# Patient Record
Sex: Female | Born: 1940 | Race: Black or African American | Hispanic: No | Smoking: Former smoker
Health system: Southern US, Community
[De-identification: ages and names within clinical notes are randomized; demographics above are authoritative.]

## PROBLEM LIST (undated history)

## (undated) DIAGNOSIS — M1990 Unspecified osteoarthritis, unspecified site: Secondary | ICD-10-CM

## (undated) DIAGNOSIS — E119 Type 2 diabetes mellitus without complications: Secondary | ICD-10-CM

## (undated) DIAGNOSIS — M545 Low back pain, unspecified: Secondary | ICD-10-CM

## (undated) DIAGNOSIS — H8110 Benign paroxysmal vertigo, unspecified ear: Secondary | ICD-10-CM

## (undated) DIAGNOSIS — Z9289 Personal history of other medical treatment: Secondary | ICD-10-CM

## (undated) DIAGNOSIS — C50919 Malignant neoplasm of unspecified site of unspecified female breast: Secondary | ICD-10-CM

## (undated) DIAGNOSIS — J42 Unspecified chronic bronchitis: Secondary | ICD-10-CM

## (undated) DIAGNOSIS — M8580 Other specified disorders of bone density and structure, unspecified site: Secondary | ICD-10-CM

## (undated) DIAGNOSIS — E785 Hyperlipidemia, unspecified: Secondary | ICD-10-CM

## (undated) DIAGNOSIS — J189 Pneumonia, unspecified organism: Secondary | ICD-10-CM

## (undated) DIAGNOSIS — D649 Anemia, unspecified: Secondary | ICD-10-CM

## (undated) DIAGNOSIS — R0602 Shortness of breath: Secondary | ICD-10-CM

## (undated) DIAGNOSIS — I10 Essential (primary) hypertension: Secondary | ICD-10-CM

## (undated) DIAGNOSIS — Z8719 Personal history of other diseases of the digestive system: Secondary | ICD-10-CM

## (undated) DIAGNOSIS — G8929 Other chronic pain: Secondary | ICD-10-CM

## (undated) DIAGNOSIS — I509 Heart failure, unspecified: Secondary | ICD-10-CM

## (undated) DIAGNOSIS — K219 Gastro-esophageal reflux disease without esophagitis: Secondary | ICD-10-CM

## (undated) DIAGNOSIS — Z9981 Dependence on supplemental oxygen: Secondary | ICD-10-CM

## (undated) DIAGNOSIS — G4730 Sleep apnea, unspecified: Secondary | ICD-10-CM

## (undated) DIAGNOSIS — N3941 Urge incontinence: Secondary | ICD-10-CM

## (undated) DIAGNOSIS — I499 Cardiac arrhythmia, unspecified: Secondary | ICD-10-CM

## (undated) DIAGNOSIS — S52509A Unspecified fracture of the lower end of unspecified radius, initial encounter for closed fracture: Secondary | ICD-10-CM

## (undated) DIAGNOSIS — E559 Vitamin D deficiency, unspecified: Secondary | ICD-10-CM

## (undated) DIAGNOSIS — G43909 Migraine, unspecified, not intractable, without status migrainosus: Secondary | ICD-10-CM

## (undated) HISTORY — DX: Urge incontinence: N39.41

## (undated) HISTORY — PX: TOTAL KNEE ARTHROPLASTY: SHX125

## (undated) HISTORY — PX: JOINT REPLACEMENT: SHX530

## (undated) HISTORY — DX: Gastro-esophageal reflux disease without esophagitis: K21.9

## (undated) HISTORY — PX: MEDIAL PARTIAL KNEE REPLACEMENT: SHX5965

## (undated) HISTORY — PX: TUBAL LIGATION: SHX77

## (undated) HISTORY — DX: Essential (primary) hypertension: I10

## (undated) HISTORY — PX: KNEE ARTHROSCOPY: SUR90

## (undated) HISTORY — DX: Hyperlipidemia, unspecified: E78.5

## (undated) HISTORY — DX: Sleep apnea, unspecified: G47.30

## (undated) HISTORY — DX: Vitamin D deficiency, unspecified: E55.9

## (undated) HISTORY — DX: Cardiac arrhythmia, unspecified: I49.9

## (undated) HISTORY — DX: Malignant neoplasm of unspecified site of unspecified female breast: C50.919

## (undated) HISTORY — DX: Benign paroxysmal vertigo, unspecified ear: H81.10

## (undated) HISTORY — PX: CATARACT EXTRACTION W/ INTRAOCULAR LENS  IMPLANT, BILATERAL: SHX1307

## (undated) HISTORY — DX: Other specified disorders of bone density and structure, unspecified site: M85.80

## (undated) HISTORY — DX: Anemia, unspecified: D64.9

---

## 1998-04-12 ENCOUNTER — Other Ambulatory Visit: Admission: RE | Admit: 1998-04-12 | Discharge: 1998-04-12 | Payer: Self-pay | Admitting: Family Medicine

## 1998-06-25 ENCOUNTER — Other Ambulatory Visit: Admission: RE | Admit: 1998-06-25 | Discharge: 1998-06-25 | Payer: Self-pay | Admitting: Family Medicine

## 1998-07-08 ENCOUNTER — Other Ambulatory Visit: Admission: RE | Admit: 1998-07-08 | Discharge: 1998-07-08 | Payer: Self-pay | Admitting: Family Medicine

## 1998-08-27 ENCOUNTER — Ambulatory Visit (HOSPITAL_COMMUNITY): Admission: RE | Admit: 1998-08-27 | Discharge: 1998-08-27 | Payer: Self-pay | Admitting: Family Medicine

## 1998-08-27 ENCOUNTER — Encounter: Payer: Self-pay | Admitting: Family Medicine

## 1999-01-07 ENCOUNTER — Ambulatory Visit (HOSPITAL_COMMUNITY): Admission: RE | Admit: 1999-01-07 | Discharge: 1999-01-07 | Payer: Self-pay | Admitting: *Deleted

## 1999-04-21 ENCOUNTER — Other Ambulatory Visit: Admission: RE | Admit: 1999-04-21 | Discharge: 1999-04-21 | Payer: Self-pay | Admitting: Family Medicine

## 1999-06-06 ENCOUNTER — Encounter: Payer: Self-pay | Admitting: Orthopaedic Surgery

## 1999-06-09 ENCOUNTER — Inpatient Hospital Stay (HOSPITAL_COMMUNITY): Admission: RE | Admit: 1999-06-09 | Discharge: 1999-06-14 | Payer: Self-pay | Admitting: Orthopaedic Surgery

## 1999-08-13 ENCOUNTER — Encounter: Payer: Self-pay | Admitting: Internal Medicine

## 1999-08-13 ENCOUNTER — Ambulatory Visit (HOSPITAL_COMMUNITY): Admission: RE | Admit: 1999-08-13 | Discharge: 1999-08-13 | Payer: Self-pay | Admitting: Internal Medicine

## 2000-02-28 ENCOUNTER — Ambulatory Visit (HOSPITAL_COMMUNITY): Admission: RE | Admit: 2000-02-28 | Discharge: 2000-02-28 | Payer: Self-pay | Admitting: Family Medicine

## 2000-02-28 ENCOUNTER — Encounter: Payer: Self-pay | Admitting: Family Medicine

## 2000-04-20 ENCOUNTER — Other Ambulatory Visit: Admission: RE | Admit: 2000-04-20 | Discharge: 2000-04-20 | Payer: Self-pay | Admitting: Obstetrics

## 2000-05-15 ENCOUNTER — Encounter: Admission: RE | Admit: 2000-05-15 | Discharge: 2000-05-29 | Payer: Self-pay | Admitting: Orthopaedic Surgery

## 2000-07-22 ENCOUNTER — Emergency Department (HOSPITAL_COMMUNITY): Admission: EM | Admit: 2000-07-22 | Discharge: 2000-07-22 | Payer: Self-pay | Admitting: Emergency Medicine

## 2000-07-22 ENCOUNTER — Encounter: Payer: Self-pay | Admitting: Emergency Medicine

## 2000-07-23 ENCOUNTER — Encounter: Payer: Self-pay | Admitting: Emergency Medicine

## 2000-07-28 ENCOUNTER — Emergency Department (HOSPITAL_COMMUNITY): Admission: EM | Admit: 2000-07-28 | Discharge: 2000-07-28 | Payer: Self-pay | Admitting: Emergency Medicine

## 2000-08-01 ENCOUNTER — Encounter: Admission: RE | Admit: 2000-08-01 | Discharge: 2000-09-18 | Payer: Self-pay | Admitting: Family Medicine

## 2000-09-05 ENCOUNTER — Encounter: Payer: Self-pay | Admitting: Family Medicine

## 2000-09-05 ENCOUNTER — Encounter: Admission: RE | Admit: 2000-09-05 | Discharge: 2000-09-05 | Payer: Self-pay | Admitting: Family Medicine

## 2001-04-26 ENCOUNTER — Encounter: Admission: RE | Admit: 2001-04-26 | Discharge: 2001-04-26 | Payer: Self-pay | Admitting: Orthopaedic Surgery

## 2001-04-26 ENCOUNTER — Encounter: Payer: Self-pay | Admitting: Orthopaedic Surgery

## 2001-05-10 ENCOUNTER — Encounter: Payer: Self-pay | Admitting: Orthopaedic Surgery

## 2001-05-10 ENCOUNTER — Encounter: Admission: RE | Admit: 2001-05-10 | Discharge: 2001-05-10 | Payer: Self-pay | Admitting: Orthopaedic Surgery

## 2001-05-16 ENCOUNTER — Encounter: Admission: RE | Admit: 2001-05-16 | Discharge: 2001-05-16 | Payer: Self-pay | Admitting: Cardiology

## 2001-05-16 ENCOUNTER — Encounter: Payer: Self-pay | Admitting: Cardiology

## 2001-05-24 ENCOUNTER — Encounter: Admission: RE | Admit: 2001-05-24 | Discharge: 2001-05-24 | Payer: Self-pay | Admitting: Orthopaedic Surgery

## 2001-05-24 ENCOUNTER — Encounter: Payer: Self-pay | Admitting: Orthopaedic Surgery

## 2001-06-10 ENCOUNTER — Emergency Department (HOSPITAL_COMMUNITY): Admission: EM | Admit: 2001-06-10 | Discharge: 2001-06-10 | Payer: Self-pay | Admitting: Emergency Medicine

## 2001-07-12 ENCOUNTER — Ambulatory Visit (HOSPITAL_COMMUNITY): Admission: RE | Admit: 2001-07-12 | Discharge: 2001-07-12 | Payer: Self-pay | Admitting: Cardiology

## 2001-07-12 ENCOUNTER — Encounter: Payer: Self-pay | Admitting: Cardiology

## 2002-01-14 ENCOUNTER — Encounter: Payer: Self-pay | Admitting: Neurology

## 2002-01-14 ENCOUNTER — Ambulatory Visit (HOSPITAL_COMMUNITY): Admission: RE | Admit: 2002-01-14 | Discharge: 2002-01-14 | Payer: Self-pay | Admitting: Neurology

## 2002-01-20 ENCOUNTER — Emergency Department (HOSPITAL_COMMUNITY): Admission: EM | Admit: 2002-01-20 | Discharge: 2002-01-20 | Payer: Self-pay | Admitting: Emergency Medicine

## 2002-01-20 ENCOUNTER — Encounter: Payer: Self-pay | Admitting: Emergency Medicine

## 2002-02-05 ENCOUNTER — Encounter: Admission: RE | Admit: 2002-02-05 | Discharge: 2002-02-05 | Payer: Self-pay | Admitting: Cardiology

## 2002-02-05 ENCOUNTER — Encounter: Payer: Self-pay | Admitting: Cardiology

## 2002-04-15 ENCOUNTER — Ambulatory Visit (HOSPITAL_COMMUNITY): Admission: RE | Admit: 2002-04-15 | Discharge: 2002-04-15 | Payer: Self-pay | Admitting: Obstetrics

## 2002-04-15 ENCOUNTER — Encounter: Payer: Self-pay | Admitting: Obstetrics

## 2002-05-30 ENCOUNTER — Encounter: Payer: Self-pay | Admitting: Cardiology

## 2002-05-30 ENCOUNTER — Ambulatory Visit (HOSPITAL_COMMUNITY): Admission: RE | Admit: 2002-05-30 | Discharge: 2002-05-30 | Payer: Self-pay | Admitting: Cardiology

## 2003-01-23 ENCOUNTER — Encounter: Payer: Self-pay | Admitting: Pulmonary Disease

## 2003-01-23 ENCOUNTER — Ambulatory Visit (HOSPITAL_COMMUNITY): Admission: RE | Admit: 2003-01-23 | Discharge: 2003-01-23 | Payer: Self-pay | Admitting: Pulmonary Disease

## 2003-08-07 ENCOUNTER — Emergency Department (HOSPITAL_COMMUNITY): Admission: EM | Admit: 2003-08-07 | Discharge: 2003-08-07 | Payer: Self-pay | Admitting: Emergency Medicine

## 2003-10-08 ENCOUNTER — Ambulatory Visit (HOSPITAL_COMMUNITY): Admission: RE | Admit: 2003-10-08 | Discharge: 2003-10-08 | Payer: Self-pay | Admitting: *Deleted

## 2003-10-20 ENCOUNTER — Encounter: Admission: RE | Admit: 2003-10-20 | Discharge: 2003-10-20 | Payer: Self-pay | Admitting: Cardiology

## 2004-03-02 ENCOUNTER — Ambulatory Visit (HOSPITAL_COMMUNITY): Admission: RE | Admit: 2004-03-02 | Discharge: 2004-03-02 | Payer: Self-pay | Admitting: Cardiology

## 2004-04-25 ENCOUNTER — Encounter: Admission: RE | Admit: 2004-04-25 | Discharge: 2004-05-26 | Payer: Self-pay | Admitting: Orthopaedic Surgery

## 2004-05-30 ENCOUNTER — Encounter: Admission: RE | Admit: 2004-05-30 | Discharge: 2004-05-30 | Payer: Self-pay | Admitting: Cardiology

## 2004-09-29 ENCOUNTER — Inpatient Hospital Stay (HOSPITAL_COMMUNITY): Admission: RE | Admit: 2004-09-29 | Discharge: 2004-10-05 | Payer: Self-pay | Admitting: Orthopaedic Surgery

## 2004-09-29 ENCOUNTER — Ambulatory Visit: Payer: Self-pay | Admitting: Physical Medicine & Rehabilitation

## 2004-11-04 ENCOUNTER — Encounter: Admission: RE | Admit: 2004-11-04 | Discharge: 2005-01-12 | Payer: Self-pay | Admitting: Orthopaedic Surgery

## 2005-03-03 ENCOUNTER — Ambulatory Visit (HOSPITAL_COMMUNITY): Admission: RE | Admit: 2005-03-03 | Discharge: 2005-03-03 | Payer: Self-pay | Admitting: Cardiology

## 2005-04-25 ENCOUNTER — Encounter: Admission: RE | Admit: 2005-04-25 | Discharge: 2005-04-25 | Payer: Self-pay | Admitting: Cardiology

## 2005-05-04 ENCOUNTER — Other Ambulatory Visit: Admission: RE | Admit: 2005-05-04 | Discharge: 2005-05-04 | Payer: Self-pay | Admitting: Obstetrics and Gynecology

## 2005-06-13 ENCOUNTER — Ambulatory Visit: Payer: Self-pay | Admitting: Physical Medicine & Rehabilitation

## 2005-06-13 ENCOUNTER — Inpatient Hospital Stay (HOSPITAL_COMMUNITY): Admission: RE | Admit: 2005-06-13 | Discharge: 2005-06-16 | Payer: Self-pay | Admitting: Orthopaedic Surgery

## 2005-06-16 ENCOUNTER — Inpatient Hospital Stay (HOSPITAL_COMMUNITY)
Admission: RE | Admit: 2005-06-16 | Discharge: 2005-06-22 | Payer: Self-pay | Admitting: Physical Medicine & Rehabilitation

## 2005-07-04 ENCOUNTER — Encounter: Admission: RE | Admit: 2005-07-04 | Discharge: 2005-08-02 | Payer: Self-pay | Admitting: Orthopedic Surgery

## 2005-12-25 DIAGNOSIS — Z9289 Personal history of other medical treatment: Secondary | ICD-10-CM

## 2005-12-25 DIAGNOSIS — C50919 Malignant neoplasm of unspecified site of unspecified female breast: Secondary | ICD-10-CM

## 2005-12-25 HISTORY — DX: Malignant neoplasm of unspecified site of unspecified female breast: C50.919

## 2005-12-25 HISTORY — PX: BREAST BIOPSY: SHX20

## 2005-12-25 HISTORY — DX: Personal history of other medical treatment: Z92.89

## 2005-12-25 HISTORY — PX: BREAST LUMPECTOMY: SHX2

## 2006-04-11 ENCOUNTER — Ambulatory Visit (HOSPITAL_COMMUNITY): Admission: RE | Admit: 2006-04-11 | Discharge: 2006-04-11 | Payer: Self-pay | Admitting: Cardiology

## 2006-04-19 ENCOUNTER — Emergency Department (HOSPITAL_COMMUNITY): Admission: EM | Admit: 2006-04-19 | Discharge: 2006-04-19 | Payer: Self-pay | Admitting: Emergency Medicine

## 2006-04-23 ENCOUNTER — Encounter (INDEPENDENT_AMBULATORY_CARE_PROVIDER_SITE_OTHER): Payer: Self-pay | Admitting: Specialist

## 2006-04-23 ENCOUNTER — Encounter: Admission: RE | Admit: 2006-04-23 | Discharge: 2006-04-23 | Payer: Self-pay | Admitting: Cardiology

## 2006-04-23 ENCOUNTER — Encounter (INDEPENDENT_AMBULATORY_CARE_PROVIDER_SITE_OTHER): Payer: Self-pay | Admitting: Diagnostic Radiology

## 2006-05-16 ENCOUNTER — Encounter: Admission: RE | Admit: 2006-05-16 | Discharge: 2006-05-16 | Payer: Self-pay | Admitting: General Surgery

## 2006-05-25 ENCOUNTER — Other Ambulatory Visit: Admission: RE | Admit: 2006-05-25 | Discharge: 2006-05-25 | Payer: Self-pay | Admitting: Obstetrics and Gynecology

## 2006-05-30 ENCOUNTER — Encounter (INDEPENDENT_AMBULATORY_CARE_PROVIDER_SITE_OTHER): Payer: Self-pay | Admitting: *Deleted

## 2006-05-30 ENCOUNTER — Ambulatory Visit (HOSPITAL_COMMUNITY): Admission: RE | Admit: 2006-05-30 | Discharge: 2006-05-31 | Payer: Self-pay | Admitting: General Surgery

## 2006-05-30 ENCOUNTER — Encounter: Admission: RE | Admit: 2006-05-30 | Discharge: 2006-05-30 | Payer: Self-pay | Admitting: General Surgery

## 2006-06-07 ENCOUNTER — Ambulatory Visit: Payer: Self-pay | Admitting: Oncology

## 2006-06-13 LAB — COMPREHENSIVE METABOLIC PANEL
AST: 12 U/L (ref 0–37)
Albumin: 3.3 g/dL — ABNORMAL LOW (ref 3.5–5.2)
Alkaline Phosphatase: 98 U/L (ref 39–117)
BUN: 19 mg/dL (ref 6–23)
Calcium: 8.6 mg/dL (ref 8.4–10.5)
Chloride: 105 mEq/L (ref 96–112)
Creatinine, Ser: 1.16 mg/dL (ref 0.40–1.20)
Glucose, Bld: 133 mg/dL — ABNORMAL HIGH (ref 70–99)

## 2006-06-13 LAB — CBC WITH DIFFERENTIAL/PLATELET
Basophils Absolute: 0 10*3/uL (ref 0.0–0.1)
Eosinophils Absolute: 0.3 10*3/uL (ref 0.0–0.5)
HCT: 31.4 % — ABNORMAL LOW (ref 34.8–46.6)
HGB: 10.1 g/dL — ABNORMAL LOW (ref 11.6–15.9)
MONO#: 0.3 10*3/uL (ref 0.1–0.9)
NEUT#: 3.8 10*3/uL (ref 1.5–6.5)
NEUT%: 70.1 % (ref 39.6–76.8)
RDW: 15.3 % — ABNORMAL HIGH (ref 11.3–14.5)
lymph#: 1.1 10*3/uL (ref 0.9–3.3)

## 2006-06-13 LAB — CANCER ANTIGEN 27.29: CA 27.29: 42 U/mL — ABNORMAL HIGH (ref 0–39)

## 2006-06-18 ENCOUNTER — Encounter: Admission: RE | Admit: 2006-06-18 | Discharge: 2006-06-18 | Payer: Self-pay | Admitting: Cardiology

## 2006-06-20 ENCOUNTER — Ambulatory Visit (HOSPITAL_COMMUNITY): Admission: RE | Admit: 2006-06-20 | Discharge: 2006-06-20 | Payer: Self-pay | Admitting: Cardiology

## 2006-06-21 ENCOUNTER — Ambulatory Visit (HOSPITAL_COMMUNITY): Admission: RE | Admit: 2006-06-21 | Discharge: 2006-06-21 | Payer: Self-pay | Admitting: Oncology

## 2006-06-22 ENCOUNTER — Encounter (HOSPITAL_COMMUNITY): Admission: RE | Admit: 2006-06-22 | Discharge: 2006-09-05 | Payer: Self-pay | Admitting: Cardiology

## 2006-06-26 ENCOUNTER — Ambulatory Visit: Admission: RE | Admit: 2006-06-26 | Discharge: 2006-06-26 | Payer: Self-pay | Admitting: Oncology

## 2006-06-26 ENCOUNTER — Encounter (INDEPENDENT_AMBULATORY_CARE_PROVIDER_SITE_OTHER): Payer: Self-pay | Admitting: Cardiology

## 2006-06-28 LAB — CBC WITH DIFFERENTIAL/PLATELET
BASO%: 0.5 % (ref 0.0–2.0)
EOS%: 5.8 % (ref 0.0–7.0)
HCT: 34.2 % — ABNORMAL LOW (ref 34.8–46.6)
MCH: 26.6 pg (ref 26.0–34.0)
MCHC: 32.1 g/dL (ref 32.0–36.0)
NEUT%: 57.3 % (ref 39.6–76.8)
RDW: 16.1 % — ABNORMAL HIGH (ref 11.3–14.5)
lymph#: 1.3 10*3/uL (ref 0.9–3.3)

## 2006-06-29 ENCOUNTER — Ambulatory Visit (HOSPITAL_COMMUNITY): Admission: RE | Admit: 2006-06-29 | Discharge: 2006-06-29 | Payer: Self-pay | Admitting: General Surgery

## 2006-06-29 LAB — IRON AND TIBC: UIBC: 287 ug/dL

## 2006-06-29 LAB — ERYTHROPOIETIN: Erythropoietin: 24.3 m[IU]/mL (ref 2.6–34.0)

## 2006-06-29 LAB — FOLATE RBC: RBC Folate: 418 ng/mL (ref 180–600)

## 2006-07-02 LAB — CBC WITH DIFFERENTIAL/PLATELET
BASO%: 0.6 % (ref 0.0–2.0)
HCT: 34.1 % — ABNORMAL LOW (ref 34.8–46.6)
LYMPH%: 28.8 % (ref 14.0–48.0)
MCH: 26 pg (ref 26.0–34.0)
MCHC: 30.7 g/dL — ABNORMAL LOW (ref 32.0–36.0)
MCV: 84.9 fL (ref 81.0–101.0)
MONO%: 9.4 % (ref 0.0–13.0)
NEUT%: 54.5 % (ref 39.6–76.8)
Platelets: 200 10*3/uL (ref 145–400)
RBC: 4.02 10*6/uL (ref 3.70–5.32)
WBC: 5.8 10*3/uL (ref 3.9–10.0)

## 2006-07-12 LAB — CBC WITH DIFFERENTIAL/PLATELET
BASO%: 1.6 % (ref 0.0–2.0)
EOS%: 6.5 % (ref 0.0–7.0)
HCT: 31.3 % — ABNORMAL LOW (ref 34.8–46.6)
LYMPH%: 55 % — ABNORMAL HIGH (ref 14.0–48.0)
MCH: 25.7 pg — ABNORMAL LOW (ref 26.0–34.0)
MCHC: 30.6 g/dL — ABNORMAL LOW (ref 32.0–36.0)
MONO#: 0.1 10*3/uL (ref 0.1–0.9)
NEUT%: 32.9 % — ABNORMAL LOW (ref 39.6–76.8)
RBC: 3.74 10*6/uL (ref 3.70–5.32)
WBC: 1.3 10*3/uL — ABNORMAL LOW (ref 3.9–10.0)
lymph#: 0.7 10*3/uL — ABNORMAL LOW (ref 0.9–3.3)

## 2006-07-23 ENCOUNTER — Ambulatory Visit: Payer: Self-pay | Admitting: Oncology

## 2006-07-23 LAB — CBC WITH DIFFERENTIAL/PLATELET
BASO%: 1.4 % (ref 0.0–2.0)
EOS%: 0.9 % (ref 0.0–7.0)
HCT: 34.1 % — ABNORMAL LOW (ref 34.8–46.6)
HGB: 10.4 g/dL — ABNORMAL LOW (ref 11.6–15.9)
MCH: 26.4 pg (ref 26.0–34.0)
MCHC: 30.6 g/dL — ABNORMAL LOW (ref 32.0–36.0)
MONO#: 0.7 10*3/uL (ref 0.1–0.9)
RDW: 16.1 % — ABNORMAL HIGH (ref 11.3–14.5)
WBC: 4.6 10*3/uL (ref 3.9–10.0)
lymph#: 1.5 10*3/uL (ref 0.9–3.3)

## 2006-08-01 ENCOUNTER — Ambulatory Visit: Payer: Self-pay | Admitting: Oncology

## 2006-08-01 LAB — CBC WITH DIFFERENTIAL/PLATELET
Basophils Absolute: 0 10*3/uL (ref 0.0–0.1)
EOS%: 2.7 % (ref 0.0–7.0)
Eosinophils Absolute: 0 10*3/uL (ref 0.0–0.5)
HCT: 27.1 % — ABNORMAL LOW (ref 34.8–46.6)
HGB: 8.8 g/dL — ABNORMAL LOW (ref 11.6–15.9)
MCH: 26.2 pg (ref 26.0–34.0)
MCV: 80.8 fL — ABNORMAL LOW (ref 81.0–101.0)
NEUT%: 54.8 % (ref 39.6–76.8)
lymph#: 0.6 10*3/uL — ABNORMAL LOW (ref 0.9–3.3)

## 2006-08-13 LAB — CBC WITH DIFFERENTIAL/PLATELET
Basophils Absolute: 0 10*3/uL (ref 0.0–0.1)
EOS%: 1.7 % (ref 0.0–7.0)
HGB: 10.6 g/dL — ABNORMAL LOW (ref 11.6–15.9)
LYMPH%: 32.6 % (ref 14.0–48.0)
MCH: 26 pg (ref 26.0–34.0)
MCV: 85.6 fL (ref 81.0–101.0)
MONO%: 21.3 % — ABNORMAL HIGH (ref 0.0–13.0)
NEUT%: 43.7 % (ref 39.6–76.8)
Platelets: 348 10*3/uL (ref 145–400)
RDW: 18 % — ABNORMAL HIGH (ref 11.3–14.5)

## 2006-08-20 LAB — COMPREHENSIVE METABOLIC PANEL
AST: 11 U/L (ref 0–37)
Albumin: 3.6 g/dL (ref 3.5–5.2)
Alkaline Phosphatase: 82 U/L (ref 39–117)
Potassium: 4.5 mEq/L (ref 3.5–5.3)
Sodium: 142 mEq/L (ref 135–145)
Total Bilirubin: 0.4 mg/dL (ref 0.3–1.2)
Total Protein: 7 g/dL (ref 6.0–8.3)

## 2006-08-20 LAB — CBC WITH DIFFERENTIAL/PLATELET
EOS%: 1.1 % (ref 0.0–7.0)
LYMPH%: 25.8 % (ref 14.0–48.0)
MCH: 26 pg (ref 26.0–34.0)
MCHC: 32.1 g/dL (ref 32.0–36.0)
MCV: 80.8 fL — ABNORMAL LOW (ref 81.0–101.0)
MONO%: 1.3 % (ref 0.0–13.0)
Platelets: 284 10*3/uL (ref 145–400)
RBC: 3.72 10*6/uL (ref 3.70–5.32)
RDW: 17.2 % — ABNORMAL HIGH (ref 11.3–14.5)

## 2006-09-03 LAB — CBC WITH DIFFERENTIAL/PLATELET
Basophils Absolute: 0 10*3/uL (ref 0.0–0.1)
EOS%: 1.1 % (ref 0.0–7.0)
Eosinophils Absolute: 0 10*3/uL (ref 0.0–0.5)
HGB: 10.1 g/dL — ABNORMAL LOW (ref 11.6–15.9)
LYMPH%: 29.1 % (ref 14.0–48.0)
MCH: 25.7 pg — ABNORMAL LOW (ref 26.0–34.0)
MCV: 83.3 fL (ref 81.0–101.0)
MONO%: 20 % — ABNORMAL HIGH (ref 0.0–13.0)
NEUT#: 1.4 10*3/uL — ABNORMAL LOW (ref 1.5–6.5)
Platelets: 280 10*3/uL (ref 145–400)
RBC: 3.94 10*6/uL (ref 3.70–5.32)
RDW: 18.5 % — ABNORMAL HIGH (ref 11.3–14.5)

## 2006-09-10 LAB — CBC WITH DIFFERENTIAL/PLATELET
BASO%: 1.9 % (ref 0.0–2.0)
EOS%: 4.6 % (ref 0.0–7.0)
LYMPH%: 60.2 % — ABNORMAL HIGH (ref 14.0–48.0)
MCHC: 32.6 g/dL (ref 32.0–36.0)
MCV: 80.9 fL — ABNORMAL LOW (ref 81.0–101.0)
MONO%: 16.6 % — ABNORMAL HIGH (ref 0.0–13.0)
NEUT#: 0.1 10*3/uL — CL (ref 1.5–6.5)
Platelets: 100 10*3/uL — ABNORMAL LOW (ref 145–400)
RBC: 3.71 10*6/uL (ref 3.70–5.32)
RDW: 19.3 % — ABNORMAL HIGH (ref 11.3–14.5)

## 2006-09-13 ENCOUNTER — Ambulatory Visit: Payer: Self-pay | Admitting: Oncology

## 2006-09-17 LAB — CBC WITH DIFFERENTIAL/PLATELET
BASO%: 0.9 % (ref 0.0–2.0)
EOS%: 1 % (ref 0.0–7.0)
HCT: 29.9 % — ABNORMAL LOW (ref 34.8–46.6)
LYMPH%: 14.6 % (ref 14.0–48.0)
MCH: 25.6 pg — ABNORMAL LOW (ref 26.0–34.0)
MCHC: 32 g/dL (ref 32.0–36.0)
NEUT%: 72.8 % (ref 39.6–76.8)
Platelets: 150 10*3/uL (ref 145–400)

## 2006-09-25 ENCOUNTER — Encounter (HOSPITAL_COMMUNITY): Admission: RE | Admit: 2006-09-25 | Discharge: 2006-12-24 | Payer: Self-pay | Admitting: Cardiology

## 2006-10-25 ENCOUNTER — Ambulatory Visit: Payer: Self-pay | Admitting: Oncology

## 2006-10-29 LAB — CBC WITH DIFFERENTIAL/PLATELET
BASO%: 1 % (ref 0.0–2.0)
Eosinophils Absolute: 0.1 10*3/uL (ref 0.0–0.5)
LYMPH%: 17 % (ref 14.0–48.0)
MCHC: 30.7 g/dL — ABNORMAL LOW (ref 32.0–36.0)
MONO#: 0.3 10*3/uL (ref 0.1–0.9)
NEUT#: 4.5 10*3/uL (ref 1.5–6.5)
RBC: 3.99 10*6/uL (ref 3.70–5.32)
RDW: 22.2 % — ABNORMAL HIGH (ref 11.3–14.5)
WBC: 6 10*3/uL (ref 3.9–10.0)
lymph#: 1 10*3/uL (ref 0.9–3.3)

## 2006-11-09 LAB — CBC WITH DIFFERENTIAL/PLATELET
BASO%: 0.7 % (ref 0.0–2.0)
Eosinophils Absolute: 0.1 10*3/uL (ref 0.0–0.5)
HCT: 40.8 % (ref 34.8–46.6)
MCHC: 29.7 g/dL — ABNORMAL LOW (ref 32.0–36.0)
MONO#: 0.5 10*3/uL (ref 0.1–0.9)
NEUT#: 2.6 10*3/uL (ref 1.5–6.5)
NEUT%: 63.3 % (ref 39.6–76.8)
Platelets: 205 10*3/uL (ref 145–400)
WBC: 4.1 10*3/uL (ref 3.9–10.0)
lymph#: 0.9 10*3/uL (ref 0.9–3.3)

## 2006-11-16 LAB — CBC WITH DIFFERENTIAL/PLATELET
Basophils Absolute: 0 10*3/uL (ref 0.0–0.1)
HCT: 36.1 % (ref 34.8–46.6)
HGB: 10.9 g/dL — ABNORMAL LOW (ref 11.6–15.9)
LYMPH%: 27.6 % (ref 14.0–48.0)
MCH: 27.2 pg (ref 26.0–34.0)
MONO#: 0.2 10*3/uL (ref 0.1–0.9)
NEUT%: 64.1 % (ref 39.6–76.8)
Platelets: 212 10*3/uL (ref 145–400)
WBC: 3.7 10*3/uL — ABNORMAL LOW (ref 3.9–10.0)
lymph#: 1 10*3/uL (ref 0.9–3.3)

## 2006-11-23 LAB — CBC WITH DIFFERENTIAL/PLATELET
Basophils Absolute: 0 10*3/uL (ref 0.0–0.1)
EOS%: 1.4 % (ref 0.0–7.0)
HCT: 31.7 % — ABNORMAL LOW (ref 34.8–46.6)
HGB: 10.1 g/dL — ABNORMAL LOW (ref 11.6–15.9)
LYMPH%: 28.9 % (ref 14.0–48.0)
MCH: 28 pg (ref 26.0–34.0)
MCV: 87.4 fL (ref 81.0–101.0)
MONO%: 5.8 % (ref 0.0–13.0)
NEUT%: 63.4 % (ref 39.6–76.8)

## 2006-11-23 LAB — COMPREHENSIVE METABOLIC PANEL
AST: 17 U/L (ref 0–37)
Alkaline Phosphatase: 72 U/L (ref 39–117)
BUN: 13 mg/dL (ref 6–23)
Calcium: 8.9 mg/dL (ref 8.4–10.5)
Creatinine, Ser: 0.76 mg/dL (ref 0.40–1.20)
Total Bilirubin: 0.6 mg/dL (ref 0.3–1.2)

## 2006-11-30 LAB — CBC WITH DIFFERENTIAL/PLATELET
BASO%: 0.7 % (ref 0.0–2.0)
EOS%: 1.6 % (ref 0.0–7.0)
MCH: 27 pg (ref 26.0–34.0)
MCHC: 30.3 g/dL — ABNORMAL LOW (ref 32.0–36.0)
MONO#: 0.4 10*3/uL (ref 0.1–0.9)
NEUT%: 58.1 % (ref 39.6–76.8)
RBC: 4.15 10*6/uL (ref 3.70–5.32)
RDW: 19.4 % — ABNORMAL HIGH (ref 11.3–14.5)
WBC: 4.3 10*3/uL (ref 3.9–10.0)
lymph#: 1.3 10*3/uL (ref 0.9–3.3)

## 2006-12-06 ENCOUNTER — Ambulatory Visit: Payer: Self-pay | Admitting: Oncology

## 2006-12-10 LAB — CBC WITH DIFFERENTIAL/PLATELET
BASO%: 0.4 % (ref 0.0–2.0)
Basophils Absolute: 0 10*3/uL (ref 0.0–0.1)
EOS%: 0.9 % (ref 0.0–7.0)
HCT: 34.4 % — ABNORMAL LOW (ref 34.8–46.6)
HGB: 10.8 g/dL — ABNORMAL LOW (ref 11.6–15.9)
MCH: 27.8 pg (ref 26.0–34.0)
MONO#: 0.3 10*3/uL (ref 0.1–0.9)
NEUT%: 68.4 % (ref 39.6–76.8)
RDW: 18.7 % — ABNORMAL HIGH (ref 11.3–14.5)
WBC: 5 10*3/uL (ref 3.9–10.0)
lymph#: 1.2 10*3/uL (ref 0.9–3.3)

## 2006-12-21 LAB — CBC WITH DIFFERENTIAL/PLATELET
Basophils Absolute: 0 10*3/uL (ref 0.0–0.1)
Eosinophils Absolute: 0 10*3/uL (ref 0.0–0.5)
HCT: 34.6 % — ABNORMAL LOW (ref 34.8–46.6)
HGB: 11 g/dL — ABNORMAL LOW (ref 11.6–15.9)
NEUT#: 2.9 10*3/uL (ref 1.5–6.5)
NEUT%: 65 % (ref 39.6–76.8)
RDW: 17.9 % — ABNORMAL HIGH (ref 11.3–14.5)
lymph#: 1.1 10*3/uL (ref 0.9–3.3)

## 2006-12-28 LAB — CBC WITH DIFFERENTIAL/PLATELET
BASO%: 0.9 % (ref 0.0–2.0)
Eosinophils Absolute: 0.1 10*3/uL (ref 0.0–0.5)
LYMPH%: 39.9 % (ref 14.0–48.0)
MCHC: 31.8 g/dL — ABNORMAL LOW (ref 32.0–36.0)
MONO#: 0.3 10*3/uL (ref 0.1–0.9)
NEUT#: 1.8 10*3/uL (ref 1.5–6.5)
Platelets: 237 10*3/uL (ref 145–400)
RBC: 3.89 10*6/uL (ref 3.70–5.32)
WBC: 3.6 10*3/uL — ABNORMAL LOW (ref 3.9–10.0)
lymph#: 1.4 10*3/uL (ref 0.9–3.3)

## 2007-01-04 ENCOUNTER — Ambulatory Visit: Admission: RE | Admit: 2007-01-04 | Discharge: 2007-03-20 | Payer: Self-pay | Admitting: *Deleted

## 2007-01-04 LAB — CBC WITH DIFFERENTIAL/PLATELET
BASO%: 1 % (ref 0.0–2.0)
Basophils Absolute: 0 10*3/uL (ref 0.0–0.1)
Eosinophils Absolute: 0.1 10*3/uL (ref 0.0–0.5)
HCT: 35.2 % (ref 34.8–46.6)
HGB: 11 g/dL — ABNORMAL LOW (ref 11.6–15.9)
LYMPH%: 43.8 % (ref 14.0–48.0)
MONO#: 0.1 10*3/uL (ref 0.1–0.9)
NEUT#: 1.3 10*3/uL — ABNORMAL LOW (ref 1.5–6.5)
NEUT%: 48 % (ref 39.6–76.8)
Platelets: 165 10*3/uL (ref 145–400)
WBC: 2.6 10*3/uL — ABNORMAL LOW (ref 3.9–10.0)
lymph#: 1.2 10*3/uL (ref 0.9–3.3)

## 2007-01-04 LAB — COMPREHENSIVE METABOLIC PANEL
ALT: 11 U/L (ref 0–35)
AST: 12 U/L (ref 0–37)
CO2: 24 mEq/L (ref 19–32)
Calcium: 8.9 mg/dL (ref 8.4–10.5)
Chloride: 108 mEq/L (ref 96–112)
Creatinine, Ser: 0.78 mg/dL (ref 0.40–1.20)
Sodium: 139 mEq/L (ref 135–145)
Total Protein: 6.8 g/dL (ref 6.0–8.3)

## 2007-01-21 ENCOUNTER — Ambulatory Visit: Payer: Self-pay | Admitting: Oncology

## 2007-01-23 LAB — CBC WITH DIFFERENTIAL/PLATELET
Basophils Absolute: 0 10*3/uL (ref 0.0–0.1)
Eosinophils Absolute: 0 10*3/uL (ref 0.0–0.5)
HCT: 34.2 % — ABNORMAL LOW (ref 34.8–46.6)
MCH: 28.5 pg (ref 26.0–34.0)
MONO#: 0.4 10*3/uL (ref 0.1–0.9)
MONO%: 9.5 % (ref 0.0–13.0)
Platelets: 225 10*3/uL (ref 145–400)
RDW: 19.6 % — ABNORMAL HIGH (ref 11.3–14.5)
WBC: 4.3 10*3/uL (ref 3.9–10.0)

## 2007-01-23 LAB — COMPREHENSIVE METABOLIC PANEL
Albumin: 3.9 g/dL (ref 3.5–5.2)
Alkaline Phosphatase: 73 U/L (ref 39–117)
BUN: 12 mg/dL (ref 6–23)
CO2: 26 mEq/L (ref 19–32)
Calcium: 9 mg/dL (ref 8.4–10.5)
Chloride: 105 mEq/L (ref 96–112)
Glucose, Bld: 108 mg/dL — ABNORMAL HIGH (ref 70–99)
Potassium: 3.6 mEq/L (ref 3.5–5.3)

## 2007-01-23 LAB — CANCER ANTIGEN 27.29: CA 27.29: 78 U/mL — ABNORMAL HIGH (ref 0–39)

## 2007-01-23 LAB — LACTATE DEHYDROGENASE: LDH: 205 U/L (ref 94–250)

## 2007-03-04 ENCOUNTER — Ambulatory Visit: Payer: Self-pay | Admitting: Oncology

## 2007-03-06 LAB — COMPREHENSIVE METABOLIC PANEL
Albumin: 3.8 g/dL (ref 3.5–5.2)
Alkaline Phosphatase: 80 U/L (ref 39–117)
CO2: 28 mEq/L (ref 19–32)
Calcium: 9.1 mg/dL (ref 8.4–10.5)
Chloride: 104 mEq/L (ref 96–112)
Glucose, Bld: 117 mg/dL — ABNORMAL HIGH (ref 70–99)
Potassium: 4.5 mEq/L (ref 3.5–5.3)
Sodium: 141 mEq/L (ref 135–145)
Total Protein: 7.4 g/dL (ref 6.0–8.3)

## 2007-03-06 LAB — CBC WITH DIFFERENTIAL/PLATELET
BASO%: 0.3 % (ref 0.0–2.0)
EOS%: 1.2 % (ref 0.0–7.0)
MCH: 28.5 pg (ref 26.0–34.0)
MCHC: 32.9 g/dL (ref 32.0–36.0)
MONO#: 0.4 10*3/uL (ref 0.1–0.9)
RBC: 4.02 10*6/uL (ref 3.70–5.32)
WBC: 4.2 10*3/uL (ref 3.9–10.0)
lymph#: 0.7 10*3/uL — ABNORMAL LOW (ref 0.9–3.3)

## 2007-03-06 LAB — LACTATE DEHYDROGENASE: LDH: 178 U/L (ref 94–250)

## 2007-04-03 ENCOUNTER — Ambulatory Visit (HOSPITAL_BASED_OUTPATIENT_CLINIC_OR_DEPARTMENT_OTHER): Admission: RE | Admit: 2007-04-03 | Discharge: 2007-04-03 | Payer: Self-pay | Admitting: General Surgery

## 2007-04-18 ENCOUNTER — Encounter: Admission: RE | Admit: 2007-04-18 | Discharge: 2007-04-18 | Payer: Self-pay | Admitting: Cardiology

## 2007-06-19 ENCOUNTER — Encounter: Admission: RE | Admit: 2007-06-19 | Discharge: 2007-06-19 | Payer: Self-pay | Admitting: Obstetrics and Gynecology

## 2007-06-20 ENCOUNTER — Ambulatory Visit: Payer: Self-pay | Admitting: Oncology

## 2007-06-24 ENCOUNTER — Encounter: Admission: RE | Admit: 2007-06-24 | Discharge: 2007-06-24 | Payer: Self-pay | Admitting: Obstetrics and Gynecology

## 2007-07-08 ENCOUNTER — Ambulatory Visit (HOSPITAL_COMMUNITY): Admission: RE | Admit: 2007-07-08 | Discharge: 2007-07-08 | Payer: Self-pay | Admitting: Gastroenterology

## 2007-07-10 LAB — COMPREHENSIVE METABOLIC PANEL
ALT: 8 U/L (ref 0–35)
Alkaline Phosphatase: 79 U/L (ref 39–117)
CO2: 25 mEq/L (ref 19–32)
Sodium: 141 mEq/L (ref 135–145)
Total Bilirubin: 0.5 mg/dL (ref 0.3–1.2)
Total Protein: 7.2 g/dL (ref 6.0–8.3)

## 2007-07-10 LAB — CBC WITH DIFFERENTIAL/PLATELET
BASO%: 0.5 % (ref 0.0–2.0)
LYMPH%: 29.7 % (ref 14.0–48.0)
MCHC: 32.7 g/dL (ref 32.0–36.0)
MONO#: 0.3 10*3/uL (ref 0.1–0.9)
MONO%: 10.2 % (ref 0.0–13.0)
Platelets: 196 10*3/uL (ref 145–400)
RBC: 4.05 10*6/uL (ref 3.70–5.32)
RDW: 16 % — ABNORMAL HIGH (ref 11.3–14.5)
WBC: 3.3 10*3/uL — ABNORMAL LOW (ref 3.9–10.0)

## 2007-07-10 LAB — CANCER ANTIGEN 27.29: CA 27.29: 54 U/mL — ABNORMAL HIGH (ref 0–39)

## 2007-08-18 ENCOUNTER — Ambulatory Visit: Payer: Self-pay | Admitting: Oncology

## 2007-08-20 LAB — CBC WITH DIFFERENTIAL/PLATELET
BASO%: 0.3 % (ref 0.0–2.0)
EOS%: 2.2 % (ref 0.0–7.0)
LYMPH%: 20.8 % (ref 14.0–48.0)
MCH: 28.9 pg (ref 26.0–34.0)
MCHC: 33.2 g/dL (ref 32.0–36.0)
MONO#: 0.3 10*3/uL (ref 0.1–0.9)
Platelets: 215 10*3/uL (ref 145–400)
RBC: 3.97 10*6/uL (ref 3.70–5.32)
WBC: 4.7 10*3/uL (ref 3.9–10.0)

## 2007-08-20 LAB — COMPREHENSIVE METABOLIC PANEL
ALT: 8 U/L (ref 0–35)
AST: 13 U/L (ref 0–37)
Alkaline Phosphatase: 72 U/L (ref 39–117)
CO2: 26 mEq/L (ref 19–32)
Creatinine, Ser: 0.91 mg/dL (ref 0.40–1.20)
Sodium: 139 mEq/L (ref 135–145)
Total Bilirubin: 0.4 mg/dL (ref 0.3–1.2)
Total Protein: 7.1 g/dL (ref 6.0–8.3)

## 2007-08-20 LAB — LACTATE DEHYDROGENASE: LDH: 163 U/L (ref 94–250)

## 2007-10-04 ENCOUNTER — Ambulatory Visit: Payer: Self-pay | Admitting: Oncology

## 2007-10-08 LAB — COMPREHENSIVE METABOLIC PANEL
ALT: 8 U/L (ref 0–35)
AST: 13 U/L (ref 0–37)
Albumin: 3.9 g/dL (ref 3.5–5.2)
BUN: 20 mg/dL (ref 6–23)
Calcium: 9.3 mg/dL (ref 8.4–10.5)
Chloride: 104 mEq/L (ref 96–112)
Potassium: 4.6 mEq/L (ref 3.5–5.3)
Sodium: 141 mEq/L (ref 135–145)
Total Protein: 7.2 g/dL (ref 6.0–8.3)

## 2007-10-08 LAB — CBC WITH DIFFERENTIAL/PLATELET
Basophils Absolute: 0 10*3/uL (ref 0.0–0.1)
EOS%: 2.1 % (ref 0.0–7.0)
HGB: 11.4 g/dL — ABNORMAL LOW (ref 11.6–15.9)
MCH: 28.7 pg (ref 26.0–34.0)
NEUT#: 2.4 10*3/uL (ref 1.5–6.5)
RDW: 16.5 % — ABNORMAL HIGH (ref 11.3–14.5)
lymph#: 1 10*3/uL (ref 0.9–3.3)

## 2007-12-27 ENCOUNTER — Emergency Department (HOSPITAL_COMMUNITY): Admission: EM | Admit: 2007-12-27 | Discharge: 2007-12-27 | Payer: Self-pay | Admitting: Emergency Medicine

## 2008-02-06 ENCOUNTER — Encounter: Admission: RE | Admit: 2008-02-06 | Discharge: 2008-02-06 | Payer: Self-pay | Admitting: Cardiology

## 2008-02-06 ENCOUNTER — Ambulatory Visit: Payer: Self-pay | Admitting: Oncology

## 2008-02-10 LAB — CBC WITH DIFFERENTIAL/PLATELET
Basophils Absolute: 0 10*3/uL (ref 0.0–0.1)
Eosinophils Absolute: 0.1 10*3/uL (ref 0.0–0.5)
HGB: 11.9 g/dL (ref 11.6–15.9)
MCV: 85.9 fL (ref 81.0–101.0)
MONO#: 0.3 10*3/uL (ref 0.1–0.9)
MONO%: 10 % (ref 0.0–13.0)
NEUT#: 1.9 10*3/uL (ref 1.5–6.5)
RBC: 4.09 10*6/uL (ref 3.70–5.32)
RDW: 16.6 % — ABNORMAL HIGH (ref 11.3–14.5)
WBC: 3.5 10*3/uL — ABNORMAL LOW (ref 3.9–10.0)

## 2008-02-10 LAB — COMPREHENSIVE METABOLIC PANEL
Albumin: 3.9 g/dL (ref 3.5–5.2)
Alkaline Phosphatase: 80 U/L (ref 39–117)
BUN: 19 mg/dL (ref 6–23)
CO2: 26 mEq/L (ref 19–32)
Calcium: 9.4 mg/dL (ref 8.4–10.5)
Chloride: 105 mEq/L (ref 96–112)
Glucose, Bld: 120 mg/dL — ABNORMAL HIGH (ref 70–99)
Potassium: 4.2 mEq/L (ref 3.5–5.3)
Sodium: 141 mEq/L (ref 135–145)
Total Protein: 7.5 g/dL (ref 6.0–8.3)

## 2008-02-10 LAB — CANCER ANTIGEN 27.29: CA 27.29: 50 U/mL — ABNORMAL HIGH (ref 0–39)

## 2008-02-10 LAB — LACTATE DEHYDROGENASE: LDH: 167 U/L (ref 94–250)

## 2008-06-12 ENCOUNTER — Ambulatory Visit: Payer: Self-pay | Admitting: Oncology

## 2008-06-19 ENCOUNTER — Encounter: Admission: RE | Admit: 2008-06-19 | Discharge: 2008-06-19 | Payer: Self-pay | Admitting: Oncology

## 2008-06-22 LAB — COMPREHENSIVE METABOLIC PANEL
AST: 14 U/L (ref 0–37)
Albumin: 3.9 g/dL (ref 3.5–5.2)
Alkaline Phosphatase: 83 U/L (ref 39–117)
BUN: 20 mg/dL (ref 6–23)
Potassium: 3.7 mEq/L (ref 3.5–5.3)
Sodium: 142 mEq/L (ref 135–145)
Total Bilirubin: 0.4 mg/dL (ref 0.3–1.2)
Total Protein: 7.3 g/dL (ref 6.0–8.3)

## 2008-06-22 LAB — CBC WITH DIFFERENTIAL/PLATELET
EOS%: 3.4 % (ref 0.0–7.0)
LYMPH%: 22.9 % (ref 14.0–48.0)
MCH: 28.4 pg (ref 26.0–34.0)
MCHC: 33.3 g/dL (ref 32.0–36.0)
MCV: 85.4 fL (ref 81.0–101.0)
MONO%: 6 % (ref 0.0–13.0)
RBC: 4.18 10*6/uL (ref 3.70–5.32)
RDW: 15.7 % — ABNORMAL HIGH (ref 11.3–14.5)

## 2008-06-25 LAB — CELL SEARCH FOR BREAST CANCER

## 2008-06-30 ENCOUNTER — Ambulatory Visit (HOSPITAL_COMMUNITY): Admission: RE | Admit: 2008-06-30 | Discharge: 2008-06-30 | Payer: Self-pay | Admitting: Oncology

## 2008-07-15 ENCOUNTER — Emergency Department (HOSPITAL_COMMUNITY): Admission: EM | Admit: 2008-07-15 | Discharge: 2008-07-15 | Payer: Self-pay | Admitting: Emergency Medicine

## 2008-07-16 ENCOUNTER — Encounter: Admission: RE | Admit: 2008-07-16 | Discharge: 2008-07-16 | Payer: Self-pay | Admitting: Family Medicine

## 2008-07-22 ENCOUNTER — Encounter: Admission: RE | Admit: 2008-07-22 | Discharge: 2008-08-20 | Payer: Self-pay | Admitting: Family Medicine

## 2008-10-15 ENCOUNTER — Encounter: Admission: RE | Admit: 2008-10-15 | Discharge: 2008-10-15 | Payer: Self-pay | Admitting: Family Medicine

## 2008-11-18 ENCOUNTER — Ambulatory Visit: Payer: Self-pay | Admitting: Oncology

## 2008-11-23 LAB — CBC WITH DIFFERENTIAL/PLATELET
Basophils Absolute: 0 10*3/uL (ref 0.0–0.1)
EOS%: 2.5 % (ref 0.0–7.0)
HCT: 33.3 % — ABNORMAL LOW (ref 34.8–46.6)
HGB: 10.8 g/dL — ABNORMAL LOW (ref 11.6–15.9)
MCH: 28.3 pg (ref 26.0–34.0)
NEUT%: 60 % (ref 39.6–76.8)
lymph#: 1.1 10*3/uL (ref 0.9–3.3)

## 2008-11-24 LAB — COMPREHENSIVE METABOLIC PANEL
AST: 14 U/L (ref 0–37)
BUN: 24 mg/dL — ABNORMAL HIGH (ref 6–23)
CO2: 27 mEq/L (ref 19–32)
Calcium: 9.2 mg/dL (ref 8.4–10.5)
Chloride: 103 mEq/L (ref 96–112)
Creatinine, Ser: 0.91 mg/dL (ref 0.40–1.20)
Glucose, Bld: 90 mg/dL (ref 70–99)

## 2008-11-24 LAB — CANCER ANTIGEN 27.29: CA 27.29: 65 U/mL — ABNORMAL HIGH (ref 0–39)

## 2008-11-24 LAB — VITAMIN D 25 HYDROXY (VIT D DEFICIENCY, FRACTURES): Vit D, 25-Hydroxy: 20 ng/mL — ABNORMAL LOW (ref 30–89)

## 2008-11-24 LAB — LACTATE DEHYDROGENASE: LDH: 155 U/L (ref 94–250)

## 2008-11-30 ENCOUNTER — Encounter: Admission: RE | Admit: 2008-11-30 | Discharge: 2008-11-30 | Payer: Self-pay | Admitting: Oncology

## 2008-12-04 ENCOUNTER — Ambulatory Visit (HOSPITAL_BASED_OUTPATIENT_CLINIC_OR_DEPARTMENT_OTHER): Admission: RE | Admit: 2008-12-04 | Discharge: 2008-12-04 | Payer: Self-pay | Admitting: Cardiology

## 2008-12-04 LAB — CELL SEARCH FOR BREAST CANCER

## 2008-12-07 ENCOUNTER — Ambulatory Visit (HOSPITAL_COMMUNITY): Admission: RE | Admit: 2008-12-07 | Discharge: 2008-12-07 | Payer: Self-pay | Admitting: Oncology

## 2008-12-19 ENCOUNTER — Ambulatory Visit: Payer: Self-pay | Admitting: Internal Medicine

## 2009-03-29 ENCOUNTER — Ambulatory Visit: Payer: Self-pay | Admitting: Oncology

## 2009-05-19 ENCOUNTER — Ambulatory Visit: Payer: Self-pay | Admitting: Oncology

## 2009-05-21 LAB — CBC WITH DIFFERENTIAL/PLATELET
BASO%: 0.2 % (ref 0.0–2.0)
EOS%: 5.8 % (ref 0.0–7.0)
MCH: 27.2 pg (ref 25.1–34.0)
MCHC: 31.2 g/dL — ABNORMAL LOW (ref 31.5–36.0)
RBC: 3.97 10*6/uL (ref 3.70–5.45)
RDW: 15.4 % — ABNORMAL HIGH (ref 11.2–14.5)
lymph#: 1.7 10*3/uL (ref 0.9–3.3)

## 2009-05-25 LAB — CANCER ANTIGEN 27.29: CA 27.29: 52 U/mL — ABNORMAL HIGH (ref 0–39)

## 2009-05-25 LAB — COMPREHENSIVE METABOLIC PANEL
AST: 15 U/L (ref 0–37)
Albumin: 3.8 g/dL (ref 3.5–5.2)
Alkaline Phosphatase: 77 U/L (ref 39–117)
BUN: 34 mg/dL — ABNORMAL HIGH (ref 6–23)
Potassium: 4.2 mEq/L (ref 3.5–5.3)
Sodium: 137 mEq/L (ref 135–145)
Total Protein: 7.4 g/dL (ref 6.0–8.3)

## 2009-06-03 LAB — CELL SEARCH FOR BREAST CANCER

## 2009-06-22 ENCOUNTER — Ambulatory Visit: Payer: Self-pay | Admitting: Oncology

## 2009-07-09 ENCOUNTER — Encounter: Admission: RE | Admit: 2009-07-09 | Discharge: 2009-07-09 | Payer: Self-pay | Admitting: Cardiology

## 2009-10-07 ENCOUNTER — Encounter: Admission: RE | Admit: 2009-10-07 | Discharge: 2009-10-07 | Payer: Self-pay | Admitting: Cardiology

## 2009-12-20 ENCOUNTER — Ambulatory Visit: Payer: Self-pay | Admitting: Oncology

## 2009-12-30 LAB — COMPREHENSIVE METABOLIC PANEL
Alkaline Phosphatase: 84 U/L (ref 39–117)
CO2: 28 mEq/L (ref 19–32)
Creatinine, Ser: 0.91 mg/dL (ref 0.40–1.20)
Glucose, Bld: 117 mg/dL — ABNORMAL HIGH (ref 70–99)
Sodium: 138 mEq/L (ref 135–145)
Total Bilirubin: 0.7 mg/dL (ref 0.3–1.2)
Total Protein: 7.9 g/dL (ref 6.0–8.3)

## 2009-12-30 LAB — CBC WITH DIFFERENTIAL/PLATELET
Basophils Absolute: 0 10*3/uL (ref 0.0–0.1)
EOS%: 3.9 % (ref 0.0–7.0)
LYMPH%: 34.3 % (ref 14.0–49.7)
MCH: 27.3 pg (ref 25.1–34.0)
MCV: 88.7 fL (ref 79.5–101.0)
MONO%: 6.4 % (ref 0.0–14.0)
Platelets: 216 10*3/uL (ref 145–400)
RBC: 4.07 10*6/uL (ref 3.70–5.45)
RDW: 15.9 % — ABNORMAL HIGH (ref 11.2–14.5)
nRBC: 0 % (ref 0–0)

## 2009-12-30 LAB — CANCER ANTIGEN 27.29: CA 27.29: 51 U/mL — ABNORMAL HIGH (ref 0–39)

## 2009-12-30 LAB — VITAMIN D 25 HYDROXY (VIT D DEFICIENCY, FRACTURES): Vit D, 25-Hydroxy: 21 ng/mL — ABNORMAL LOW (ref 30–89)

## 2010-02-02 ENCOUNTER — Encounter: Admission: RE | Admit: 2010-02-02 | Discharge: 2010-02-02 | Payer: Self-pay | Admitting: Gastroenterology

## 2010-02-09 ENCOUNTER — Ambulatory Visit (HOSPITAL_COMMUNITY): Admission: RE | Admit: 2010-02-09 | Discharge: 2010-02-09 | Payer: Self-pay | Admitting: Cardiology

## 2010-03-28 ENCOUNTER — Inpatient Hospital Stay (HOSPITAL_COMMUNITY): Admission: EM | Admit: 2010-03-28 | Discharge: 2010-04-04 | Payer: Self-pay | Admitting: Emergency Medicine

## 2010-05-24 ENCOUNTER — Inpatient Hospital Stay (HOSPITAL_COMMUNITY): Admission: EM | Admit: 2010-05-24 | Discharge: 2010-05-30 | Payer: Self-pay | Admitting: Emergency Medicine

## 2010-06-13 IMAGING — RF DG ESOPHAGUS
10 of 11 series · 19 of 24 positions shown · non-contrast
Comparison: CT chest 06/30/2008

CLINICAL DATA: Dysphagia

ESOPHOGRAM/BARIUM SWALLOW
TECHNIQUE: Single contrast examination was performed using thin
and thick liquid barium.
Fluoroscopy time:  2.6 minutes.

[Series 1: run · 2 of 3 slices shown (1 of 10)]
[im 1/3]
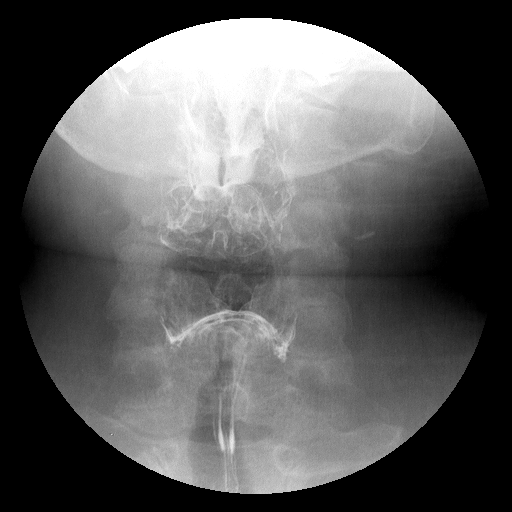
[im 2/3]
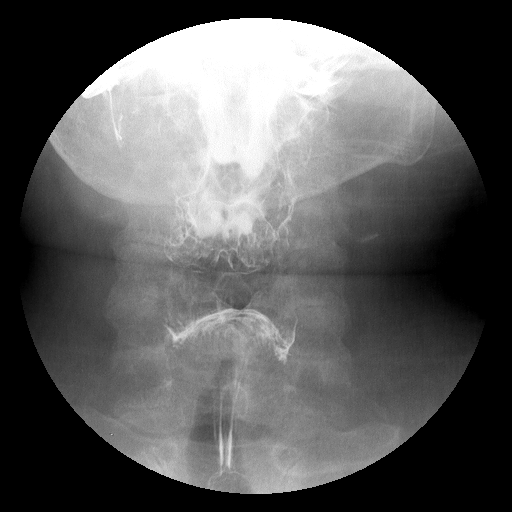

[Series 2: run · 2 of 2 slices shown (2 of 10)]
[im 1/2]
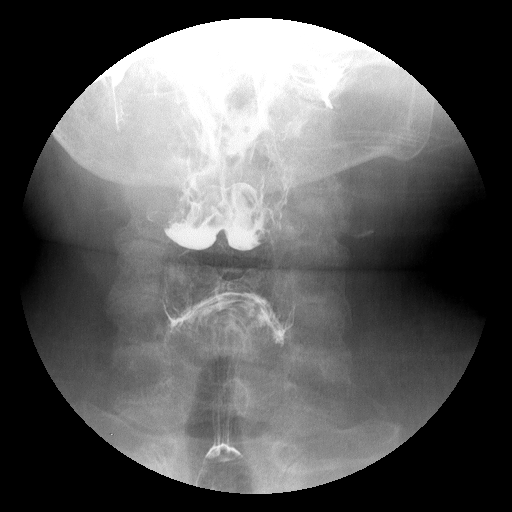
[im 2/2]
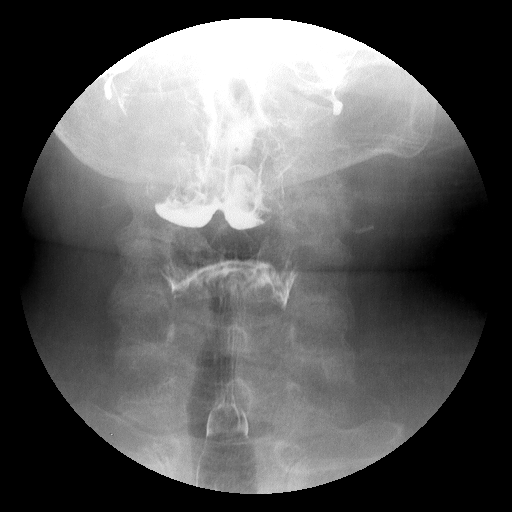

[Series 3: run · 5 of 9 slices shown (3 of 10)]
[im 1/9]
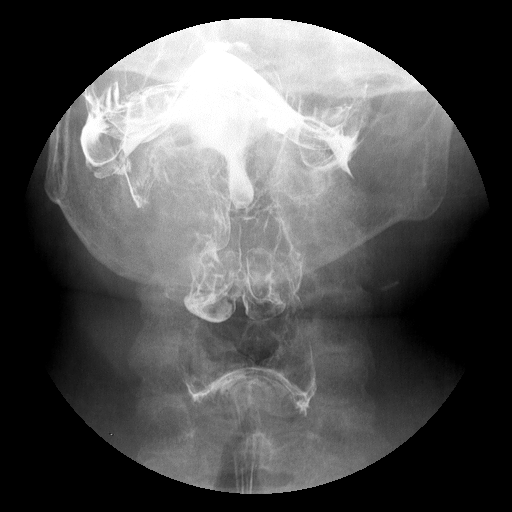
[im 2/9]
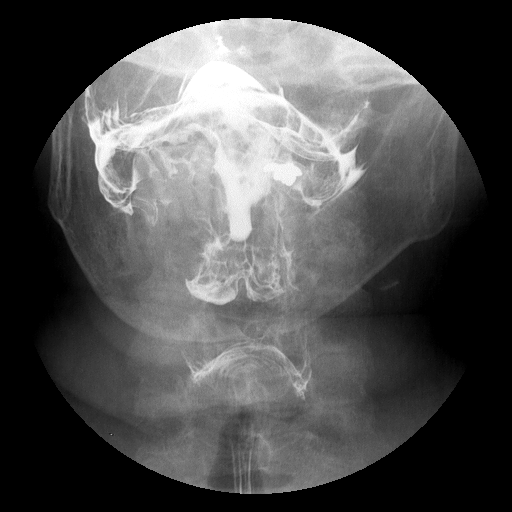
[im 5/9]
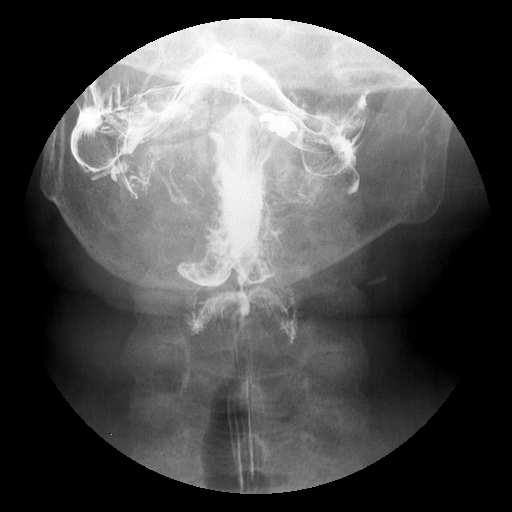
[im 7/9]
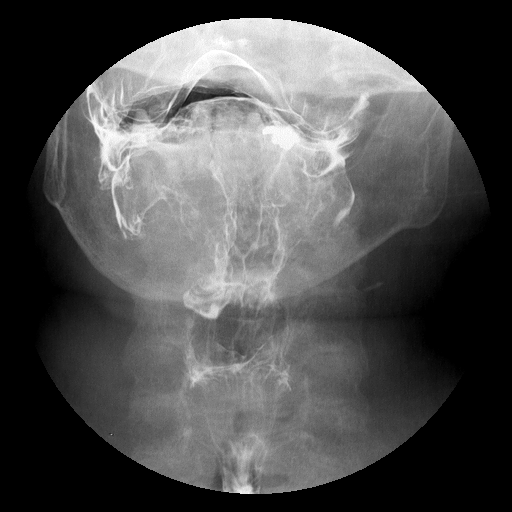
[im 9/9]
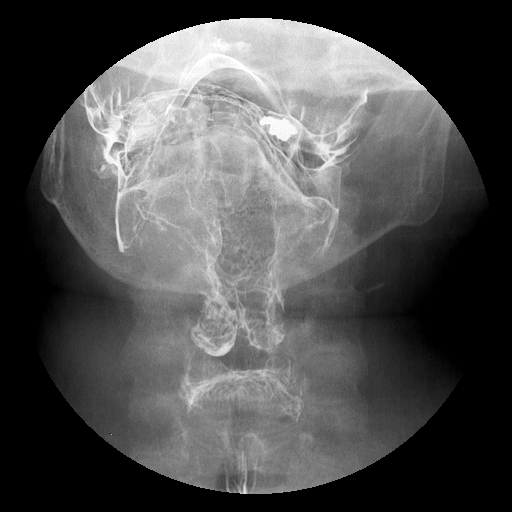

[Series 5: run · 4 of 7 slices shown (4 of 10)]
[im 1/7]
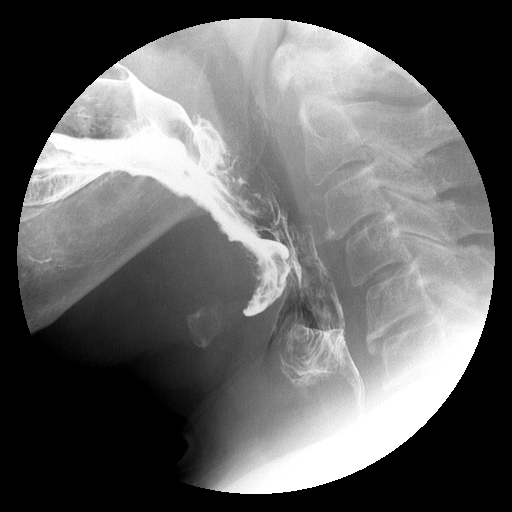
[im 2/7]
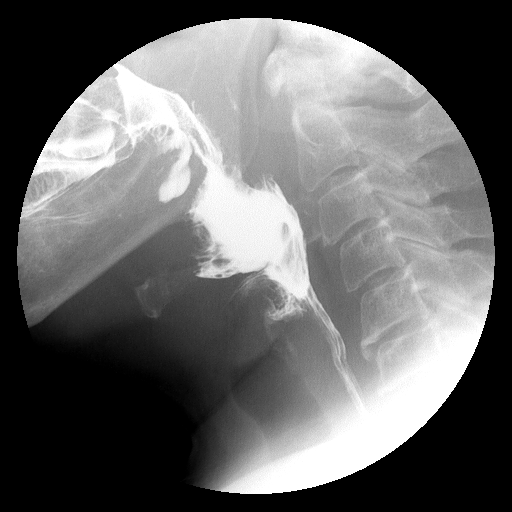
[im 4/7]
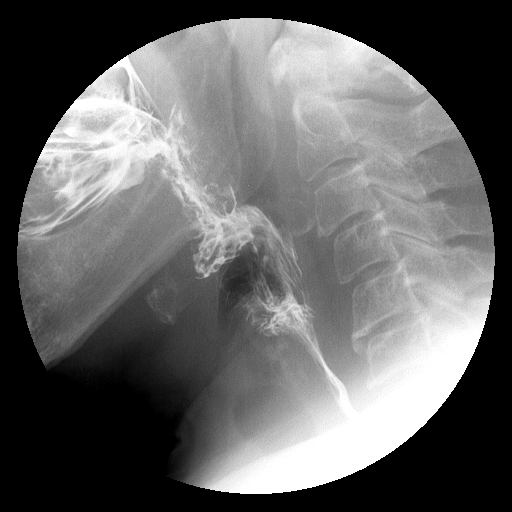
[im 5/7]
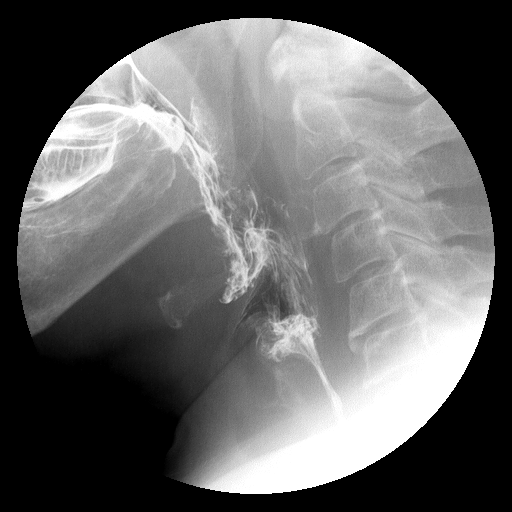

[Series 6: run · 1 of 2 slices shown (5 of 10)]
[im 1/2]
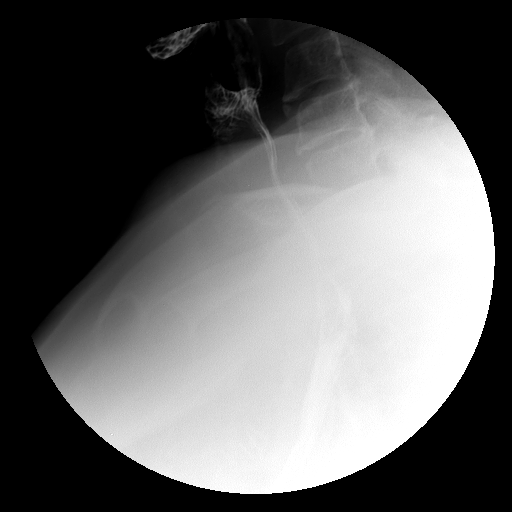

[Series 7: run · 1 of 2 slices shown (6 of 10)]
[im 1/2]
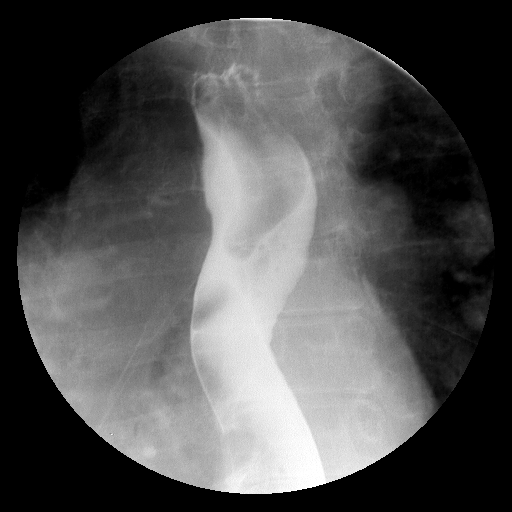

[Series 8: run · 1 of 2 slices shown (7 of 10)]
[im 1/2]
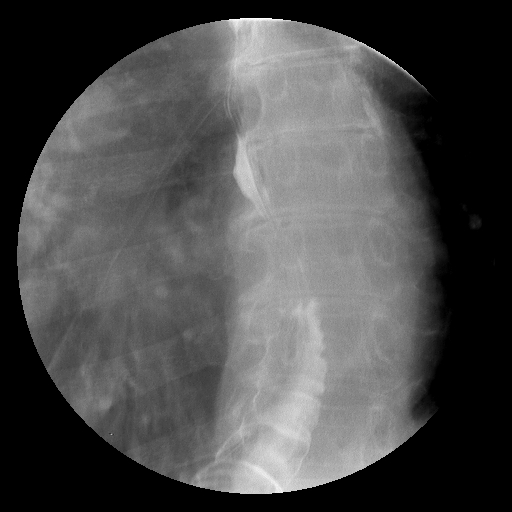

[Series 9: run · 1 of 3 slices shown (8 of 10)]
[im 1/3]
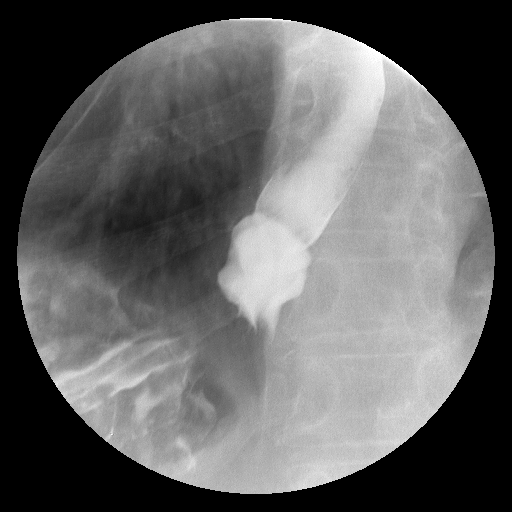

[Series 10: run · 1 of 2 slices shown (9 of 10)]
[im 1/2]
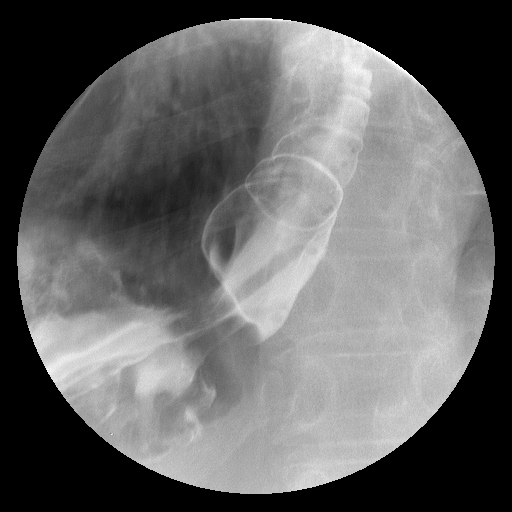

[Series 11: run · 1 of 2 slices shown (10 of 10)]
[im 1/2]
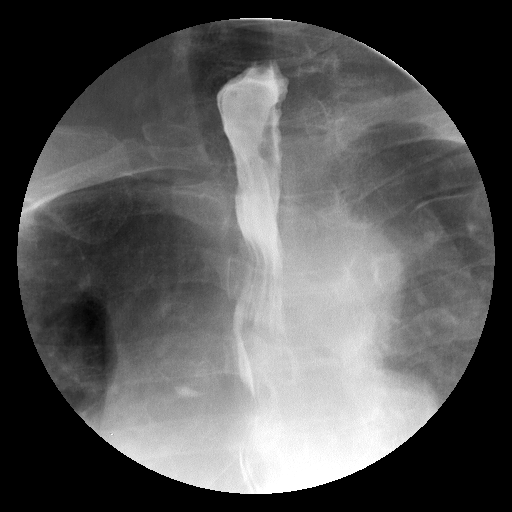

[19 of 24 positions shown; findings below may reference images not displayed]

FINDINGS: Symmetrical valleculae and piriform sinuses.  Swallowing
function appeared normal.  Primary esophageal stripping wave is
intact.  Minimal tertiary contractions.  Impression on the
esophagus due to a double aortic arch.  No significant esophageal
narrowing.

The patient swallowed a 12.5 mm barium tablet in the erect
position.  Tablet traversed the esophagus and entered the stomach
without delay.

Sliding type hiatal hernia.  Gastroesophageal reflux.
IMPRESSION: Normal esophageal motility.  Extrinsic indentations on the
esophagus due to a double aortic arch.  This was appreciated on the
06/30/2008 CT scan.  Hiatal hernia.  GE reflux.

## 2010-07-11 ENCOUNTER — Encounter: Admission: RE | Admit: 2010-07-11 | Discharge: 2010-07-11 | Payer: Self-pay | Admitting: Oncology

## 2010-07-19 ENCOUNTER — Ambulatory Visit: Payer: Self-pay | Admitting: Oncology

## 2010-07-21 LAB — CBC WITH DIFFERENTIAL/PLATELET
BASO%: 0.4 % (ref 0.0–2.0)
Eosinophils Absolute: 0.1 10*3/uL (ref 0.0–0.5)
LYMPH%: 35.6 % (ref 14.0–49.7)
MCHC: 31.6 g/dL (ref 31.5–36.0)
MCV: 84.5 fL (ref 79.5–101.0)
MONO#: 0.4 10*3/uL (ref 0.1–0.9)
MONO%: 7.4 % (ref 0.0–14.0)
NEUT#: 2.8 10*3/uL (ref 1.5–6.5)
RBC: 4.46 10*6/uL (ref 3.70–5.45)
RDW: 16.1 % — ABNORMAL HIGH (ref 11.2–14.5)
WBC: 5.1 10*3/uL (ref 3.9–10.3)
nRBC: 0 % (ref 0–0)

## 2010-07-21 LAB — COMPREHENSIVE METABOLIC PANEL
ALT: 12 U/L (ref 0–35)
AST: 20 U/L (ref 0–37)
CO2: 26 mEq/L (ref 19–32)
Creatinine, Ser: 1.25 mg/dL — ABNORMAL HIGH (ref 0.40–1.20)
Sodium: 140 mEq/L (ref 135–145)
Total Bilirubin: 0.7 mg/dL (ref 0.3–1.2)
Total Protein: 7.6 g/dL (ref 6.0–8.3)

## 2010-07-21 LAB — PROTIME-INR

## 2010-07-21 LAB — CANCER ANTIGEN 27.29: CA 27.29: 45 U/mL — ABNORMAL HIGH (ref 0–39)

## 2010-10-10 ENCOUNTER — Encounter: Admission: RE | Admit: 2010-10-10 | Discharge: 2010-10-10 | Payer: Self-pay | Admitting: Cardiology

## 2010-11-03 ENCOUNTER — Encounter: Admission: RE | Admit: 2010-11-03 | Discharge: 2010-11-03 | Payer: Self-pay | Admitting: Cardiology

## 2011-01-15 ENCOUNTER — Encounter: Payer: Self-pay | Admitting: Cardiology

## 2011-01-16 ENCOUNTER — Ambulatory Visit: Payer: Self-pay | Admitting: Oncology

## 2011-01-18 LAB — CBC WITH DIFFERENTIAL/PLATELET
Basophils Absolute: 0 10*3/uL (ref 0.0–0.1)
HCT: 38.9 % (ref 34.8–46.6)
LYMPH%: 30.2 % (ref 14.0–49.7)
MCH: 27.3 pg (ref 25.1–34.0)
MCHC: 32.5 g/dL (ref 31.5–36.0)
NEUT%: 60.1 % (ref 38.4–76.8)
Platelets: 240 10*3/uL (ref 145–400)
RDW: 15.4 % — ABNORMAL HIGH (ref 11.2–14.5)
WBC: 5.4 10*3/uL (ref 3.9–10.3)

## 2011-01-19 LAB — COMPREHENSIVE METABOLIC PANEL
ALT: 9 U/L (ref 0–35)
AST: 14 U/L (ref 0–37)
Albumin: 4.1 g/dL (ref 3.5–5.2)
Alkaline Phosphatase: 112 U/L (ref 39–117)
BUN: 19 mg/dL (ref 6–23)
Creatinine, Ser: 1.02 mg/dL (ref 0.40–1.20)
Glucose, Bld: 89 mg/dL (ref 70–99)
Potassium: 4.3 mEq/L (ref 3.5–5.3)

## 2011-01-19 LAB — VITAMIN D 25 HYDROXY (VIT D DEFICIENCY, FRACTURES): Vit D, 25-Hydroxy: 24 ng/mL — ABNORMAL LOW (ref 30–89)

## 2011-01-19 LAB — CANCER ANTIGEN 27.29: CA 27.29: 57 U/mL — ABNORMAL HIGH (ref 0–39)

## 2011-02-04 ENCOUNTER — Emergency Department (HOSPITAL_COMMUNITY)
Admission: EM | Admit: 2011-02-04 | Discharge: 2011-02-04 | Disposition: A | Discharge status: Home / Self Care | Payer: Medicare Other | Attending: Emergency Medicine | Admitting: Emergency Medicine

## 2011-02-04 ENCOUNTER — Emergency Department (HOSPITAL_COMMUNITY): Payer: Medicare Other

## 2011-02-04 DIAGNOSIS — J449 Chronic obstructive pulmonary disease, unspecified: Secondary | ICD-10-CM | POA: Insufficient documentation

## 2011-02-04 DIAGNOSIS — Z853 Personal history of malignant neoplasm of breast: Secondary | ICD-10-CM | POA: Insufficient documentation

## 2011-02-04 DIAGNOSIS — J4489 Other specified chronic obstructive pulmonary disease: Secondary | ICD-10-CM | POA: Insufficient documentation

## 2011-02-04 DIAGNOSIS — K5900 Constipation, unspecified: Secondary | ICD-10-CM | POA: Insufficient documentation

## 2011-02-04 DIAGNOSIS — E119 Type 2 diabetes mellitus without complications: Secondary | ICD-10-CM | POA: Insufficient documentation

## 2011-02-04 DIAGNOSIS — Z7901 Long term (current) use of anticoagulants: Secondary | ICD-10-CM | POA: Insufficient documentation

## 2011-02-04 DIAGNOSIS — E7800 Pure hypercholesterolemia, unspecified: Secondary | ICD-10-CM | POA: Insufficient documentation

## 2011-02-04 DIAGNOSIS — I509 Heart failure, unspecified: Secondary | ICD-10-CM | POA: Insufficient documentation

## 2011-02-04 DIAGNOSIS — R1031 Right lower quadrant pain: Secondary | ICD-10-CM | POA: Insufficient documentation

## 2011-02-04 DIAGNOSIS — I4891 Unspecified atrial fibrillation: Secondary | ICD-10-CM | POA: Insufficient documentation

## 2011-02-04 DIAGNOSIS — Z79899 Other long term (current) drug therapy: Secondary | ICD-10-CM | POA: Insufficient documentation

## 2011-02-04 LAB — COMPREHENSIVE METABOLIC PANEL
BUN: 18 mg/dL (ref 6–23)
CO2: 30 mEq/L (ref 19–32)
Calcium: 9.3 mg/dL (ref 8.4–10.5)
Creatinine, Ser: 0.84 mg/dL (ref 0.4–1.2)
GFR calc non Af Amer: >60 mL/min (ref >60)
Glucose, Bld: 129 mg/dL — ABNORMAL HIGH (ref 70–99)
Total Bilirubin: 0.3 mg/dL (ref 0.3–1.2)

## 2011-02-04 LAB — URINALYSIS, ROUTINE W REFLEX MICROSCOPIC
Nitrite: NEGATIVE
Protein, ur: NEGATIVE mg/dL
Specific Gravity, Urine: 1.021 (ref 1.005–1.030)
Urobilinogen, UA: 0.2 mg/dL (ref 0.0–1.0)

## 2011-02-04 LAB — DIFFERENTIAL
Basophils Absolute: 0 10*3/uL (ref 0.0–0.1)
Eosinophils Relative: 2 % (ref 0–5)
Lymphocytes Relative: 21 % (ref 12–46)
Lymphs Abs: 1.6 10*3/uL (ref 0.7–4.0)
Neutro Abs: 5.3 10*3/uL (ref 1.7–7.7)

## 2011-02-04 LAB — CBC
HCT: 39.2 % (ref 36.0–46.0)
Hemoglobin: 12.3 g/dL (ref 12.0–15.0)
MCV: 83.2 fL (ref 78.0–100.0)
RBC: 4.71 MIL/uL (ref 3.87–5.11)
WBC: 7.6 10*3/uL (ref 4.0–10.5)

## 2011-02-04 LAB — LIPASE, BLOOD: Lipase: 18 U/L (ref 11–59)

## 2011-02-04 MED ORDER — IOHEXOL 300 MG/ML  SOLN
120.0000 mL | Freq: Once | INTRAMUSCULAR | Status: AC | PRN
  Administered 2011-02-04: 120 mL via INTRAVENOUS

## 2011-03-13 LAB — GLUCOSE, CAPILLARY
Glucose-Capillary: 100 mg/dL — ABNORMAL HIGH (ref 70–99)
Glucose-Capillary: 104 mg/dL — ABNORMAL HIGH (ref 70–99)
Glucose-Capillary: 105 mg/dL — ABNORMAL HIGH (ref 70–99)
Glucose-Capillary: 106 mg/dL — ABNORMAL HIGH (ref 70–99)
Glucose-Capillary: 112 mg/dL — ABNORMAL HIGH (ref 70–99)
Glucose-Capillary: 113 mg/dL — ABNORMAL HIGH (ref 70–99)
Glucose-Capillary: 115 mg/dL — ABNORMAL HIGH (ref 70–99)
Glucose-Capillary: 118 mg/dL — ABNORMAL HIGH (ref 70–99)
Glucose-Capillary: 120 mg/dL — ABNORMAL HIGH (ref 70–99)
Glucose-Capillary: 123 mg/dL — ABNORMAL HIGH (ref 70–99)
Glucose-Capillary: 127 mg/dL — ABNORMAL HIGH (ref 70–99)
Glucose-Capillary: 136 mg/dL — ABNORMAL HIGH (ref 70–99)
Glucose-Capillary: 137 mg/dL — ABNORMAL HIGH (ref 70–99)
Glucose-Capillary: 142 mg/dL — ABNORMAL HIGH (ref 70–99)
Glucose-Capillary: 148 mg/dL — ABNORMAL HIGH (ref 70–99)
Glucose-Capillary: 182 mg/dL — ABNORMAL HIGH (ref 70–99)

## 2011-03-13 LAB — CBC
Hemoglobin: 10.5 g/dL — ABNORMAL LOW (ref 12.0–15.0)
Hemoglobin: 11.9 g/dL — ABNORMAL LOW (ref 12.0–15.0)
Platelets: 254 10*3/uL (ref 150–400)
RBC: 3.7 MIL/uL — ABNORMAL LOW (ref 3.87–5.11)
RBC: 4.22 MIL/uL (ref 3.87–5.11)
RDW: 15.5 % (ref 11.5–15.5)
WBC: 5.4 10*3/uL (ref 4.0–10.5)
WBC: 10.1 10*3/uL (ref 4.0–10.5)

## 2011-03-13 LAB — CULTURE, BLOOD (ROUTINE X 2)
Culture: NO GROWTH
Culture: NO GROWTH

## 2011-03-13 LAB — POCT CARDIAC MARKERS
CKMB, poc: 1.2 ng/mL (ref 1.0–8.0)
Myoglobin, poc: >500 ng/mL (ref 12–200)
Troponin i, poc: <0.05 ng/mL (ref 0.00–0.09)

## 2011-03-13 LAB — DIFFERENTIAL
Lymphocytes Relative: 12 % (ref 12–46)
Lymphs Abs: 1.2 10*3/uL (ref 0.7–4.0)
Monocytes Absolute: 0.6 10*3/uL (ref 0.1–1.0)
Monocytes Relative: 6 % (ref 3–12)
Neutro Abs: 8.2 10*3/uL — ABNORMAL HIGH (ref 1.7–7.7)

## 2011-03-13 LAB — BASIC METABOLIC PANEL
CO2: 28 mEq/L (ref 19–32)
GFR calc non Af Amer: 41 mL/min — ABNORMAL LOW (ref >60)
Glucose, Bld: 131 mg/dL — ABNORMAL HIGH (ref 70–99)
Potassium: 4.1 mEq/L (ref 3.5–5.1)
Sodium: 139 mEq/L (ref 135–145)

## 2011-03-13 LAB — PROTIME-INR
INR: 2.21 — ABNORMAL HIGH (ref 0.00–1.49)
INR: 2.44 — ABNORMAL HIGH (ref 0.00–1.49)
INR: 2.45 — ABNORMAL HIGH (ref 0.00–1.49)
INR: 7.53 (ref 0.00–1.49)
Prothrombin Time: 20.1 seconds — ABNORMAL HIGH (ref 11.6–15.2)
Prothrombin Time: 24.3 s — ABNORMAL HIGH (ref 11.6–15.2)
Prothrombin Time: 26.4 seconds — ABNORMAL HIGH (ref 11.6–15.2)

## 2011-03-13 LAB — URINALYSIS, MICROSCOPIC ONLY
Glucose, UA: NEGATIVE mg/dL
Ketones, ur: NEGATIVE mg/dL
Nitrite: NEGATIVE
pH: 5.5 (ref 5.0–8.0)

## 2011-03-13 LAB — POCT I-STAT, CHEM 8
BUN: 25 mg/dL — ABNORMAL HIGH (ref 6–23)
Calcium, Ion: 1.19 mmol/L (ref 1.12–1.32)
Chloride: 107 meq/L (ref 96–112)
Creatinine, Ser: 1.6 mg/dL — ABNORMAL HIGH (ref 0.4–1.2)
Glucose, Bld: 121 mg/dL — ABNORMAL HIGH (ref 70–99)
HCT: 39 % (ref 36.0–46.0)
Hemoglobin: 13.3 g/dL (ref 12.0–15.0)
Potassium: 4.8 meq/L (ref 3.5–5.1)
Sodium: 139 meq/L (ref 135–145)
TCO2: 28 mmol/L (ref 0–100)

## 2011-03-13 LAB — COMPREHENSIVE METABOLIC PANEL
ALT: 32 U/L (ref 0–35)
AST: 57 U/L — ABNORMAL HIGH (ref 0–37)
Albumin: 2.4 g/dL — ABNORMAL LOW (ref 3.5–5.2)
Alkaline Phosphatase: 98 U/L (ref 39–117)
BUN: 25 mg/dL — ABNORMAL HIGH (ref 6–23)
Chloride: 105 mEq/L (ref 96–112)
GFR calc Af Amer: 41 mL/min — ABNORMAL LOW (ref >60)
Potassium: 4.4 mEq/L (ref 3.5–5.1)
Sodium: 135 mEq/L (ref 135–145)
Total Bilirubin: 0.8 mg/dL (ref 0.3–1.2)
Total Protein: 7.1 g/dL (ref 6.0–8.3)

## 2011-03-13 LAB — HEMOGLOBIN A1C: Mean Plasma Glucose: 137 mg/dL — ABNORMAL HIGH (ref <117)

## 2011-03-13 LAB — APTT

## 2011-03-13 LAB — BRAIN NATRIURETIC PEPTIDE: Pro B Natriuretic peptide (BNP): 395 pg/mL — ABNORMAL HIGH (ref 0.0–100.0)

## 2011-03-15 LAB — DIFFERENTIAL
Basophils Absolute: 0 10*3/uL (ref 0.0–0.1)
Eosinophils Relative: 4 % (ref 0–5)
Lymphocytes Relative: 30 % (ref 12–46)
Lymphs Abs: 1.2 10*3/uL (ref 0.7–4.0)
Monocytes Absolute: 0.3 10*3/uL (ref 0.1–1.0)
Monocytes Relative: 8 % (ref 3–12)
Neutro Abs: 2.3 10*3/uL (ref 1.7–7.7)

## 2011-03-15 LAB — GLUCOSE, CAPILLARY
Glucose-Capillary: 100 mg/dL — ABNORMAL HIGH (ref 70–99)
Glucose-Capillary: 101 mg/dL — ABNORMAL HIGH (ref 70–99)
Glucose-Capillary: 115 mg/dL — ABNORMAL HIGH (ref 70–99)
Glucose-Capillary: 117 mg/dL — ABNORMAL HIGH (ref 70–99)
Glucose-Capillary: 134 mg/dL — ABNORMAL HIGH (ref 70–99)
Glucose-Capillary: 137 mg/dL — ABNORMAL HIGH (ref 70–99)
Glucose-Capillary: 140 mg/dL — ABNORMAL HIGH (ref 70–99)
Glucose-Capillary: 140 mg/dL — ABNORMAL HIGH (ref 70–99)
Glucose-Capillary: 150 mg/dL — ABNORMAL HIGH (ref 70–99)
Glucose-Capillary: 161 mg/dL — ABNORMAL HIGH (ref 70–99)
Glucose-Capillary: 226 mg/dL — ABNORMAL HIGH (ref 70–99)
Glucose-Capillary: 60 mg/dL — ABNORMAL LOW (ref 70–99)
Glucose-Capillary: 61 mg/dL — ABNORMAL LOW (ref 70–99)
Glucose-Capillary: 66 mg/dL — ABNORMAL LOW (ref 70–99)
Glucose-Capillary: 83 mg/dL (ref 70–99)

## 2011-03-15 LAB — LIPID PANEL
Cholesterol: 146 mg/dL (ref 0–200)
Total CHOL/HDL Ratio: 3.1 RATIO
VLDL: 8 mg/dL (ref 0–40)

## 2011-03-15 LAB — COMPREHENSIVE METABOLIC PANEL
ALT: 17 U/L (ref 0–35)
AST: 21 U/L (ref 0–37)
Alkaline Phosphatase: 81 U/L (ref 39–117)
BUN: 18 mg/dL (ref 6–23)
CO2: 28 mEq/L (ref 19–32)
Calcium: 9 mg/dL (ref 8.4–10.5)
Chloride: 102 mEq/L (ref 96–112)
Creatinine, Ser: 1.22 mg/dL — ABNORMAL HIGH (ref 0.4–1.2)
GFR calc Af Amer: 53 mL/min — ABNORMAL LOW (ref >60)
GFR calc non Af Amer: 44 mL/min — ABNORMAL LOW (ref >60)
Glucose, Bld: 132 mg/dL — ABNORMAL HIGH (ref 70–99)
Total Bilirubin: 0.8 mg/dL (ref 0.3–1.2)
Total Protein: 6.2 g/dL (ref 6.0–8.3)

## 2011-03-15 LAB — BASIC METABOLIC PANEL
BUN: 22 mg/dL (ref 6–23)
BUN: 23 mg/dL (ref 6–23)
BUN: 24 mg/dL — ABNORMAL HIGH (ref 6–23)
BUN: 24 mg/dL — ABNORMAL HIGH (ref 6–23)
CO2: 26 mEq/L (ref 19–32)
CO2: 27 mEq/L (ref 19–32)
CO2: 28 mEq/L (ref 19–32)
Calcium: 9.2 mg/dL (ref 8.4–10.5)
Chloride: 104 mEq/L (ref 96–112)
Chloride: 105 mEq/L (ref 96–112)
Chloride: 107 mEq/L (ref 96–112)
Creatinine, Ser: 1.16 mg/dL (ref 0.4–1.2)
Creatinine, Ser: 1.26 mg/dL — ABNORMAL HIGH (ref 0.4–1.2)
Creatinine, Ser: 1.47 mg/dL — ABNORMAL HIGH (ref 0.4–1.2)
GFR calc Af Amer: 51 mL/min — ABNORMAL LOW (ref >60)
GFR calc non Af Amer: 41 mL/min — ABNORMAL LOW (ref >60)
GFR calc non Af Amer: 47 mL/min — ABNORMAL LOW (ref >60)
Glucose, Bld: 106 mg/dL — ABNORMAL HIGH (ref 70–99)
Glucose, Bld: 47 mg/dL — ABNORMAL LOW (ref 70–99)
Glucose, Bld: 87 mg/dL (ref 70–99)
Potassium: 4.3 mEq/L (ref 3.5–5.1)
Potassium: 4.8 mEq/L (ref 3.5–5.1)
Potassium: 5.1 mEq/L (ref 3.5–5.1)

## 2011-03-15 LAB — PROTIME-INR
INR: 0.99 (ref 0.00–1.49)
INR: 1.08 (ref 0.00–1.49)
INR: 1.21 (ref 0.00–1.49)
INR: 2.07 — ABNORMAL HIGH (ref 0.00–1.49)
Prothrombin Time: 13 seconds (ref 11.6–15.2)
Prothrombin Time: 13.9 seconds (ref 11.6–15.2)
Prothrombin Time: 19.1 seconds — ABNORMAL HIGH (ref 11.6–15.2)
Prothrombin Time: 23.1 seconds — ABNORMAL HIGH (ref 11.6–15.2)

## 2011-03-15 LAB — APTT: aPTT: 33 seconds (ref 24–37)

## 2011-03-15 LAB — URINALYSIS, ROUTINE W REFLEX MICROSCOPIC
Bilirubin Urine: NEGATIVE
Glucose, UA: NEGATIVE mg/dL
Hgb urine dipstick: NEGATIVE
Specific Gravity, Urine: 1.011 (ref 1.005–1.030)
pH: 5.5 (ref 5.0–8.0)

## 2011-03-15 LAB — CARDIAC PANEL(CRET KIN+CKTOT+MB+TROPI)
CK, MB: 0.5 ng/mL (ref 0.3–4.0)
CK, MB: 0.5 ng/mL (ref 0.3–4.0)
Relative Index: INVALID (ref 0.0–2.5)
Relative Index: INVALID (ref 0.0–2.5)
Total CK: 50 U/L (ref 7–177)
Total CK: 52 U/L (ref 7–177)
Troponin I: <0.01 ng/mL (ref 0.00–0.06)

## 2011-03-15 LAB — CBC
HCT: 32.2 % — ABNORMAL LOW (ref 36.0–46.0)
HCT: 32.2 % — ABNORMAL LOW (ref 36.0–46.0)
HCT: 32.3 % — ABNORMAL LOW (ref 36.0–46.0)
Hemoglobin: 10.5 g/dL — ABNORMAL LOW (ref 12.0–15.0)
MCHC: 31.9 g/dL (ref 30.0–36.0)
MCV: 86.9 fL (ref 78.0–100.0)
MCV: 87 fL (ref 78.0–100.0)
Platelets: 180 10*3/uL (ref 150–400)
Platelets: 184 10*3/uL (ref 150–400)
RBC: 3.71 MIL/uL — ABNORMAL LOW (ref 3.87–5.11)
RDW: 16.3 % — ABNORMAL HIGH (ref 11.5–15.5)
RDW: 16.5 % — ABNORMAL HIGH (ref 11.5–15.5)
RDW: 16.9 % — ABNORMAL HIGH (ref 11.5–15.5)
WBC: 3.9 10*3/uL — ABNORMAL LOW (ref 4.0–10.5)
WBC: 4.1 10*3/uL (ref 4.0–10.5)

## 2011-03-15 LAB — POCT CARDIAC MARKERS: Troponin i, poc: <0.05 ng/mL (ref 0.00–0.09)

## 2011-03-15 LAB — RETICULOCYTES
RBC.: 3.82 MIL/uL — ABNORMAL LOW (ref 3.87–5.11)
Retic Count, Absolute: 38.2 10*3/uL (ref 19.0–186.0)

## 2011-03-15 LAB — HEMOGLOBIN A1C
Hgb A1c MFr Bld: 6.7 % — ABNORMAL HIGH (ref 4.6–6.1)
Mean Plasma Glucose: 146 mg/dL

## 2011-03-15 LAB — POCT I-STAT, CHEM 8
BUN: 19 mg/dL (ref 6–23)
Calcium, Ion: 1.07 mmol/L — ABNORMAL LOW (ref 1.12–1.32)
Chloride: 108 mEq/L (ref 96–112)
Creatinine, Ser: 0.9 mg/dL (ref 0.4–1.2)

## 2011-03-15 LAB — BRAIN NATRIURETIC PEPTIDE: Pro B Natriuretic peptide (BNP): 265 pg/mL — ABNORMAL HIGH (ref 0.0–100.0)

## 2011-03-15 LAB — URINE MICROSCOPIC-ADD ON

## 2011-03-15 LAB — IRON AND TIBC: Iron: 49 ug/dL (ref 42–135)

## 2011-03-15 LAB — PROTEIN, URINE, 24 HOUR
Collection Interval-UPROT: 24 hours
Urine Total Volume-UPROT: 1500 mL

## 2011-03-15 LAB — VITAMIN B12: Vitamin B-12: 540 pg/mL (ref 211–911)

## 2011-05-09 NOTE — Op Note (Signed)
Lisa Walters, Lisa Walters                ACCOUNT NO.:  [id number]   MEDICAL RECORD NO.:  [id number]          PATIENT TYPE:  AMB   LOCATION:  ENDO                         FACILITY:  [facility]   PHYSICIAN:  Anselmo Rod, M.D.  DATE OF BIRTH:  06/25/1941   DATE OF PROCEDURE:  07/08/2007  DATE OF DISCHARGE:                               OPERATIVE REPORT   PROCEDURE PERFORMED:  Screening colonoscopy   GASTROENTEROLOGIST:  Anselmo Rod, M.D.   INSTRUMENT USED:  Pentax video colonoscope.   INDICATIONS FOR PROCEDURE:  A 70 year old [place] American female with a  personal history of breast cancer diagnosed and treated last year,  undergoing screening colonoscopy to rule out colonic polyps, masses,  etc. The patient has a history of occasional abdominal discomfort.   PREPROCEDURE PREPARATION:  Informed consent was procured from the  patient.  The patient fasted for 8 hours prior to the procedure and  prepped with a bottle of magnesium citrate and a gallon of Nulytely the  night prior to the procedure.  Risks and benefits of the procedure  including a 10% miss rate of cancer and polyp were discussed with the  patient as well.   PREPROCEDURE PHYSICAL:  The patient had stable vital signs. Neck supple.  Chest clear to auscultation. S1 and S2 regular. Abdomen soft with normal  bowel sounds.   DESCRIPTION OF PROCEDURE:  The patient was placed in the left lateral  decubitus position and sedated with 80 mcg of Fentanyl and 8 mg of  Versed given intravenously in slow incremental doses.  Once the patient  was adequately sedated and maintained on low-flow oxygen and continuous  cardiac monitoring the Pentax video colonoscope was advanced from the  rectum to the cecum.  There was some residual stool in the colon.  Multiple washes were done.  The appendiceal orifice and ileocecal valve  were clearly visualized and photographed.  A few early scattered  diverticula were noted.  Retroflexion in rectum  revealed small internal  hemorrhoids.  No masses or polyps were identified.  The patient  tolerated the procedure well without complications.   IMPRESSION:  1. Essentially normal colonoscopy of the cecum. No masses or polyps      seen.  2. A few early diverticula noted.  3. Small internal hemorrhoids seen on retroflexion.   RECOMMENDATIONS.:  1. Continue high fiber diet with liberal fluid intake.  2. Repeat colonoscopy in the next 5 years.  If the patient has any      abnormal symptoms in the interim she is to      contact the office immediately for further recommendations.  3. Outpatient follow-up as need arises in the future.  Brochures on      diverticulosis and a high-fiber diet have been given to the patient      for further education.      Anselmo Rod, M.D.  Electronically Signed     JNM/MEDQ  D:  07/08/2007  T:  07/09/2007  Job:  [id number]   cc:   Osvaldo Shipper. Spruill, M.D.  Fax: [phone]   Pierce Crane,  M.D.  Fax: [phone]   Leonie Man, M.D.  [address]  [address]  [place]  [place] [address]

## 2011-05-09 NOTE — Procedures (Signed)
Lisa Walters, Lisa Walters                ACCOUNT NO.:  [id number]   MEDICAL RECORD NO.:  [id number]          PATIENT TYPE:  OUT   LOCATION:  SLEEP CENTER                 FACILITY:  [facility]   PHYSICIAN:  Clinton D. Maple Hudson, MD, FCCP, FACPDATE OF BIRTH:  10[birth year]   DATE OF STUDY:  12/04/2008                            NOCTURNAL POLYSOMNOGRAM   REFERRING PHYSICIAN:  Osvaldo Shipper. Spruill, M.D.   INDICATION FOR STUDY:  Hypersomnia with sleep apnea.   EPWORTH SLEEPINESS SCORE:  Epworth sleepiness score 12/24.  BMI 32.6.  Weight 190 pounds.  Height 64 inches.  Neck 14.5 inches.   MEDICATIONS:  Home medications are charted and reviewed.   SLEEP ARCHITECTURE:  Total sleep time 355 minutes with sleep efficiency  86.9%.  Stage I was 2.5%.  Stage II 86.8%.  Stage III absent.  REM 10.7%  of total sleep time.  Sleep latency 6.5 minutes.  REM latency 150  minutes.  Awake after sleep onset 40.5 minutes.  Arousal index 10.1.  Combivent was taken at bedtime.   RESPIRATORY DATA:  Apnea-hypopnea index (AHI) 11.3 per hour.  A total of  67 events was scored including 1 obstructive apnea and 66 hypopneas.  Events were nonpositional.  REM AHI 52.1.  There were insufficient early  events to permit CPAP titration by split protocol on the study night and  insufficient early sustained sleep.   OXYGEN DATA:  Mild-to-moderate snoring with oxygen desaturation to a  nadir of 78%.  Mean oxygen saturation through the study was 92.3% on  room air.  A total of 18.2 minutes was recorded with oxygen saturation  less than 88%.  The patient has a history of home oxygen use for sleep,  but the technician reported that she did not desaturate enough by the  lab protocol to initiate oxygen therapy during the study.   CARDIAC DATA:  Normal sinus rhythm.   MOVEMENT-PARASOMNIA:  Periodic limb movement with arousal.  A total of  16 limb jerks was recorded of which 11 were associated with arousable or  awakening for periodic limb  movement with arousable index of 1.9 per  hour.   IMPRESSIONS-RECOMMENDATIONS:  1. Mild obstructive sleep apnea/hypopnea syndrome, apnea-hypopnea      index 11.3 per hour with nonpositional events.  Mild-to-moderate      snoring and oxygen desaturation to a nadir of 78%.  2. She had insufficient early sleep and events to permit CPAP      titration by split protocol on the study night.  Scores in this      range can be appropriately addressed with CPAP.  Consider return      for CPAP titration or evaluate for alternative management as      appropriate.  3. The patient uses home oxygen for sleep.  She desaturated here,      recording 18.2 minutes with room air saturation less than 88%.  The      technician did not feel that the patient met protocol for      initiation of oxygen therapy during the study, but does appear to      meet standard criteria for oxygen therapy during  routine sleep at      home.  Consider repeating a home oxygen and treat while the patient      is wearing therapeutic CPAP at home to determine if whether she      would benefit from addition of oxygen for sleep at a range around 2      L per minute.      Clinton D. Maple Hudson, MD, [facility], FACP  Diplomate, Biomedical engineer of Sleep Medicine  Electronically Signed     CDY/MEDQ  D:  12/12/2008 11:32:26  T:  12/13/2008 03:30:44  Job:  [id number]

## 2011-05-12 NOTE — Discharge Summary (Signed)
NAMETASHAI, Lisa Walters                ACCOUNT NO.:  [id number]   MEDICAL RECORD NO.:  [id number]          PATIENT TYPE:  INP   LOCATION:  5036                         FACILITY:  [facility]   PHYSICIAN:  Lubertha Basque. Dalldorf, M.D.DATE OF BIRTH:  [birth year]   DATE OF ADMISSION:  06/13/2005  DATE OF DISCHARGE:  06/16/2005                                 DISCHARGE SUMMARY   ADMISSION DIAGNOSES:  1.  Loose tibial component, status post left knee replacement.  2.  History of hypertension.  3.  History of diabetes mellitus.  4.  History of asthma.  5.  History of esophageal reflux disease.   DISCHARGE DIAGNOSES:  1.  Loose tibial component, status post left knee replacement.  2.  History of hypertension.  3.  History of diabetes mellitus.  4.  History of asthma.  5.  History of esophageal reflux disease.   OPERATIONS/PROCEDURES/TREATMENT:  Revision left total knee replacement,  tibial component.   HISTORY OF PRESENT ILLNESS:  This is a 69 year old black female patient well  known to our practice, who has been having knee pain for a number of months  in the proximal tibial area of her knee replacement. We have done a bone  scan, which has shown that this tibial component is loose and discussed  treatment options with her, being knee revision of the tibial component. She  would like to proceed with this, as she is having pain now with every step  and some night time pain and some swelling.   LABORATORY DATA:  Chest, no active lung disease.   White blood cell count 7.0. Red blood cells 2.81. Hemoglobin on last testing  was 7.5. She started at 11.1. Hematocrit 23.3. and platelets of 208,000. Pro-  time 18.8 and INR of 1.6. Sodium 135, potassium 3.6, chloride 98, glucose  166, BUN 8, creatinine 0.9, calcium at 8.5. She is noted to be O positive,  antibody screen negative. Cultures taken at the time of surgery from her  knee are normal, now growth. No organisms seen.   HOSPITAL COURSE:  She was  admitted from the operating room and was on her  home medicines, which are Coreg 6.25 q. a.m., Cozaar 50 q. a.m., Lipitor 20  q. a.m., Xanax 0.25 t.i.d., Avandia 4 mg t.i.d., Lasix 20 t.i.d., Nexium 20  q. a.m., potassium chloride 20 q. a.m., Humibid DM 60 b.i.d., and Zelnorm 6  mg b.i.d. She was on an IV of D5 lactated ringers at 100 cc an hour, Keflex  or Ancef 1 gram IV q. 8 x4 doses. Regular diet, advance as tolerated.  Coumadin protocol. Lovenox protocol per pharmacy. Percocet by mouth. Ice  packs to her knee. Incentive spirometry q. 1 hour. Knee high TEDs both legs  and a knee immobilizer when she was up walking. CPM machine 8 hours a day,  advance daily by 10 degrees. Followup laboratory studies as mentioned above  and she can be weight bearing as tolerated. Her first day postoperative, she  was afebrile. Vital signs were stable. Dressing was dry. Good neurovascular  status to her toes. Her calf  was soft. Abdomen was soft and regular rate and  rhythm. Her CPM was continued. Physical therapy was ordered and  rehabilitation consult was also ordered. Foley to be discontinued the second  day postoperative. The second day postoperative, she was comfortable on her  PCA. She had been out of bed. She is eating p.o. food, voiding well.  Hematocrit was 25.7. INR 1.5 and potassium of 3.6. We discontinued her PCA  on that day and changed her dressing. Her wound was dry and there was no  sign of infection or irritation. She continued to do well and it was thought  that she could be discharged home and she was discharged to the  rehabilitation unit.   CONDITION ON DISCHARGE:  Improved.   FOLLOW UP:  Her staples or stitches will come out at the 2 mark from  surgery.   DISCHARGE MEDICATIONS:  She will remain on her own medicines, which were:  1.  Coreg 6.25 1 a day.  2.  Cozaar 50 1 a day.  3.  Lipitor 20 1 day.  4.  Xanax 0.25 3 a day.  5.  Avandia 4 mg t.i.d.  6.  Lasix 20 mg t.i.d.  7.   Nexium 20 mg q. a.m.  8.  Potassium chloride 20 mg once a day.  9.  Humibid AM 600 mg b.i.d.  10. Zelnorm 6 mg b.i.d.  11. Percocet for pain 1 or 2 q. 4 to 6 p.r.n. pain.  12. Continue on Lovenox/Coumadin protocol for deep vein thrombosis      prophylaxis.  13. She is also on Alupent inhaler and AeroBid inhalers.   SPECIAL INSTRUCTIONS:  Ice to her knee. Weight bearing as tolerated with  physical therapy.   DIET:  Diabetic diet. Advance as tolerated.      Lisa Walters, P.A.      [name], M.D.  Electronically Signed    MC/MEDQ  D:  08/17/2005  T:  08/18/2005  Job:  [id number]

## 2011-05-19 ENCOUNTER — Other Ambulatory Visit (HOSPITAL_COMMUNITY): Payer: Self-pay | Admitting: Cardiology

## 2011-06-16 ENCOUNTER — Other Ambulatory Visit (HOSPITAL_COMMUNITY): Payer: Medicare Other

## 2011-06-21 ENCOUNTER — Other Ambulatory Visit: Payer: Self-pay | Admitting: Oncology

## 2011-06-21 DIAGNOSIS — Z853 Personal history of malignant neoplasm of breast: Secondary | ICD-10-CM

## 2011-06-21 DIAGNOSIS — Z9889 Other specified postprocedural states: Secondary | ICD-10-CM

## 2011-06-23 ENCOUNTER — Other Ambulatory Visit: Payer: Self-pay | Admitting: Oncology

## 2011-06-23 DIAGNOSIS — Z9889 Other specified postprocedural states: Secondary | ICD-10-CM

## 2011-06-23 DIAGNOSIS — Z853 Personal history of malignant neoplasm of breast: Secondary | ICD-10-CM

## 2011-07-19 ENCOUNTER — Other Ambulatory Visit (HOSPITAL_COMMUNITY): Payer: Medicare Other

## 2011-07-20 ENCOUNTER — Other Ambulatory Visit: Payer: Medicare Other

## 2011-07-25 ENCOUNTER — Ambulatory Visit
Admission: RE | Admit: 2011-07-25 | Discharge: 2011-07-25 | Disposition: A | Discharge status: Home / Self Care | Payer: Medicare Other | Source: Ambulatory Visit | Attending: Oncology | Admitting: Oncology

## 2011-07-25 DIAGNOSIS — Z853 Personal history of malignant neoplasm of breast: Secondary | ICD-10-CM

## 2011-07-25 DIAGNOSIS — Z9889 Other specified postprocedural states: Secondary | ICD-10-CM

## 2011-07-25 MED ORDER — GADOBENATE DIMEGLUMINE 529 MG/ML IV SOLN
20.0000 mL | Freq: Once | INTRAVENOUS | Status: AC | PRN
  Administered 2011-07-25: 20 mL via INTRAVENOUS

## 2011-07-26 ENCOUNTER — Encounter (HOSPITAL_COMMUNITY)
Admission: RE | Admit: 2011-07-26 | Discharge: 2011-07-26 | Disposition: A | Discharge status: Home / Self Care | Payer: Medicare Other | Source: Ambulatory Visit | Attending: Cardiology | Admitting: Cardiology

## 2011-07-26 ENCOUNTER — Ambulatory Visit (HOSPITAL_COMMUNITY)
Admission: RE | Admit: 2011-07-26 | Discharge: 2011-07-26 | Disposition: A | Discharge status: Home / Self Care | Payer: Medicare Other | Source: Ambulatory Visit | Attending: Cardiology | Admitting: Cardiology

## 2011-07-26 DIAGNOSIS — R079 Chest pain, unspecified: Secondary | ICD-10-CM | POA: Insufficient documentation

## 2011-07-26 DIAGNOSIS — I499 Cardiac arrhythmia, unspecified: Secondary | ICD-10-CM | POA: Insufficient documentation

## 2011-07-26 DIAGNOSIS — R9439 Abnormal result of other cardiovascular function study: Secondary | ICD-10-CM | POA: Insufficient documentation

## 2011-07-26 MED ORDER — TECHNETIUM TC 99M TETROFOSMIN IV KIT
10.0000 | PACK | Freq: Once | INTRAVENOUS | Status: AC | PRN
  Administered 2011-07-26: 10 via INTRAVENOUS

## 2011-07-26 MED ORDER — TECHNETIUM TC 99M TETROFOSMIN IV KIT
30.0000 | PACK | Freq: Once | INTRAVENOUS | Status: AC | PRN
  Administered 2011-07-26: 30 via INTRAVENOUS

## 2011-08-22 ENCOUNTER — Inpatient Hospital Stay (INDEPENDENT_AMBULATORY_CARE_PROVIDER_SITE_OTHER)
Admission: RE | Admit: 2011-08-22 | Discharge: 2011-08-22 | Disposition: A | Discharge status: Home / Self Care | Payer: Medicare Other | Source: Ambulatory Visit | Attending: Emergency Medicine | Admitting: Emergency Medicine

## 2011-08-22 ENCOUNTER — Ambulatory Visit (INDEPENDENT_AMBULATORY_CARE_PROVIDER_SITE_OTHER): Payer: Medicare Other

## 2011-08-22 DIAGNOSIS — M542 Cervicalgia: Secondary | ICD-10-CM

## 2011-08-22 DIAGNOSIS — M62838 Other muscle spasm: Secondary | ICD-10-CM

## 2011-08-22 LAB — POCT I-STAT, CHEM 8
BUN: 20 mg/dL (ref 6–23)
Calcium, Ion: 1.13 mmol/L (ref 1.12–1.32)
Chloride: 106 mEq/L (ref 96–112)
Creatinine, Ser: 1.1 mg/dL (ref 0.50–1.10)
Sodium: 138 mEq/L (ref 135–145)
TCO2: 25 mmol/L (ref 0–100)

## 2011-08-22 LAB — PROTIME-INR: INR: 1.41 (ref 0.00–1.49)

## 2012-01-15 ENCOUNTER — Other Ambulatory Visit: Payer: Self-pay | Admitting: Cardiovascular Disease

## 2012-01-15 ENCOUNTER — Ambulatory Visit
Admission: RE | Admit: 2012-01-15 | Discharge: 2012-01-15 | Disposition: A | Discharge status: Home / Self Care | Payer: Medicare Other | Source: Ambulatory Visit | Attending: Cardiovascular Disease | Admitting: Cardiovascular Disease

## 2012-01-15 DIAGNOSIS — R05 Cough: Secondary | ICD-10-CM

## 2012-05-27 ENCOUNTER — Ambulatory Visit (INDEPENDENT_AMBULATORY_CARE_PROVIDER_SITE_OTHER): Payer: Medicare Other | Admitting: Obstetrics and Gynecology

## 2012-05-27 ENCOUNTER — Encounter: Payer: Self-pay | Admitting: Obstetrics and Gynecology

## 2012-05-27 VITALS — BP 120/70 | Temp 98.0°F | Ht 62.0 in | Wt 217.0 lb

## 2012-05-27 DIAGNOSIS — Z719 Counseling, unspecified: Secondary | ICD-10-CM

## 2012-05-27 DIAGNOSIS — M8580 Other specified disorders of bone density and structure, unspecified site: Secondary | ICD-10-CM | POA: Insufficient documentation

## 2012-05-27 DIAGNOSIS — C50919 Malignant neoplasm of unspecified site of unspecified female breast: Secondary | ICD-10-CM | POA: Insufficient documentation

## 2012-05-27 DIAGNOSIS — R350 Frequency of micturition: Secondary | ICD-10-CM

## 2012-05-27 DIAGNOSIS — E559 Vitamin D deficiency, unspecified: Secondary | ICD-10-CM | POA: Insufficient documentation

## 2012-05-27 DIAGNOSIS — N390 Urinary tract infection, site not specified: Secondary | ICD-10-CM

## 2012-05-27 LAB — POCT URINALYSIS DIPSTICK: Spec Grav, UA: 1.015

## 2012-05-27 MED ORDER — NITROFURANTOIN MONOHYD MACRO 100 MG PO CAPS: 100.0000 mg | ORAL_CAPSULE | Freq: Two times a day (BID) | ORAL | Status: AC

## 2012-05-27 NOTE — Progress Notes (Signed)
Vag. Discharge:no Odor:no Fever:no Irreg.Periods:no Dyspareunia:no Dysuria:yes Frequency:yes Urgency:yes Hematuria:no Kidney stones:no Constipation:yes Diarrhea:no Rectal Bleeding: no Vomiting:no Nausea:yes Pregnant:no Fibroids:no Endometriosis:no Hx of Ovarian Cyst:yes Hx IUD:no Hx STD-PID:no Appendectomy:no Gall Bladder Dz:no

## 2012-05-27 NOTE — Progress Notes (Signed)
71 YO complaining of urinary frequency and urgency x 2 months. Admits to some nausea, a little lower back pain, and pelvic aching.  Denies fever, vomiting, diarrhea or hematuria.  Admits to holding urine on occasion and will drink about 3 bottles of water daily.   O:Back: no CVA tenderness    Abdomen: soft, tenderness without guarding in both lower    quadrants, no rebound or organomegaly         Pelvic:  EGBUS-wnl, vagina-atrophic, vagina-atrophic,          cervix-no lesions, uterus/adnexae-no masses, exam limited by habitus.  U/A  1+ leuk,  pH  5.0   SG 1.050  A: UTI  P: Macrobid 100 mg bid x 7 days pc no refills     RTO if symptoms persist   Kyeisha Janowicz, PA-C

## 2012-05-28 LAB — URINE CULTURE
Colony Count: NO GROWTH
Organism ID, Bacteria: NO GROWTH

## 2012-07-08 ENCOUNTER — Ambulatory Visit
Admission: RE | Admit: 2012-07-08 | Discharge: 2012-07-08 | Disposition: A | Discharge status: Home / Self Care | Payer: Medicare Other | Source: Ambulatory Visit | Attending: Cardiovascular Disease | Admitting: Cardiovascular Disease

## 2012-07-08 ENCOUNTER — Other Ambulatory Visit: Payer: Self-pay | Admitting: Cardiovascular Disease

## 2012-07-08 DIAGNOSIS — R0602 Shortness of breath: Secondary | ICD-10-CM

## 2012-07-25 HISTORY — PX: CARDIAC CATHETERIZATION: SHX172

## 2012-08-01 ENCOUNTER — Encounter (HOSPITAL_COMMUNITY): Payer: Self-pay | Admitting: General Practice

## 2012-08-01 ENCOUNTER — Inpatient Hospital Stay (HOSPITAL_COMMUNITY)
Admission: AD | Admit: 2012-08-01 | Discharge: 2012-08-03 | DRG: 287 | Disposition: A | Discharge status: Home / Self Care | Payer: Medicare Other | Source: Ambulatory Visit | Attending: Cardiovascular Disease | Admitting: Cardiovascular Disease

## 2012-08-01 DIAGNOSIS — E785 Hyperlipidemia, unspecified: Secondary | ICD-10-CM | POA: Diagnosis present

## 2012-08-01 DIAGNOSIS — Z96659 Presence of unspecified artificial knee joint: Secondary | ICD-10-CM

## 2012-08-01 DIAGNOSIS — Z6841 Body Mass Index (BMI) 40.0 and over, adult: Secondary | ICD-10-CM

## 2012-08-01 DIAGNOSIS — E119 Type 2 diabetes mellitus without complications: Secondary | ICD-10-CM | POA: Diagnosis present

## 2012-08-01 DIAGNOSIS — M949 Disorder of cartilage, unspecified: Secondary | ICD-10-CM | POA: Diagnosis present

## 2012-08-01 DIAGNOSIS — I10 Essential (primary) hypertension: Secondary | ICD-10-CM | POA: Diagnosis present

## 2012-08-01 DIAGNOSIS — I2510 Atherosclerotic heart disease of native coronary artery without angina pectoris: Secondary | ICD-10-CM | POA: Diagnosis present

## 2012-08-01 DIAGNOSIS — M899 Disorder of bone, unspecified: Secondary | ICD-10-CM | POA: Diagnosis present

## 2012-08-01 DIAGNOSIS — E669 Obesity, unspecified: Secondary | ICD-10-CM | POA: Diagnosis present

## 2012-08-01 DIAGNOSIS — I422 Other hypertrophic cardiomyopathy: Principal | ICD-10-CM | POA: Diagnosis present

## 2012-08-01 DIAGNOSIS — I4891 Unspecified atrial fibrillation: Secondary | ICD-10-CM | POA: Diagnosis present

## 2012-08-01 DIAGNOSIS — Z853 Personal history of malignant neoplasm of breast: Secondary | ICD-10-CM

## 2012-08-01 DIAGNOSIS — Z7901 Long term (current) use of anticoagulants: Secondary | ICD-10-CM

## 2012-08-01 HISTORY — DX: Unspecified osteoarthritis, unspecified site: M19.90

## 2012-08-01 HISTORY — DX: Heart failure, unspecified: I50.9

## 2012-08-01 HISTORY — DX: Pneumonia, unspecified organism: J18.9

## 2012-08-01 LAB — CARDIAC PANEL(CRET KIN+CKTOT+MB+TROPI)
CK, MB: 1.3 ng/mL (ref 0.3–4.0)
Total CK: 61 U/L (ref 7–177)

## 2012-08-01 LAB — GLUCOSE, CAPILLARY: Glucose-Capillary: 59 mg/dL — ABNORMAL LOW (ref 70–99)

## 2012-08-01 LAB — CBC WITH DIFFERENTIAL/PLATELET
Basophils Absolute: 0 10*3/uL (ref 0.0–0.1)
Eosinophils Absolute: 0.2 10*3/uL (ref 0.0–0.7)
Eosinophils Relative: 3 % (ref 0–5)
MCH: 26.8 pg (ref 26.0–34.0)
MCV: 84.3 fL (ref 78.0–100.0)
Monocytes Absolute: 0.4 10*3/uL (ref 0.1–1.0)
Platelets: 260 10*3/uL (ref 150–400)
RDW: 15.3 % (ref 11.5–15.5)

## 2012-08-01 LAB — COMPREHENSIVE METABOLIC PANEL
AST: 23 U/L (ref 0–37)
Albumin: 3.7 g/dL (ref 3.5–5.2)
BUN: 15 mg/dL (ref 6–23)
CO2: 21 mEq/L (ref 19–32)
Calcium: 9.7 mg/dL (ref 8.4–10.5)
Creatinine, Ser: 0.86 mg/dL (ref 0.50–1.10)
GFR calc non Af Amer: 68 mL/min — ABNORMAL LOW (ref >90)

## 2012-08-01 LAB — PROTIME-INR: Prothrombin Time: 27.9 seconds — ABNORMAL HIGH (ref 11.6–15.2)

## 2012-08-01 LAB — TSH: TSH: 1.575 u[IU]/mL (ref 0.350–4.500)

## 2012-08-01 MED ORDER — ACETAMINOPHEN 325 MG PO TABS: 650.0000 mg | ORAL_TABLET | ORAL | Status: DC | PRN

## 2012-08-01 MED ORDER — ALPRAZOLAM 0.25 MG PO TABS: 0.2500 mg | ORAL_TABLET | Freq: Two times a day (BID) | ORAL | Status: DC | PRN

## 2012-08-01 MED ORDER — PHYTONADIONE 5 MG PO TABS: 10.0000 mg | ORAL_TABLET | Freq: Once | ORAL | Status: DC

## 2012-08-01 MED ORDER — ASPIRIN 300 MG RE SUPP
300.0000 mg | RECTAL | Status: AC
  Filled 2012-08-01: qty 1

## 2012-08-01 MED ORDER — ASPIRIN EC 81 MG PO TBEC
81.0000 mg | DELAYED_RELEASE_TABLET | Freq: Every day | ORAL | Status: DC
  Administered 2012-08-02 – 2012-08-03 (×2): 81 mg via ORAL
  Filled 2012-08-01 (×2): qty 1

## 2012-08-01 MED ORDER — CARVEDILOL 3.125 MG PO TABS
3.1250 mg | ORAL_TABLET | Freq: Two times a day (BID) | ORAL | Status: DC
  Administered 2012-08-01 – 2012-08-03 (×3): 3.125 mg via ORAL
  Filled 2012-08-01 (×6): qty 1

## 2012-08-01 MED ORDER — FUROSEMIDE 20 MG PO TABS
20.0000 mg | ORAL_TABLET | Freq: Every day | ORAL | Status: DC
  Filled 2012-08-01: qty 1

## 2012-08-01 MED ORDER — DILTIAZEM HCL ER BEADS 240 MG PO CP24
240.0000 mg | ORAL_CAPSULE | Freq: Every day | ORAL | Status: DC
  Administered 2012-08-02 – 2012-08-03 (×2): 240 mg via ORAL
  Filled 2012-08-01 (×2): qty 1

## 2012-08-01 MED ORDER — INSULIN ASPART 100 UNIT/ML ~~LOC~~ SOLN: 0.0000 [IU] | Freq: Three times a day (TID) | SUBCUTANEOUS | Status: DC

## 2012-08-01 MED ORDER — NITROGLYCERIN 0.4 MG SL SUBL: 0.4000 mg | SUBLINGUAL_TABLET | SUBLINGUAL | Status: DC | PRN

## 2012-08-01 MED ORDER — ONDANSETRON HCL 4 MG/2ML IJ SOLN: 4.0000 mg | Freq: Four times a day (QID) | INTRAMUSCULAR | Status: DC | PRN

## 2012-08-01 MED ORDER — SODIUM CHLORIDE 0.9 % IJ SOLN
3.0000 mL | Freq: Two times a day (BID) | INTRAMUSCULAR | Status: DC
  Administered 2012-08-01 – 2012-08-02 (×4): 3 mL via INTRAVENOUS

## 2012-08-01 MED ORDER — VITAMIN D3 25 MCG (1000 UNIT) PO TABS
1000.0000 [IU] | ORAL_TABLET | Freq: Every day | ORAL | Status: DC
  Administered 2012-08-02 – 2012-08-03 (×2): 1000 [IU] via ORAL
  Filled 2012-08-01 (×2): qty 1

## 2012-08-01 MED ORDER — ATORVASTATIN CALCIUM 10 MG PO TABS
10.0000 mg | ORAL_TABLET | Freq: Every day | ORAL | Status: DC
  Administered 2012-08-01 – 2012-08-02 (×2): 10 mg via ORAL
  Filled 2012-08-01 (×4): qty 1

## 2012-08-01 MED ORDER — SODIUM CHLORIDE 0.9 % IJ SOLN: 3.0000 mL | INTRAMUSCULAR | Status: DC | PRN

## 2012-08-01 MED ORDER — POTASSIUM CHLORIDE CRYS ER 20 MEQ PO TBCR
20.0000 meq | EXTENDED_RELEASE_TABLET | Freq: Every day | ORAL | Status: DC
  Administered 2012-08-02 – 2012-08-03 (×2): 20 meq via ORAL
  Filled 2012-08-01 (×2): qty 1

## 2012-08-01 MED ORDER — OFF THE BEAT BOOK
Freq: Once | Status: AC
  Administered 2012-08-01: 16:00:00
  Filled 2012-08-01 (×2): qty 1

## 2012-08-01 MED ORDER — SODIUM CHLORIDE 0.9 % IV SOLN: 250.0000 mL | INTRAVENOUS | Status: DC | PRN

## 2012-08-01 MED ORDER — VITAMIN K1 10 MG/ML IJ SOLN
5.0000 mg | Freq: Once | INTRAVENOUS | Status: AC
  Administered 2012-08-01: 5 mg via INTRAVENOUS
  Filled 2012-08-01: qty 0.5

## 2012-08-01 MED ORDER — BUDESONIDE 0.5 MG/2ML IN SUSP
0.5000 mg | Freq: Two times a day (BID) | RESPIRATORY_TRACT | Status: DC
  Administered 2012-08-01 – 2012-08-03 (×4): 0.5 mg via RESPIRATORY_TRACT
  Filled 2012-08-01 (×6): qty 2

## 2012-08-01 MED ORDER — SIMVASTATIN 20 MG PO TABS
20.0000 mg | ORAL_TABLET | Freq: Every day | ORAL | Status: DC
  Filled 2012-08-01: qty 1

## 2012-08-01 MED ORDER — PANTOPRAZOLE SODIUM 40 MG PO TBEC
40.0000 mg | DELAYED_RELEASE_TABLET | Freq: Every day | ORAL | Status: DC
  Administered 2012-08-02: 40 mg via ORAL
  Filled 2012-08-01: qty 1

## 2012-08-01 MED ORDER — ALBUTEROL SULFATE (5 MG/ML) 0.5% IN NEBU: 2.5000 mg | INHALATION_SOLUTION | Freq: Four times a day (QID) | RESPIRATORY_TRACT | Status: DC | PRN

## 2012-08-01 MED ORDER — ASPIRIN 81 MG PO CHEW
324.0000 mg | CHEWABLE_TABLET | ORAL | Status: AC
  Administered 2012-08-01: 324 mg via ORAL
  Filled 2012-08-01: qty 4

## 2012-08-01 NOTE — Progress Notes (Signed)
RT placed patient on cpap auto 14 max and 7 min. With 2lpm 02 bleed in. Patient is tolerating cpap well at this time. RT will continue to monitor.

## 2012-08-01 NOTE — H&P (Signed)
Lisa Walters is an 71 y.o. female.   Chief Complaint: Exertional dyspnea.  HPI: 71 years old female c/o exertional dyspnea with risk factors of DM, II, Hypertension, Hyperlipidemia, Obesity and CAD.   Past Medical History  Diagnosis Date  . Osteopenia   . Vitamin D deficiency disease   . Breast cancer     left breast  . Irregular heart beat   . Breast cancer 2007  . Urge incontinence   . Asthma   . Hypertension   . Anemia   . Diabetes mellitus   . Hyperlipidemia   . GERD (gastroesophageal reflux disease)   . Vertigo, benign positional   . Sleep apnea       Past Surgical History  Procedure Date  . Knee surgery     3 knee replacement  . Breast lumpectomy   . Tubal ligation     Family History  Problem Relation Age of Onset  . Cancer Sister     brain tumor  . Stroke Father   . Kidney disease Mother   . Emphysema Brother    Social History:  reports that she has never smoked. She has never used smokeless tobacco. She reports that she does not drink alcohol or use illicit drugs.  Allergies:  Allergies  Allergen Reactions  . Codeine     Unknown  . Sulfa Antibiotics     Unknown    Medications Prior to Admission  Medication Sig Dispense Refill  . albuterol (PROVENTIL) (5 MG/ML) 0.5% nebulizer solution Take 2.5 mg by nebulization every 6 (six) hours as needed. For shortness of breath      . albuterol-ipratropium (COMBIVENT) 18-103 MCG/ACT inhaler Inhale 2 puffs into the lungs every 6 (six) hours as needed. For shortness of breath      . budesonide (PULMICORT) 0.5 MG/2ML nebulizer solution Take 0.5 mg by nebulization 2 (two) times daily.      . cholecalciferol (VITAMIN D) 1000 UNITS tablet Take 1,000 Units by mouth daily.      Marland Kitchen diltiazem (TIAZAC) 240 MG 24 hr capsule Take 240 mg by mouth daily.      . furosemide (LASIX) 20 MG tablet Take 20 mg by mouth daily.       . metFORMIN (GLUCOPHAGE) 500 MG tablet Take 500 mg by mouth 2 (two) times daily with a meal.      .  metoprolol tartrate (LOPRESSOR) 25 MG tablet Take 25 mg by mouth 2 (two) times daily.      . montelukast (SINGULAIR) 10 MG tablet Take 10 mg by mouth at bedtime.      Marland Kitchen omeprazole (PRILOSEC) 20 MG capsule Take 20 mg by mouth daily.      . potassium chloride SA (K-DUR,KLOR-CON) 20 MEQ tablet Take 20 mEq by mouth daily.      . simvastatin (ZOCOR) 40 MG tablet Take 40 mg by mouth every evening.      . warfarin (COUMADIN) 5 MG tablet Take 5 mg by mouth daily.        No results found for this or any previous visit (from the past 48 hour(s)). No results found.  @ROS @ + wears reading glasses, + cataract surgery. - hearing loss, + asthma and pneumonia, + palpitation, dizziness hearing lossand chest pain.+ constipation and blood transfusion, + Joint pains. No hearing loss, tinnitus, dentures, GI bleed, kidney stone, CVA, Seizures, Psych admission.  Physical Exam: P-90, R-18  BP-117/71, Ht-5\' 2" . Wt-213-lb, BMI-38. There were no vitals taken for this visit. HEENT:  Advance/AT, Eyes-Brown, PERL, EOMI, Conjunctiva-Pink, Sclera-Non-icteric  Neck: No JVD, No bruit, Trachea midline.  Lungs: Clear, Bilateral.  Cardiac: Regular rhythm, normal S1 and S2, no S3.  Abdomen: Soft, non-tender.  Extremities: No edema present. No cyanosis. No clubbing.  CNS: AxOx3, Cranial nerves grossly intact, moves all 4 extremities. Right handed.  Skin: Warm and dry.   Assessment/Plan Exertional dyspnea CAD Atrial Fibrillation DM, II Hyperlipidemia Obesity  Nuclear stress test v/s C.cath in AM.  Lisa Walters S 08/01/2012, 2:14 PM

## 2012-08-02 ENCOUNTER — Encounter (HOSPITAL_COMMUNITY)
Admission: AD | Disposition: A | Discharge status: Home / Self Care | Payer: Self-pay | Source: Ambulatory Visit | Attending: Cardiovascular Disease

## 2012-08-02 HISTORY — PX: LEFT HEART CATHETERIZATION WITH CORONARY ANGIOGRAM: SHX5451

## 2012-08-02 LAB — CARDIAC PANEL(CRET KIN+CKTOT+MB+TROPI)
CK, MB: 1.3 ng/mL (ref 0.3–4.0)
Relative Index: INVALID (ref 0.0–2.5)
Total CK: 47 U/L (ref 7–177)
Troponin I: <0.3 ng/mL (ref <0.30)

## 2012-08-02 LAB — BASIC METABOLIC PANEL
BUN: 16 mg/dL (ref 6–23)
CO2: 24 mEq/L (ref 19–32)
Chloride: 106 mEq/L (ref 96–112)
Creatinine, Ser: 0.86 mg/dL (ref 0.50–1.10)
Glucose, Bld: 94 mg/dL (ref 70–99)
Potassium: 4.2 mEq/L (ref 3.5–5.1)

## 2012-08-02 LAB — PROTIME-INR
INR: 1.69 — ABNORMAL HIGH (ref 0.00–1.49)
INR: 1.4 (ref 0.00–1.49)
Prothrombin Time: 20.2 seconds — ABNORMAL HIGH (ref 11.6–15.2)
Prothrombin Time: 17.4 seconds — ABNORMAL HIGH (ref 11.6–15.2)

## 2012-08-02 LAB — CBC
MCV: 83.3 fL (ref 78.0–100.0)
Platelets: 250 10*3/uL (ref 150–400)
RBC: 4.61 MIL/uL (ref 3.87–5.11)
RDW: 15.2 % (ref 11.5–15.5)
WBC: 5.9 10*3/uL (ref 4.0–10.5)

## 2012-08-02 LAB — GLUCOSE, CAPILLARY
Glucose-Capillary: 90 mg/dL (ref 70–99)
Glucose-Capillary: 280 mg/dL — ABNORMAL HIGH (ref 70–99)

## 2012-08-02 LAB — HEMOGLOBIN A1C: Mean Plasma Glucose: 154 mg/dL — ABNORMAL HIGH (ref <117)

## 2012-08-02 LAB — LIPID PANEL
Cholesterol: 138 mg/dL (ref 0–200)
Triglycerides: 102 mg/dL (ref <150)

## 2012-08-02 SURGERY — LEFT HEART CATHETERIZATION WITH CORONARY ANGIOGRAM
Anesthesia: Moderate Sedation | Laterality: Right

## 2012-08-02 MED ORDER — SODIUM CHLORIDE 0.9 % IJ SOLN
3.0000 mL | Freq: Two times a day (BID) | INTRAMUSCULAR | Status: DC
  Administered 2012-08-02: 15:00:00 via INTRAVENOUS

## 2012-08-02 MED ORDER — ACETAMINOPHEN 325 MG PO TABS: 650.0000 mg | ORAL_TABLET | ORAL | Status: DC | PRN

## 2012-08-02 MED ORDER — WARFARIN SODIUM 5 MG PO TABS
5.0000 mg | ORAL_TABLET | Freq: Once | ORAL | Status: AC
  Administered 2012-08-02: 5 mg via ORAL
  Filled 2012-08-02: qty 1

## 2012-08-02 MED ORDER — SODIUM CHLORIDE 0.9 % IV SOLN
INTRAVENOUS | Status: DC
  Administered 2012-08-02: 15:00:00 via INTRAVENOUS

## 2012-08-02 MED ORDER — SODIUM CHLORIDE 0.9 % IV SOLN: 1.0000 mL/kg/h | INTRAVENOUS | Status: AC

## 2012-08-02 MED ORDER — VITAMIN K1 10 MG/ML IJ SOLN
2.5000 mg | Freq: Once | INTRAVENOUS | Status: AC
  Administered 2012-08-02: 2.5 mg via INTRAVENOUS
  Filled 2012-08-02: qty 0.25

## 2012-08-02 MED ORDER — HEPARIN (PORCINE) IN NACL 2-0.9 UNIT/ML-% IJ SOLN
INTRAMUSCULAR | Status: AC
  Filled 2012-08-02: qty 2000

## 2012-08-02 MED ORDER — WARFARIN - PHYSICIAN DOSING INPATIENT: Freq: Every day | Status: DC

## 2012-08-02 MED ORDER — FUROSEMIDE 20 MG PO TABS
20.0000 mg | ORAL_TABLET | Freq: Every day | ORAL | Status: DC
  Administered 2012-08-02 – 2012-08-03 (×2): 20 mg via ORAL
  Filled 2012-08-02 (×2): qty 1

## 2012-08-02 MED ORDER — MIDAZOLAM HCL 2 MG/2ML IJ SOLN
INTRAMUSCULAR | Status: AC
  Filled 2012-08-02: qty 2

## 2012-08-02 MED ORDER — LIDOCAINE HCL (PF) 1 % IJ SOLN
INTRAMUSCULAR | Status: AC
  Filled 2012-08-02: qty 30

## 2012-08-02 MED ORDER — SODIUM CHLORIDE 0.9 % IJ SOLN: 3.0000 mL | INTRAMUSCULAR | Status: DC | PRN

## 2012-08-02 MED ORDER — NITROGLYCERIN 0.2 MG/ML ON CALL CATH LAB
INTRAVENOUS | Status: AC
  Filled 2012-08-02: qty 1

## 2012-08-02 MED ORDER — FENTANYL CITRATE 0.05 MG/ML IJ SOLN
INTRAMUSCULAR | Status: AC
  Filled 2012-08-02: qty 2

## 2012-08-02 MED ORDER — SODIUM CHLORIDE 0.9 % IV SOLN: 250.0000 mL | INTRAVENOUS | Status: DC | PRN

## 2012-08-02 MED ORDER — ASPIRIN 81 MG PO CHEW
324.0000 mg | CHEWABLE_TABLET | ORAL | Status: AC
  Administered 2012-08-02: 324 mg via ORAL
  Filled 2012-08-02: qty 4

## 2012-08-02 NOTE — Progress Notes (Signed)
Subjective:  Feeling better. Wants cardiac cath for angina equivalent symptoms.  Objective:  Vital Signs in the last 24 hours: Temp:  [97.6 F (36.4 C)-97.8 F (36.6 C)] 97.6 F (36.4 C) (08/09 0618) Pulse Rate:  [65-69] 65  (08/09 0618) Cardiac Rhythm:  [-] Atrial fibrillation (08/09 0745) Resp:  [18] 18  (08/09 0618) BP: (118-143)/(79-92) 143/92 mmHg (08/09 0618) SpO2:  [94 %-100 %] 99 % (08/09 0800) Weight:  [101.696 kg (224 lb 3.2 oz)-101.923 kg (224 lb 11.2 oz)] 101.696 kg (224 lb 3.2 oz) (08/09 0272)  Physical Exam: BP Readings from Last 1 Encounters:  08/02/12 143/92    Wt Readings from Last 1 Encounters:  08/02/12 101.696 kg (224 lb 3.2 oz)    Weight change:   HEENT: /AT, Eyes-Brown, PERL, EOMI, Conjunctiva-Pink, Sclera-Non-icteric Neck: No JVD, No bruit, Trachea midline. Lungs:  Clear, Bilateral. Cardiac:  Regular rhythm, normal S1 and S2, no S3.  Abdomen:  Soft, non-tender. Extremities:  No edema present. No cyanosis. No clubbing. CNS: AxOx3, Cranial nerves grossly intact, moves all 4 extremities. Right handed. Skin: Warm and dry.   Intake/Output from previous day:      Lab Results: BMET    Component Value Date/Time   NA 137 08/01/2012 1709   K 4.6 08/01/2012 1709   CL 102 08/01/2012 1709   CO2 21 08/01/2012 1709   GLUCOSE 79 08/01/2012 1709   BUN 15 08/01/2012 1709   CREATININE 0.86 08/01/2012 1709   CALCIUM 9.7 08/01/2012 1709   GFRNONAA 68* 08/01/2012 1709   GFRAA 79* 08/01/2012 1709   CBC    Component Value Date/Time   WBC 5.9 08/02/2012 0636   WBC 5.4 01/18/2011 1339   RBC 4.61 08/02/2012 0636   RBC 4.63 01/18/2011 1339   HGB 12.0 08/02/2012 0636   HGB 12.6 01/18/2011 1339   HCT 38.4 08/02/2012 0636   HCT 38.9 01/18/2011 1339   PLT 250 08/02/2012 0636   PLT 240 01/18/2011 1339   MCV 83.3 08/02/2012 0636   MCV 84.1 01/18/2011 1339   MCH 26.0 08/02/2012 0636   MCH 26.7 07/21/2010 1500   MCHC 31.3 08/02/2012 0636   MCHC 32.5 01/18/2011 1339   RDW 15.2 08/02/2012 0636   RDW  15.4* 01/18/2011 1339   LYMPHSABS 2.7 08/01/2012 1709   LYMPHSABS 1.6 01/18/2011 1339   MONOABS 0.4 08/01/2012 1709   MONOABS 0.4 01/18/2011 1339   EOSABS 0.2 08/01/2012 1709   EOSABS 0.2 01/18/2011 1339   BASOSABS 0.0 08/01/2012 1709   BASOSABS 0.0 01/18/2011 1339   CARDIAC ENZYMES Lab Results  Component Value Date   CKTOTAL 47 08/02/2012   CKMB 1.3 08/02/2012   TROPONINI <0.30 08/02/2012    Assessment/Plan:  Patient Active Hospital Problem List:  Exertional dyspnea  CAD  Atrial Fibrillation  DM, II  Hyperlipidemia  Obesity  C. Cath today.   LOS: 1 day    Orpah Cobb  MD  08/02/2012, 3:41 PM

## 2012-08-02 NOTE — CV Procedure (Signed)
PROCEDURE:  Left heart catheterization with selective coronary angiography, left ventriculogram.  CLINICAL HISTORY:  This is a 71 years old female with exertional dyspnea with diabetes, obesity, hyperlipidemia and hypertension.  The risks, benefits, and details of the procedure were explained to the patient.  The patient verbalized understanding and wanted to proceed.  Informed written consent was obtained.  PROCEDURE TECHNIQUE:  The patient was approached from the right femoral artery using a 5 French short sheath.  Left coronary angiography was done using a Judkins L4 guide catheter.  Right coronary angiography was done using a Judkins R4 guide catheter.  Left ventriculography was done using a pigtail catheter.    CONTRAST:  Total of 40 cc.  COMPLICATIONS:  None.  At the end of the procedure a manual device was used for hemostasis.    HEMODYNAMICS:  Aortic pressure was 142/95; LV pressure was 159/18; LVEDP 27.  There was no gradient between the left ventricle and aorta.    ANGIOGRAM/CORONARY ARTERIOGRAM:   The left main coronary artery is normal.  The left anterior descending artery has proximal luminal irregularities otherwise unremarkable.  The left circumflex artery is unremarkable and codominant.  The right coronary artery is codominant with proximal luminal irregularities.Marland Kitchen  LEFT VENTRICULOGRAM:  Left ventricular angiogram was done in the 30 RAO projection and revealed normal left ventricular wall motion and systolic function with an estimated ejection fraction of 70 %.  LVEDP was 27 mmHg.  IMPRESSION OF HEART CATHETERIZATION:   1. Normal left main coronary artery. 2. Mild disease left anterior descending artery and normal branches. 3. Normal left circumflex artery and its branches. 4. Mild disease of right coronary artery. 5. Normal left ventricular systolic function.  LVEDP 27 mmHg.  Ejection fraction 70 %.  RECOMMENDATION:   Medical treatment with life style modification.

## 2012-08-03 LAB — GLUCOSE, CAPILLARY

## 2012-08-03 NOTE — Discharge Summary (Signed)
Physician Discharge Summary  Patient ID: Lisa Walters MRN: [id number] DOB/AGE: [birth year] 71 y.o.  Admit date: 08/01/2012 Discharge date: 08/03/2012  Admission Diagnoses: Exertional dyspnea  CAD  Atrial Fibrillation  DM, II  Hyperlipidemia  Obesity  Discharge Diagnoses:  Active Problems:  * Exertional dyspnea secondary to obesity and hypertrophic cardiomyoppathy *-Principle diagnosis  CAD, Mild  Atrial Fibrillation  DM, II  Hyperlipidemia  Obesity   Discharged Condition: good  Hospital Course: 71 years old female with long standing history of exertional dyspnea worsening over few weeks. She underwent cardiac cath showing mild coronary artery disease and hypertrophic cardiomyopathy. She was discharged home with life style modification.  Consults: None  Significant Diagnostic Studies: labs: Normal CBC, Chemistry and cardiac enzymes and angiography: Mild 2 vessel CAD.  Treatments: cardiac meds: metoprolol, diltiazem and furosemide and procedures: left heart catheterization with angiography.  Discharge Exam: Blood pressure 122/85, pulse 82, temperature 97.9 F (36.6 C), temperature source Oral, resp. rate 18, height 5\' 2"  (1.575 m), weight 101.696 kg (224 lb 3.2 oz), SpO2 97.00%. HEENT: [place]/AT, Eyes-Brown, PERL, EOMI, Conjunctiva-Pink, Sclera-Non-icteric  Neck: No JVD, No bruit, Trachea midline.  Lungs: Clear, Bilateral.  Cardiac: Regular rhythm, normal S1 and S2, no S3.  Abdomen: Soft, non-tender.  Extremities: No edema present. No cyanosis. No clubbing. No hematoma. CNS: AxOx3, Cranial nerves grossly intact, moves all 4 extremities. Right handed.  Skin: Warm and dry.   Disposition: 01-Home or Self Care   Medication List  As of 08/03/2012 10:38 AM   STOP taking these medications         metFORMIN 500 MG tablet         TAKE these medications         albuterol (5 MG/ML) 0.5% nebulizer solution   Commonly known as: PROVENTIL   Take 2.5 mg by nebulization every 6  (six) hours as needed. For shortness of breath      albuterol-ipratropium 18-103 MCG/ACT inhaler   Commonly known as: COMBIVENT   Inhale 2 puffs into the lungs every 6 (six) hours as needed. For shortness of breath      budesonide 0.5 MG/2ML nebulizer solution   Commonly known as: PULMICORT   Take 0.5 mg by nebulization 2 (two) times daily.      cholecalciferol 1000 UNITS tablet   Commonly known as: VITAMIN D   Take 1,000 Units by mouth daily.      diltiazem 240 MG 24 hr capsule   Commonly known as: TIAZAC   Take 240 mg by mouth daily.      furosemide 20 MG tablet   Commonly known as: LASIX   Take 20 mg by mouth daily.      metoprolol tartrate 25 MG tablet   Commonly known as: LOPRESSOR   Take 25 mg by mouth 2 (two) times daily.      montelukast 10 MG tablet   Commonly known as: SINGULAIR   Take 10 mg by mouth at bedtime.      omeprazole 20 MG capsule   Commonly known as: PRILOSEC   Take 20 mg by mouth daily.      potassium chloride SA 20 MEQ tablet   Commonly known as: K-DUR,KLOR-CON   Take 20 mEq by mouth daily.      simvastatin 40 MG tablet   Commonly known as: ZOCOR   Take 40 mg by mouth every evening.      warfarin 5 MG tablet   Commonly known as: COUMADIN   Take 5  mg by mouth daily.             SignedOrpah Cobb S 08/03/2012, 10:38 AM

## 2012-08-05 MED FILL — Hydrocodone-Acetaminophen Tab 5-325 MG: ORAL | Qty: 1 | Status: AC

## 2012-09-06 ENCOUNTER — Other Ambulatory Visit: Payer: Self-pay | Admitting: Oncology

## 2012-09-06 DIAGNOSIS — Z9889 Other specified postprocedural states: Secondary | ICD-10-CM

## 2012-09-06 DIAGNOSIS — Z853 Personal history of malignant neoplasm of breast: Secondary | ICD-10-CM

## 2012-09-11 ENCOUNTER — Ambulatory Visit
Admission: RE | Admit: 2012-09-11 | Discharge: 2012-09-11 | Disposition: A | Discharge status: Home / Self Care | Payer: Medicare Other | Source: Ambulatory Visit | Attending: Oncology | Admitting: Oncology

## 2012-09-11 DIAGNOSIS — Z9889 Other specified postprocedural states: Secondary | ICD-10-CM

## 2012-09-11 DIAGNOSIS — Z853 Personal history of malignant neoplasm of breast: Secondary | ICD-10-CM

## 2013-03-29 ENCOUNTER — Emergency Department (HOSPITAL_COMMUNITY)
Admission: EM | Admit: 2013-03-29 | Discharge: 2013-03-29 | Disposition: A | Discharge status: Home / Self Care | Payer: Medicare Other | Attending: Emergency Medicine | Admitting: Emergency Medicine

## 2013-03-29 ENCOUNTER — Encounter (HOSPITAL_COMMUNITY): Payer: Self-pay | Admitting: *Deleted

## 2013-03-29 DIAGNOSIS — M129 Arthropathy, unspecified: Secondary | ICD-10-CM | POA: Insufficient documentation

## 2013-03-29 DIAGNOSIS — I509 Heart failure, unspecified: Secondary | ICD-10-CM | POA: Insufficient documentation

## 2013-03-29 DIAGNOSIS — IMO0002 Reserved for concepts with insufficient information to code with codable children: Secondary | ICD-10-CM | POA: Insufficient documentation

## 2013-03-29 DIAGNOSIS — Z8679 Personal history of other diseases of the circulatory system: Secondary | ICD-10-CM | POA: Insufficient documentation

## 2013-03-29 DIAGNOSIS — E559 Vitamin D deficiency, unspecified: Secondary | ICD-10-CM | POA: Insufficient documentation

## 2013-03-29 DIAGNOSIS — Z8669 Personal history of other diseases of the nervous system and sense organs: Secondary | ICD-10-CM | POA: Insufficient documentation

## 2013-03-29 DIAGNOSIS — K219 Gastro-esophageal reflux disease without esophagitis: Secondary | ICD-10-CM | POA: Insufficient documentation

## 2013-03-29 DIAGNOSIS — Z79899 Other long term (current) drug therapy: Secondary | ICD-10-CM | POA: Insufficient documentation

## 2013-03-29 DIAGNOSIS — Z862 Personal history of diseases of the blood and blood-forming organs and certain disorders involving the immune mechanism: Secondary | ICD-10-CM | POA: Insufficient documentation

## 2013-03-29 DIAGNOSIS — E785 Hyperlipidemia, unspecified: Secondary | ICD-10-CM | POA: Insufficient documentation

## 2013-03-29 DIAGNOSIS — I10 Essential (primary) hypertension: Secondary | ICD-10-CM | POA: Insufficient documentation

## 2013-03-29 DIAGNOSIS — Z8739 Personal history of other diseases of the musculoskeletal system and connective tissue: Secondary | ICD-10-CM | POA: Insufficient documentation

## 2013-03-29 DIAGNOSIS — Z7901 Long term (current) use of anticoagulants: Secondary | ICD-10-CM | POA: Insufficient documentation

## 2013-03-29 DIAGNOSIS — J45909 Unspecified asthma, uncomplicated: Secondary | ICD-10-CM | POA: Insufficient documentation

## 2013-03-29 DIAGNOSIS — Z8701 Personal history of pneumonia (recurrent): Secondary | ICD-10-CM | POA: Insufficient documentation

## 2013-03-29 DIAGNOSIS — Z853 Personal history of malignant neoplasm of breast: Secondary | ICD-10-CM | POA: Insufficient documentation

## 2013-03-29 DIAGNOSIS — Z8709 Personal history of other diseases of the respiratory system: Secondary | ICD-10-CM | POA: Insufficient documentation

## 2013-03-29 DIAGNOSIS — E119 Type 2 diabetes mellitus without complications: Secondary | ICD-10-CM | POA: Insufficient documentation

## 2013-03-29 DIAGNOSIS — R110 Nausea: Secondary | ICD-10-CM | POA: Insufficient documentation

## 2013-03-29 DIAGNOSIS — H81399 Other peripheral vertigo, unspecified ear: Secondary | ICD-10-CM | POA: Insufficient documentation

## 2013-03-29 DIAGNOSIS — Z87448 Personal history of other diseases of urinary system: Secondary | ICD-10-CM | POA: Insufficient documentation

## 2013-03-29 MED ORDER — METOCLOPRAMIDE HCL 5 MG/ML IJ SOLN
10.0000 mg | Freq: Once | INTRAMUSCULAR | Status: AC
  Administered 2013-03-29: 10 mg via INTRAMUSCULAR
  Filled 2013-03-29: qty 2

## 2013-03-29 MED ORDER — DIAZEPAM 5 MG PO TABS: 5.0000 mg | ORAL_TABLET | Freq: Four times a day (QID) | ORAL | Status: DC | PRN

## 2013-03-29 MED ORDER — MECLIZINE HCL 25 MG PO TABS: 25.0000 mg | ORAL_TABLET | Freq: Four times a day (QID) | ORAL | Status: DC | PRN

## 2013-03-29 MED ORDER — MECLIZINE HCL 25 MG PO TABS
25.0000 mg | ORAL_TABLET | Freq: Once | ORAL | Status: AC
  Administered 2013-03-29: 25 mg via ORAL
  Filled 2013-03-29: qty 1

## 2013-03-29 NOTE — ED Provider Notes (Signed)
History     CSN: [id number]  Arrival date & time 03/29/13  1444   First MD Initiated Contact with Patient 03/29/13 1459      Chief Complaint  Patient presents with  . Dizziness    out of meds    (Consider location/radiation/quality/duration/timing/severity/associated sxs/prior treatment) HPI This 72 year old female has a history of peripheral vertigo, her last episode was about a year ago, she states 2 nights ago she developed some mild positional vertigo with some nausea but no evidence of other associated symptoms, yesterday during the day she had some very mild vertigo overnight last night and today she is moderately severe vertigo at times with position changes but no vertigo she stays still, she is no headache no confusion no change in speech vision swallowing or understanding no lateralizing or focal weakness numbness or coordination, she is able to walk unassisted today, she ran out of her Antivert this morning and had no improvement from that this morning so came to the ED, she is no chest pain no shortness of breath no syncope no trauma. Her symptoms are resolved if she stays still recur with mild to moderate symptoms with head position changes and body movement. Past Medical History  Diagnosis Date  . Osteopenia   . Vitamin D deficiency disease   . Breast cancer     left breast  . Irregular heart beat   . Breast cancer 2007  . Urge incontinence   . Asthma   . Hypertension   . Anemia   . Hyperlipidemia   . GERD (gastroesophageal reflux disease)   . Vertigo, benign positional   . Sleep apnea   . CHF (congestive heart failure)   . Shortness of breath   . Pneumonia     HX OF pna  . Diabetes mellitus     type 2  . Headache     hx of migraines  . Arthritis     Past Surgical History  Procedure Laterality Date  . Knee surgery      3 knee replacement  . Breast lumpectomy    . Tubal ligation      Family History  Problem Relation Age of Onset  . Cancer Sister    brain tumor  . Stroke Father   . Kidney disease Mother   . Emphysema Brother     History  Substance Use Topics  . Smoking status: Never Smoker   . Smokeless tobacco: Never Used  . Alcohol Use: No    OB History   Grav Para Term Preterm Abortions TAB SAB Ect Mult Living   4 4 4       4       Review of Systems 10 Systems reviewed and are negative for acute change except as noted in the HPI. Allergies  Codeine and Sulfa antibiotics  Home Medications   Current Outpatient Rx  Name  Route  Sig  Dispense  Refill  . albuterol (PROVENTIL) (5 MG/ML) 0.5% nebulizer solution   Nebulization   Take 2.5 mg by nebulization every 6 (six) hours as needed for wheezing or shortness of breath.          Marland Kitchen albuterol-ipratropium (COMBIVENT) 18-103 MCG/ACT inhaler   Inhalation   Inhale 2 puffs into the lungs every 6 (six) hours as needed for wheezing or shortness of breath.          . budesonide (PULMICORT) 0.5 MG/2ML nebulizer solution   Nebulization   Take 0.5 mg by nebulization 2 (two) times  daily.         . cholecalciferol (VITAMIN D) 1000 UNITS tablet   Oral   Take 1,000 Units by mouth daily.         Marland Kitchen diltiazem (TIAZAC) 240 MG 24 hr capsule   Oral   Take 240 mg by mouth daily.         . furosemide (LASIX) 20 MG tablet   Oral   Take 20 mg by mouth daily.          Marland Kitchen ipratropium-albuterol (DUONEB) 0.5-2.5 (3) MG/3ML SOLN   Nebulization   Take 3 mLs by nebulization every 6 (six) hours as needed (wheezing/shortness of breath).         Marland Kitchen lisinopril (PRINIVIL,ZESTRIL) 2.5 MG tablet   Oral   Take 2.5 mg by mouth daily.         . metFORMIN (GLUCOPHAGE) 500 MG tablet   Oral   Take 500 mg by mouth 2 (two) times daily with a meal.         . metoprolol tartrate (LOPRESSOR) 25 MG tablet   Oral   Take 25 mg by mouth 2 (two) times daily.         . montelukast (SINGULAIR) 10 MG tablet   Oral   Take 10 mg by mouth every morning.          Marland Kitchen omeprazole (PRILOSEC)  20 MG capsule   Oral   Take 20 mg by mouth daily.         . potassium chloride SA (K-DUR,KLOR-CON) 20 MEQ tablet   Oral   Take 20 mEq by mouth daily.         . simvastatin (ZOCOR) 40 MG tablet   Oral   Take 40 mg by mouth every evening.         . warfarin (COUMADIN) 5 MG tablet   Oral   Take 5 mg by mouth every evening.          . diazepam (VALIUM) 5 MG tablet   Oral   Take 1 tablet (5 mg total) by mouth every 6 (six) hours as needed (vertigo).   10 tablet   0   . meclizine (ANTIVERT) 25 MG tablet   Oral   Take 1 tablet (25 mg total) by mouth every 6 (six) hours as needed for dizziness.   20 tablet   0     BP 110/65  Pulse 62  Temp(Src) 98.1 F (36.7 C) (Oral)  Resp 20  SpO2 100%  Physical Exam  Nursing note and vitals reviewed. Constitutional:  Awake, alert, nontoxic appearance with baseline speech for patient.  HENT:  Head: Atraumatic.  Mouth/Throat: No oropharyngeal exudate.  Eyes: EOM are normal. Pupils are equal, round, and reactive to light. Right eye exhibits no discharge. Left eye exhibits no discharge.  Neck: Neck supple.  Cardiovascular: Normal rate and regular rhythm.   No murmur heard. Pulmonary/Chest: Effort normal and breath sounds normal. No stridor. No respiratory distress. She has no wheezes. She has no rales. She exhibits no tenderness.  Abdominal: Soft. Bowel sounds are normal. She exhibits no mass. There is no tenderness. There is no rebound.  Musculoskeletal: She exhibits no tenderness.  Baseline ROM, moves extremities with no obvious new focal weakness.  Lymphadenopathy:    She has no cervical adenopathy.  Neurological: She is alert.  Awake, alert, cooperative and aware of situation; motor strength 5/5 bilaterally; sensation normal to light touch bilaterally; peripheral visual fields full to confrontation; no  facial asymmetry; tongue midline; major cranial nerves appear intact; no pronator drift, normal finger to nose bilaterally,  she does have several beats of lateral nystagmus without multidirectional nystagmus and had normal test of skew with no disconjugate eye movements  Skin: No rash noted.  Psychiatric: She has a normal mood and affect.    ED Course  Procedures (including critical care time) Much better in ED, no vertigo, gait is normal. Patient / Family / Caregiver informed of clinical course, understand medical decision-making process, and agree with plan. Labs Reviewed - No data to display No results found.   1. Peripheral vertigo, unspecified laterality       MDM  I doubt any other EMC precluding discharge at this time including, but not necessarily limited to the following:SAH, CVA, SBI.        Hurman Horn, MD 03/31/13 [phone]

## 2013-03-29 NOTE — ED Notes (Signed)
Pt reports a Hx of vertigo and is out of meds . Pt reports feeling dizzy and had blurred vision starting on Friday.  Pt reports it has been a year since last had med for vertigo. Pt denies vomiting or diarrhea.

## 2013-05-26 ENCOUNTER — Emergency Department (HOSPITAL_COMMUNITY): Payer: Medicare Other

## 2013-05-26 ENCOUNTER — Emergency Department (HOSPITAL_COMMUNITY)
Admission: EM | Admit: 2013-05-26 | Discharge: 2013-05-26 | Disposition: A | Discharge status: Home / Self Care | Payer: Medicare Other | Attending: Emergency Medicine | Admitting: Emergency Medicine

## 2013-05-26 DIAGNOSIS — S52609B Unspecified fracture of lower end of unspecified ulna, initial encounter for open fracture type I or II: Secondary | ICD-10-CM | POA: Insufficient documentation

## 2013-05-26 DIAGNOSIS — E785 Hyperlipidemia, unspecified: Secondary | ICD-10-CM | POA: Insufficient documentation

## 2013-05-26 DIAGNOSIS — I10 Essential (primary) hypertension: Secondary | ICD-10-CM | POA: Insufficient documentation

## 2013-05-26 DIAGNOSIS — W010XXA Fall on same level from slipping, tripping and stumbling without subsequent striking against object, initial encounter: Secondary | ICD-10-CM | POA: Insufficient documentation

## 2013-05-26 DIAGNOSIS — Z8701 Personal history of pneumonia (recurrent): Secondary | ICD-10-CM | POA: Insufficient documentation

## 2013-05-26 DIAGNOSIS — Z79899 Other long term (current) drug therapy: Secondary | ICD-10-CM | POA: Insufficient documentation

## 2013-05-26 DIAGNOSIS — E559 Vitamin D deficiency, unspecified: Secondary | ICD-10-CM | POA: Insufficient documentation

## 2013-05-26 DIAGNOSIS — Z7901 Long term (current) use of anticoagulants: Secondary | ICD-10-CM | POA: Insufficient documentation

## 2013-05-26 DIAGNOSIS — I509 Heart failure, unspecified: Secondary | ICD-10-CM | POA: Insufficient documentation

## 2013-05-26 DIAGNOSIS — J45909 Unspecified asthma, uncomplicated: Secondary | ICD-10-CM | POA: Insufficient documentation

## 2013-05-26 DIAGNOSIS — Z8669 Personal history of other diseases of the nervous system and sense organs: Secondary | ICD-10-CM | POA: Insufficient documentation

## 2013-05-26 DIAGNOSIS — S52509A Unspecified fracture of the lower end of unspecified radius, initial encounter for closed fracture: Secondary | ICD-10-CM

## 2013-05-26 DIAGNOSIS — R05 Cough: Secondary | ICD-10-CM | POA: Insufficient documentation

## 2013-05-26 DIAGNOSIS — R059 Cough, unspecified: Secondary | ICD-10-CM | POA: Insufficient documentation

## 2013-05-26 DIAGNOSIS — Z853 Personal history of malignant neoplasm of breast: Secondary | ICD-10-CM | POA: Insufficient documentation

## 2013-05-26 DIAGNOSIS — K219 Gastro-esophageal reflux disease without esophagitis: Secondary | ICD-10-CM | POA: Insufficient documentation

## 2013-05-26 DIAGNOSIS — Y92410 Unspecified street and highway as the place of occurrence of the external cause: Secondary | ICD-10-CM | POA: Insufficient documentation

## 2013-05-26 DIAGNOSIS — S52502B Unspecified fracture of the lower end of left radius, initial encounter for open fracture type I or II: Secondary | ICD-10-CM

## 2013-05-26 DIAGNOSIS — S52611B Displaced fracture of right ulna styloid process, initial encounter for open fracture type I or II: Secondary | ICD-10-CM

## 2013-05-26 DIAGNOSIS — M899 Disorder of bone, unspecified: Secondary | ICD-10-CM | POA: Insufficient documentation

## 2013-05-26 DIAGNOSIS — Y9301 Activity, walking, marching and hiking: Secondary | ICD-10-CM | POA: Insufficient documentation

## 2013-05-26 DIAGNOSIS — S5290XB Unspecified fracture of unspecified forearm, initial encounter for open fracture type I or II: Secondary | ICD-10-CM | POA: Insufficient documentation

## 2013-05-26 DIAGNOSIS — Z8679 Personal history of other diseases of the circulatory system: Secondary | ICD-10-CM | POA: Insufficient documentation

## 2013-05-26 DIAGNOSIS — Z8739 Personal history of other diseases of the musculoskeletal system and connective tissue: Secondary | ICD-10-CM | POA: Insufficient documentation

## 2013-05-26 DIAGNOSIS — Z862 Personal history of diseases of the blood and blood-forming organs and certain disorders involving the immune mechanism: Secondary | ICD-10-CM | POA: Insufficient documentation

## 2013-05-26 DIAGNOSIS — E119 Type 2 diabetes mellitus without complications: Secondary | ICD-10-CM | POA: Insufficient documentation

## 2013-05-26 DIAGNOSIS — G4730 Sleep apnea, unspecified: Secondary | ICD-10-CM | POA: Insufficient documentation

## 2013-05-26 HISTORY — DX: Unspecified fracture of the lower end of unspecified radius, initial encounter for closed fracture: S52.509A

## 2013-05-26 LAB — CBC WITH DIFFERENTIAL/PLATELET
Eosinophils Absolute: 0.1 10*3/uL (ref 0.0–0.7)
Eosinophils Relative: 1 % (ref 0–5)
Hemoglobin: 11.9 g/dL — ABNORMAL LOW (ref 12.0–15.0)
Lymphs Abs: 1.6 10*3/uL (ref 0.7–4.0)
MCH: 25.8 pg — ABNORMAL LOW (ref 26.0–34.0)
MCV: 82.3 fL (ref 78.0–100.0)
Monocytes Relative: 5 % (ref 3–12)
Neutrophils Relative %: 68 % (ref 43–77)
RBC: 4.62 MIL/uL (ref 3.87–5.11)

## 2013-05-26 LAB — POCT I-STAT, CHEM 8
Chloride: 107 mEq/L (ref 96–112)
Glucose, Bld: 131 mg/dL — ABNORMAL HIGH (ref 70–99)
HCT: 39 % (ref 36.0–46.0)
Potassium: 3.8 mEq/L (ref 3.5–5.1)
Sodium: 141 mEq/L (ref 135–145)

## 2013-05-26 LAB — PROTIME-INR: INR: 2.84 — ABNORMAL HIGH (ref 0.00–1.49)

## 2013-05-26 MED ORDER — HYDROMORPHONE HCL PF 1 MG/ML IJ SOLN
1.0000 mg | Freq: Once | INTRAMUSCULAR | Status: AC
  Administered 2013-05-26: 1 mg via INTRAVENOUS
  Filled 2013-05-26: qty 1

## 2013-05-26 MED ORDER — FUROSEMIDE 10 MG/ML IJ SOLN
20.0000 mg | Freq: Once | INTRAMUSCULAR | Status: AC
  Administered 2013-05-26: 20 mg via INTRAVENOUS
  Filled 2013-05-26: qty 2

## 2013-05-26 MED ORDER — ONDANSETRON HCL 4 MG PO TABS: ORAL_TABLET | ORAL | Status: DC

## 2013-05-26 MED ORDER — HYDROMORPHONE HCL PF 1 MG/ML IJ SOLN
1.0000 mg | Freq: Once | INTRAMUSCULAR | Status: DC
  Filled 2013-05-26: qty 1

## 2013-05-26 MED ORDER — ONDANSETRON HCL 4 MG/2ML IJ SOLN
4.0000 mg | Freq: Once | INTRAMUSCULAR | Status: AC
  Administered 2013-05-26: 4 mg via INTRAVENOUS
  Filled 2013-05-26: qty 2

## 2013-05-26 MED ORDER — ALBUTEROL SULFATE (5 MG/ML) 0.5% IN NEBU
2.5000 mg | INHALATION_SOLUTION | Freq: Once | RESPIRATORY_TRACT | Status: AC
  Administered 2013-05-26: 2.5 mg via RESPIRATORY_TRACT
  Filled 2013-05-26: qty 0.5

## 2013-05-26 MED ORDER — DIAZEPAM 5 MG PO TABS
ORAL_TABLET | ORAL | Status: DC
Start: 2013-05-26 — End: 2013-05-31

## 2013-05-26 MED ORDER — CEFAZOLIN SODIUM 1-5 GM-% IV SOLN
1.0000 g | Freq: Once | INTRAVENOUS | Status: AC
  Administered 2013-05-26: 1 g via INTRAVENOUS
  Filled 2013-05-26: qty 50

## 2013-05-26 MED ORDER — HYDROCODONE-ACETAMINOPHEN 5-325 MG PO TABS
1.0000 | ORAL_TABLET | Freq: Once | ORAL | Status: AC
  Administered 2013-05-26: 1 via ORAL
  Filled 2013-05-26: qty 1

## 2013-05-26 MED ORDER — OXYCODONE-ACETAMINOPHEN 5-325 MG PO TABS: ORAL_TABLET | ORAL | Status: DC

## 2013-05-26 MED ORDER — CEFAZOLIN SODIUM 1 G IJ SOLR: 1.0000 g | Freq: Once | INTRAMUSCULAR | Status: DC

## 2013-05-26 NOTE — Progress Notes (Signed)
Orthopedic Tech Progress Note Patient Details:  Lisa Walters Sep 12, 1941 [id number]  Ortho Devices Type of Ortho Device: Ace wrap;Volar splint Ortho Device/Splint Location: right hand/dorsal and distal Ortho Device/Splint Interventions: Application As ordered by Dr. Deretha Emory, Ruby Dilone 05/26/2013, 6:06 PM

## 2013-05-26 NOTE — Consult Note (Signed)
Reason for Consult:R wrist fx Referring Physician: ER  CC: I hurt my wrist.  Lisa Walters is an 72 y.o. right handed female.  HPI: pt fell in some mud today falling onto outstretched R hand, denies other injuries, small amount of bleeding from wrist, pain 10/10 initially, denies numbness of fingers, pt was being evaluated today for monthly coumadin check at her MD's office, currenlty no c/o CB, SOB, pain better in R wrist after pain medication.  Past Medical History  Diagnosis Date  . Osteopenia   . Vitamin D deficiency disease   . Breast cancer     left breast  . Irregular heart beat   . Breast cancer 2007  . Urge incontinence   . Asthma   . Hypertension   . Anemia   . Hyperlipidemia   . GERD (gastroesophageal reflux disease)   . Vertigo, benign positional   . Sleep apnea   . CHF (congestive heart failure)   . Shortness of breath   . Pneumonia     HX OF pna  . Diabetes mellitus     type 2  . Headache     hx of migraines  . Arthritis     Past Surgical History  Procedure Laterality Date  . Knee surgery      3 knee replacement  . Breast lumpectomy    . Tubal ligation      Family History  Problem Relation Age of Onset  . Cancer Sister     brain tumor  . Stroke Father   . Kidney disease Mother   . Emphysema Brother     Social History:  reports that she has never smoked. She has never used smokeless tobacco. She reports that she does not drink alcohol or use illicit drugs.  Allergies:  Allergies  Allergen Reactions  . Codeine     Unknown  . Sulfa Antibiotics     Unknown    Medications: I have reviewed the patient's current medications.  Results for orders placed during the hospital encounter of 05/26/13 (from the past 48 hour(s))  CBC WITH DIFFERENTIAL     Status: Abnormal   Collection Time    05/26/13  4:41 PM      Result Value Range   WBC 6.3  4.0 - 10.5 K/uL   RBC 4.62  3.87 - 5.11 MIL/uL   Hemoglobin 11.9 (*) 12.0 - 15.0 g/dL   HCT 16.1  09.6 -  04.5 %   MCV 82.3  78.0 - 100.0 fL   MCH 25.8 (*) 26.0 - 34.0 pg   MCHC 31.3  30.0 - 36.0 g/dL   RDW 40.9 (*) 81.1 - 91.4 %   Platelets 250  150 - 400 K/uL   Neutrophils Relative % 68  43 - 77 %   Neutro Abs 4.3  1.7 - 7.7 K/uL   Lymphocytes Relative 25  12 - 46 %   Lymphs Abs 1.6  0.7 - 4.0 K/uL   Monocytes Relative 5  3 - 12 %   Monocytes Absolute 0.3  0.1 - 1.0 K/uL   Eosinophils Relative 1  0 - 5 %   Eosinophils Absolute 0.1  0.0 - 0.7 K/uL   Basophils Relative 0  0 - 1 %   Basophils Absolute 0.0  0.0 - 0.1 K/uL  PROTIME-INR     Status: Abnormal   Collection Time    05/26/13  4:41 PM      Result Value Range   Prothrombin Time  28.4 (*) 11.6 - 15.2 seconds   INR 2.84 (*) 0.00 - 1.49  PRO B NATRIURETIC PEPTIDE     Status: Abnormal   Collection Time    05/26/13  4:41 PM      Result Value Range   Pro B Natriuretic peptide (BNP) 973.7 (*) 0 - 125 pg/mL  POCT I-STAT, CHEM 8     Status: Abnormal   Collection Time    05/26/13  5:11 PM      Result Value Range   Sodium 141  135 - 145 mEq/L   Potassium 3.8  3.5 - 5.1 mEq/L   Chloride 107  96 - 112 mEq/L   BUN 18  6 - 23 mg/dL   Creatinine, Ser 8.29  0.50 - 1.10 mg/dL   Glucose, Bld 562 (*) 70 - 99 mg/dL   Calcium, Ion 1.30  8.65 - 1.30 mmol/L   TCO2 27  0 - 100 mmol/L   Hemoglobin 13.3  12.0 - 15.0 g/dL   HCT 78.4  69.6 - 29.5 %    Dg Chest 2 View  05/26/2013   *RADIOLOGY REPORT*  Clinical Data: Fall with shortness of breath.  CHEST - 2 VIEW  Comparison: 07/08/2012  Findings: No focal airspace consolidation. The cardiopericardial silhouette is enlarged.  There is vascular congestion with probable interstitial pulmonary edema.  Double aortic arch again noted. Surgical clips are noted in the left axilla. Imaged bony structures of the thorax are intact.  IMPRESSION: Borderline to mild cardiomegaly with vascular congestion and possible interstitial pulmonary edema.   Original Report Authenticated By: Kennith Center, M.D.   Dg Wrist  Complete Right  05/26/2013   *RADIOLOGY REPORT*  Clinical Data: Fall.  Wrist injury.  RIGHT WRIST - COMPLETE 3+ VIEW  Comparison: None.  Findings: Three-view exam shows a comminuted fracture of the distal radius with apex anterior angulation.  The fracture likely extends to the distal articular surface.  There is associated avulsion of the ulnar styloid.  Carpal alignment is anatomic.  Degenerative changes are noted in the first carpo-metacarpal joint.  IMPRESSION: Comminuted distal radius fracture with apex anterior angulation and likely intra-articular extension.  Associated fracture of the ulnar styloid.   Original Report Authenticated By: Kennith Center, M.D.    Pertinent items are noted in HPI. Temp:  [97.7 F (36.5 C)] 97.7 F (36.5 C) (06/02 1508) Pulse Rate:  [88-93] 88 (06/02 1530) Resp:  [16-17] 16 (06/02 1530) BP: (123-125)/(73-79) 123/73 mmHg (06/02 1530) SpO2:  [80 %-98 %] 98 % (06/02 1719) General appearance: alert and cooperative Resp: no resp distress, no audible wheezing Cardio: irregularly irregular rhythm GI: soft, non-tender; bowel sounds normal; no masses,  no organomegaly Extremities: extremities normal, atraumatic, no cyanosis or edema Except for R wrist with swelling, mild dorsal deformity, 0.5cm laceration of dorsal hand over distal ulna, n/v intact distally  Assessment/Plan: R distal radius fracture, GI open  Plan:  Due to pt's comorbidities, will reduce with hematoma block and splint.   Procedure:   10cc 2% lidocaine injected into the fracture site dorsally after the wrist was prepped with betadine.  Inline traction and dorsal pressure was applied to the distal radius with reduction.  The open wound was cleansed with betadine and antibiotic ointment , zeroform and a sterile dressing was applied.  A volar/dorsal splint was placed.  Post procedure xray is pending.  Lisa Walters,Lisa Walters CHRISTOPHER 05/26/2013, 5:45 PM

## 2013-05-26 NOTE — ED Notes (Addendum)
Pt in from [place], MD office, per report pt being seen for regular scheduled visit, pt c/o SOB & productive cough with intermittent brown & white sputum that pt reports onset "months ago", pt was attempting to go to X ray & fell landing on R wrist, pt has 1 cm wound to R wrist, bleeding controlled, no obvious deformity, pt guarding R wrist, +PS, Limited ROM, pt able to wiggle fingers, pt A&O x 4, follows commands, speaks in complete sentences

## 2013-05-26 NOTE — ED Notes (Signed)
Pt transported to radiology.

## 2013-05-26 NOTE — ED Provider Notes (Signed)
History     CSN: [id number]  Arrival date & time 05/26/13  1456   None     Chief Complaint  Patient presents with  . Wrist Pain    (Consider location/radiation/quality/duration/timing/severity/associated sxs/prior treatment) HPI Comments: 72 y.o. 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 Pt was at her cardiologist for regular appt to check coumadin levels (Dr. Algie Coffer) and c/o SOB and productive cough with white and brown sputum for the last several months. Pt had left office, started to walk down the street to go for a chest xray and slipped in some mud, getting her hands out in front of her. Pt did not hit head. No LOC. A&O x4. Pain to right wrist is severe 10/10. Constant. Worse with movement.   Admits cough, mild numbness to finger tips. Denies shortness of breath, chest pain, dizziness, nausea, vomiting, no visual disurbances, no headache.   Patient is a 72 y.o. female presenting with wrist pain. The history is provided by the patient.  Wrist Pain This is a new problem. The current episode started today. The problem occurs constantly. The problem has been gradually worsening. Associated symptoms include arthralgias and coughing. Pertinent negatives include no chest pain, diaphoresis, fever, headaches, nausea, neck pain, numbness, rash, vomiting or weakness. Exacerbated by: worse with movement. She has tried nothing for the symptoms.    Past Medical History  Diagnosis Date  . Osteopenia   . Vitamin D deficiency disease   . Breast cancer     left breast  . Irregular heart beat   . Breast cancer 2007  . Urge incontinence   . Asthma   . Hypertension   . Anemia   . Hyperlipidemia   . GERD (gastroesophageal reflux disease)   . Vertigo, benign positional   . Sleep apnea   . CHF (congestive heart failure)   . Shortness of breath   . Pneumonia     HX OF pna  . Diabetes mellitus     type  2  . Headache     hx of migraines  . Arthritis     Past Surgical History  Procedure Laterality Date  . Knee surgery      3 knee replacement  . Breast lumpectomy    . Tubal ligation      Family History  Problem Relation Age of Onset  . Cancer Sister     brain tumor  . Stroke Father   . Kidney disease Mother   . Emphysema Brother     History  Substance Use Topics  . Smoking status: Never Smoker   . Smokeless tobacco: Never Used  . Alcohol Use: No    OB History   Grav Para Term Preterm Abortions TAB SAB Ect Mult Living   4 4 4       4       Review of Systems  Constitutional: Negative for fever and diaphoresis.  HENT: Negative for neck pain and neck stiffness.   Eyes: Negative for visual disturbance.  Respiratory: Positive for cough. Negative for apnea, chest tightness and shortness of breath.        Productive  Cardiovascular: Negative for chest pain and palpitations.  Gastrointestinal: Negative for nausea, vomiting, diarrhea and constipation.  Genitourinary: Negative for dysuria.  Musculoskeletal: Positive for arthralgias. Negative for gait problem.       Right wrist  Skin: Positive for wound. Negative for rash.  Right anterior wrist, minor abrasion  Neurological: Negative for dizziness, weakness, light-headedness, numbness and headaches.    Allergies  Codeine and Sulfa antibiotics  Home Medications   Current Outpatient Rx  Name  Route  Sig  Dispense  Refill  . albuterol (PROVENTIL) (5 MG/ML) 0.5% nebulizer solution   Nebulization   Take 2.5 mg by nebulization every 6 (six) hours as needed for wheezing or shortness of breath.          Marland Kitchen albuterol-ipratropium (COMBIVENT) 18-103 MCG/ACT inhaler   Inhalation   Inhale 2 puffs into the lungs every 6 (six) hours as needed for wheezing or shortness of breath.          . budesonide (PULMICORT) 0.5 MG/2ML nebulizer solution   Nebulization   Take 0.5 mg by nebulization 2 (two) times daily.          . cholecalciferol (VITAMIN D) 1000 UNITS tablet   Oral   Take 1,000 Units by mouth daily.         . diazepam (VALIUM) 5 MG tablet   Oral   Take 1 tablet (5 mg total) by mouth every 6 (six) hours as needed (vertigo).   10 tablet   0   . diltiazem (TIAZAC) 240 MG 24 hr capsule   Oral   Take 240 mg by mouth daily.         . furosemide (LASIX) 20 MG tablet   Oral   Take 20 mg by mouth daily.          Marland Kitchen ipratropium-albuterol (DUONEB) 0.5-2.5 (3) MG/3ML SOLN   Nebulization   Take 3 mLs by nebulization every 6 (six) hours as needed (wheezing/shortness of breath).         Marland Kitchen lisinopril (PRINIVIL,ZESTRIL) 2.5 MG tablet   Oral   Take 2.5 mg by mouth daily.         . meclizine (ANTIVERT) 25 MG tablet   Oral   Take 1 tablet (25 mg total) by mouth every 6 (six) hours as needed for dizziness.   20 tablet   0   . metFORMIN (GLUCOPHAGE) 500 MG tablet   Oral   Take 500 mg by mouth 2 (two) times daily with a meal.         . metoprolol tartrate (LOPRESSOR) 25 MG tablet   Oral   Take 25 mg by mouth 2 (two) times daily.         . montelukast (SINGULAIR) 10 MG tablet   Oral   Take 10 mg by mouth every morning.          Marland Kitchen omeprazole (PRILOSEC) 20 MG capsule   Oral   Take 20 mg by mouth daily.         . potassium chloride SA (K-DUR,KLOR-CON) 20 MEQ tablet   Oral   Take 20 mEq by mouth daily.         . simvastatin (ZOCOR) 40 MG tablet   Oral   Take 40 mg by mouth every evening.         . warfarin (COUMADIN) 5 MG tablet   Oral   Take 5 mg by mouth every evening.            BP 125/79  Pulse 93  Temp(Src) 97.7 F (36.5 C) (Oral)  Resp 17  SpO2 97%  Physical Exam  Nursing note and vitals reviewed. Constitutional: She is oriented to person, place, and time. She appears well-developed and well-nourished. No distress.  HENT:  Head:  Normocephalic and atraumatic.  Eyes: Conjunctivae and EOM are normal.  Neck: Normal range of motion. Neck supple.  No  meningeal signs  Cardiovascular: Normal rate, regular rhythm and normal heart sounds.  Exam reveals no gallop and no friction rub.   No murmur heard. Pulmonary/Chest: Effort normal. No respiratory distress. She has no wheezes. She has no rales. She exhibits no tenderness.  Diminished bilateral lung sounds  Abdominal: Soft. Bowel sounds are normal. She exhibits no distension. There is no tenderness. There is no rebound and no guarding.  Musculoskeletal: Normal range of motion. She exhibits no edema and no tenderness.  ROM to right wrist impaired d/t pain. Swelling and deformity noted. Open fx. Ulnar and radial deviation better than flexion.  No step-offs noted on C-spine No new tenderness to palpation of the spinous processes of the C-spine, T-spine or L-spine (pt has chronic back pain) Full range of motion of C-spine, T-spine or L-spine    Neurological: She is alert and oriented to person, place, and time. No cranial nerve deficit.  Speech is clear and goal oriented, follows commands Sensation normal to light touch and two point discrimination Moves extremities without ataxia, coordination intact Normal gait and balance Normal strength in upper and lower extremities bilaterally, able to lift legs against gravity, strong dorsiflexion and plantar flexion, strong grip strength in left hand, weak but able in right   Skin: Skin is warm and dry. She is not diaphoretic. No erythema.  1 cm abrasion to anterior right wrist  Psychiatric: She has a normal mood and affect.    ED Course  Procedures (including critical care time)  Date: 05/26/2013  Rate: 87  Rhythm: atrial fibrillation  QRS Axis: normal  Intervals: normal  ST/T Wave abnormalities: borderline T abnormalities  Conduction Disutrbances:none  Narrative Interpretation: abnormal EKG  Old EKG Reviewed: 08/02/2012, afib HR 78, no mention of T wave abnormalities   Labs Reviewed  CBC WITH DIFFERENTIAL - Abnormal; Notable for the  following:    Hemoglobin 11.9 (*)    MCH 25.8 (*)    RDW 15.7 (*)    All other components within normal limits  PROTIME-INR - Abnormal; Notable for the following:    Prothrombin Time 28.4 (*)    INR 2.84 (*)    All other components within normal limits  PRO B NATRIURETIC PEPTIDE - Abnormal; Notable for the following:    Pro B Natriuretic peptide (BNP) 973.7 (*)    All other components within normal limits  POCT I-STAT, CHEM 8 - Abnormal; Notable for the following:    Glucose, Bld 131 (*)    All other components within normal limits   Dg Chest 2 View  05/26/2013   *RADIOLOGY REPORT*  Clinical Data: Fall with shortness of breath.  CHEST - 2 VIEW  Comparison: 07/08/2012  Findings: No focal airspace consolidation. The cardiopericardial silhouette is enlarged.  There is vascular congestion with probable interstitial pulmonary edema.  Double aortic arch again noted. Surgical clips are noted in the left axilla. Imaged bony structures of the thorax are intact.  IMPRESSION: Borderline to mild cardiomegaly with vascular congestion and possible interstitial pulmonary edema.   Original Report Authenticated By: Kennith Center, M.D.   Dg Wrist Complete Right  05/26/2013   *RADIOLOGY REPORT*  Clinical Data: Fall.  Wrist injury.  RIGHT WRIST - COMPLETE 3+ VIEW  Comparison: None.  Findings: Three-view exam shows a comminuted fracture of the distal radius with apex anterior angulation.  The fracture likely extends to the  distal articular surface.  There is associated avulsion of the ulnar styloid.  Carpal alignment is anatomic.  Degenerative changes are noted in the first carpo-metacarpal joint.  IMPRESSION: Comminuted distal radius fracture with apex anterior angulation and likely intra-articular extension.  Associated fracture of the ulnar styloid.   Original Report Authenticated By: Kennith Center, M.D.     1. Distal radial fracture, left, open type I or II, initial encounter   2. Type I or II open fracture of  right ulnar styloid       MDM  Pt is neurovascularly intact with strong radial pulse in injured right wrist. Appears open fx with wound, swelling and deformity.  Sensation to light touch and two point discrimination intact.  Able to wiggle fingers. Need to stabilize and image. Pain meds. Normal neuro. Pt typically ambulatory without use of a cane or walker. Basic labs d/t her orig visit concerning SOB and cough including CXR. PE reveals diminished b/l lung sounds perhaps d/t body habitus, pain of injury, or underlying condition.   5:03 PM Consult call to Dr. Izora Ribas who will see the pt.   5:53 PM Dr. Izora Ribas at beside. Pt pain is well managed and in no acute distress.   6:53 PM Pt resting comfortably. Pain continues to be manageable at 5/10. No SOB, dyspnea. Short arm splint in place. Dr. Izora Ribas recommends outpt follow up.   Consult call placed to Dr. Algie Coffer (cardiology). Discussed CXR which shows possible interstitial pulmonary edema, increased BNP, and decreased O2 sats to mid 80s on room air. Dr. Algie Coffer requested dose of lasix and for pt to call his office in the morning.   Discussed follow ups with pt and family at bedside with both Dr. Izora Ribas and Dr. Algie Coffer. Discussed use of her pain medication and not to ambulate alone after taking them. Pt lives with granddaughter who was at bedside and will help pt. Pt case also discussed with Dr. Lars Mage who agrees with discharge plan. Discussed reasons to seek immediate care. Patient expresses understanding and agrees with plan.  Glade Nurse, PA-C 05/26/13 2358

## 2013-05-26 NOTE — ED Notes (Signed)
MD at bedside. 

## 2013-05-26 NOTE — ED Notes (Signed)
SpO2 decreased to 88-91% on room air. PA notified.

## 2013-05-26 NOTE — ED Provider Notes (Signed)
Pt was being seen by Dr Algie Coffer for chest pain and SOB. She reports SOB to me "for years". On way to get CXR she fell and injured her right wrist.  Orthopedist Dr Jerl Santos.    Pt has deformity of her right wrist with a wound on the ulnar aspect of her prox right wrist suspicious for open fracture. She has mild numbness in her fingers, she is able to wiggle her fingers. Pulse is intact.  D/W PA, recommend giving ancef IM b/o poor IV patient.   Medical screening examination/treatment/procedure(s) were conducted as a shared visit with non-physician practitioner(s) and myself.  I personally evaluated the patient during the encounter  Devoria Albe, MD, Franz Dell, MD 05/26/13 [phone]

## 2013-05-26 NOTE — ED Notes (Signed)
Stanislaw, PA at bedside closed reduction of Right wrist fracture completed at bedside, pt tolerated procedure well

## 2013-05-26 NOTE — ED Notes (Signed)
Dr. Cooley at bedside.

## 2013-05-26 NOTE — ED Notes (Signed)
Izora Ribas, MD at bedside

## 2013-05-26 NOTE — ED Notes (Signed)
PA at bedside.

## 2013-05-27 ENCOUNTER — Telehealth (HOSPITAL_COMMUNITY): Payer: Self-pay | Admitting: Emergency Medicine

## 2013-05-27 NOTE — ED Notes (Signed)
Rx for Zofran changed to Phenergan 25 mg. Sig: 1 tab PO q 4-6 hrs PRN. Disp #10. Per Joni Reining Pisciotta PA-C.

## 2013-05-27 NOTE — ED Provider Notes (Signed)
See prior note   Ward Givens, MD 05/27/13 [id number]

## 2013-05-31 ENCOUNTER — Inpatient Hospital Stay (HOSPITAL_COMMUNITY): Payer: Medicare Other

## 2013-05-31 ENCOUNTER — Encounter (HOSPITAL_COMMUNITY): Payer: Self-pay | Admitting: *Deleted

## 2013-05-31 ENCOUNTER — Inpatient Hospital Stay (HOSPITAL_COMMUNITY)
Admission: EM | Admit: 2013-05-31 | Discharge: 2013-06-09 | DRG: 603 | Disposition: A | Discharge status: Home Health | Payer: Medicare Other | Attending: Cardiovascular Disease | Admitting: Cardiovascular Disease

## 2013-05-31 DIAGNOSIS — I4891 Unspecified atrial fibrillation: Secondary | ICD-10-CM | POA: Diagnosis present

## 2013-05-31 DIAGNOSIS — Z96659 Presence of unspecified artificial knee joint: Secondary | ICD-10-CM

## 2013-05-31 DIAGNOSIS — E119 Type 2 diabetes mellitus without complications: Secondary | ICD-10-CM | POA: Diagnosis present

## 2013-05-31 DIAGNOSIS — N3941 Urge incontinence: Secondary | ICD-10-CM | POA: Diagnosis present

## 2013-05-31 DIAGNOSIS — L0390 Cellulitis, unspecified: Secondary | ICD-10-CM

## 2013-05-31 DIAGNOSIS — I509 Heart failure, unspecified: Secondary | ICD-10-CM | POA: Diagnosis present

## 2013-05-31 DIAGNOSIS — M949 Disorder of cartilage, unspecified: Secondary | ICD-10-CM | POA: Diagnosis present

## 2013-05-31 DIAGNOSIS — IMO0002 Reserved for concepts with insufficient information to code with codable children: Principal | ICD-10-CM | POA: Diagnosis present

## 2013-05-31 DIAGNOSIS — Z79899 Other long term (current) drug therapy: Secondary | ICD-10-CM

## 2013-05-31 DIAGNOSIS — M129 Arthropathy, unspecified: Secondary | ICD-10-CM | POA: Diagnosis present

## 2013-05-31 DIAGNOSIS — E559 Vitamin D deficiency, unspecified: Secondary | ICD-10-CM | POA: Diagnosis present

## 2013-05-31 DIAGNOSIS — R791 Abnormal coagulation profile: Secondary | ICD-10-CM | POA: Diagnosis not present

## 2013-05-31 DIAGNOSIS — Z853 Personal history of malignant neoplasm of breast: Secondary | ICD-10-CM

## 2013-05-31 DIAGNOSIS — L03119 Cellulitis of unspecified part of limb: Secondary | ICD-10-CM | POA: Diagnosis present

## 2013-05-31 DIAGNOSIS — Z6841 Body Mass Index (BMI) 40.0 and over, adult: Secondary | ICD-10-CM

## 2013-05-31 DIAGNOSIS — Z7901 Long term (current) use of anticoagulants: Secondary | ICD-10-CM

## 2013-05-31 DIAGNOSIS — M899 Disorder of bone, unspecified: Secondary | ICD-10-CM | POA: Diagnosis present

## 2013-05-31 DIAGNOSIS — K219 Gastro-esophageal reflux disease without esophagitis: Secondary | ICD-10-CM | POA: Diagnosis present

## 2013-05-31 DIAGNOSIS — J45909 Unspecified asthma, uncomplicated: Secondary | ICD-10-CM | POA: Diagnosis present

## 2013-05-31 DIAGNOSIS — L02519 Cutaneous abscess of unspecified hand: Secondary | ICD-10-CM | POA: Diagnosis present

## 2013-05-31 DIAGNOSIS — S5290XD Unspecified fracture of unspecified forearm, subsequent encounter for closed fracture with routine healing: Secondary | ICD-10-CM

## 2013-05-31 DIAGNOSIS — E785 Hyperlipidemia, unspecified: Secondary | ICD-10-CM | POA: Diagnosis present

## 2013-05-31 DIAGNOSIS — I10 Essential (primary) hypertension: Secondary | ICD-10-CM | POA: Diagnosis present

## 2013-05-31 DIAGNOSIS — I2510 Atherosclerotic heart disease of native coronary artery without angina pectoris: Secondary | ICD-10-CM | POA: Diagnosis present

## 2013-05-31 DIAGNOSIS — G4730 Sleep apnea, unspecified: Secondary | ICD-10-CM | POA: Diagnosis present

## 2013-05-31 DIAGNOSIS — X58XXXA Exposure to other specified factors, initial encounter: Secondary | ICD-10-CM | POA: Diagnosis present

## 2013-05-31 DIAGNOSIS — K5900 Constipation, unspecified: Secondary | ICD-10-CM | POA: Diagnosis present

## 2013-05-31 HISTORY — DX: Personal history of other medical treatment: Z92.89

## 2013-05-31 HISTORY — DX: Personal history of other diseases of the digestive system: Z87.19

## 2013-05-31 HISTORY — DX: Dependence on supplemental oxygen: Z99.81

## 2013-05-31 HISTORY — DX: Low back pain, unspecified: M54.50

## 2013-05-31 HISTORY — DX: Unspecified fracture of the lower end of unspecified radius, initial encounter for closed fracture: S52.509A

## 2013-05-31 HISTORY — DX: Other chronic pain: G89.29

## 2013-05-31 HISTORY — DX: Migraine, unspecified, not intractable, without status migrainosus: G43.909

## 2013-05-31 HISTORY — DX: Shortness of breath: R06.02

## 2013-05-31 HISTORY — DX: Low back pain: M54.5

## 2013-05-31 HISTORY — DX: Unspecified chronic bronchitis: J42

## 2013-05-31 HISTORY — DX: Type 2 diabetes mellitus without complications: E11.9

## 2013-05-31 LAB — CBC WITH DIFFERENTIAL/PLATELET
Basophils Absolute: 0 10*3/uL (ref 0.0–0.1)
Basophils Relative: 0 % (ref 0–1)
Eosinophils Absolute: 0.1 10*3/uL (ref 0.0–0.7)
Eosinophils Relative: 2 % (ref 0–5)
HCT: 38.1 % (ref 36.0–46.0)
Hemoglobin: 12.4 g/dL (ref 12.0–15.0)
Lymphocytes Relative: 29 % (ref 12–46)
Lymphs Abs: 2.3 10*3/uL (ref 0.7–4.0)
MCH: 26.4 pg (ref 26.0–34.0)
MCHC: 32.5 g/dL (ref 30.0–36.0)
MCV: 81.1 fL (ref 78.0–100.0)
Monocytes Absolute: 0.5 10*3/uL (ref 0.1–1.0)
Monocytes Relative: 6 % (ref 3–12)
Neutro Abs: 5 10*3/uL (ref 1.7–7.7)
Neutrophils Relative %: 63 % (ref 43–77)
Platelets: 276 10*3/uL (ref 150–400)
RBC: 4.7 MIL/uL (ref 3.87–5.11)
RDW: 15.3 % (ref 11.5–15.5)
WBC: 8 10*3/uL (ref 4.0–10.5)

## 2013-05-31 LAB — BASIC METABOLIC PANEL
BUN: 14 mg/dL (ref 6–23)
CO2: 25 mEq/L (ref 19–32)
Calcium: 9.5 mg/dL (ref 8.4–10.5)
Chloride: 102 mEq/L (ref 96–112)
Creatinine, Ser: 0.78 mg/dL (ref 0.50–1.10)
GFR calc Af Amer: >90 mL/min (ref >90)
GFR calc non Af Amer: 83 mL/min — ABNORMAL LOW (ref >90)
Glucose, Bld: 139 mg/dL — ABNORMAL HIGH (ref 70–99)
Potassium: 4.1 mEq/L (ref 3.5–5.1)
Sodium: 137 mEq/L (ref 135–145)

## 2013-05-31 LAB — URINE MICROSCOPIC-ADD ON

## 2013-05-31 LAB — URINALYSIS, ROUTINE W REFLEX MICROSCOPIC
Bilirubin Urine: NEGATIVE
Glucose, UA: NEGATIVE mg/dL
Ketones, ur: NEGATIVE mg/dL
Nitrite: NEGATIVE
Protein, ur: NEGATIVE mg/dL
Specific Gravity, Urine: 1.021 (ref 1.005–1.030)
Urobilinogen, UA: 1 mg/dL (ref 0.0–1.0)
pH: 5.5 (ref 5.0–8.0)

## 2013-05-31 LAB — GLUCOSE, CAPILLARY: Glucose-Capillary: 120 mg/dL — ABNORMAL HIGH (ref 70–99)

## 2013-05-31 MED ORDER — VITAMIN D3 25 MCG (1000 UNIT) PO TABS
1000.0000 [IU] | ORAL_TABLET | Freq: Every day | ORAL | Status: DC
  Administered 2013-05-31 – 2013-06-09 (×10): 1000 [IU] via ORAL
  Filled 2013-05-31 (×10): qty 1

## 2013-05-31 MED ORDER — PANTOPRAZOLE SODIUM 40 MG PO TBEC
40.0000 mg | DELAYED_RELEASE_TABLET | Freq: Every day | ORAL | Status: DC
  Administered 2013-06-01 – 2013-06-09 (×9): 40 mg via ORAL
  Filled 2013-05-31 (×8): qty 1

## 2013-05-31 MED ORDER — MECLIZINE HCL 25 MG PO TABS
25.0000 mg | ORAL_TABLET | Freq: Four times a day (QID) | ORAL | Status: DC | PRN
  Filled 2013-05-31: qty 1

## 2013-05-31 MED ORDER — CEFTRIAXONE SODIUM 1 G IJ SOLR: 1.0000 g | INTRAMUSCULAR | Status: DC

## 2013-05-31 MED ORDER — ATORVASTATIN CALCIUM 20 MG PO TABS
20.0000 mg | ORAL_TABLET | Freq: Every day | ORAL | Status: DC
  Administered 2013-05-31 – 2013-06-08 (×9): 20 mg via ORAL
  Filled 2013-05-31 (×10): qty 1

## 2013-05-31 MED ORDER — METOPROLOL TARTRATE 25 MG PO TABS
25.0000 mg | ORAL_TABLET | Freq: Two times a day (BID) | ORAL | Status: DC
  Administered 2013-05-31 – 2013-06-09 (×18): 25 mg via ORAL
  Filled 2013-05-31 (×25): qty 1

## 2013-05-31 MED ORDER — VANCOMYCIN HCL IN DEXTROSE 1-5 GM/200ML-% IV SOLN
1000.0000 mg | Freq: Two times a day (BID) | INTRAVENOUS | Status: DC
  Filled 2013-05-31: qty 200

## 2013-05-31 MED ORDER — SIMVASTATIN 40 MG PO TABS: 40.0000 mg | ORAL_TABLET | ORAL | Status: DC

## 2013-05-31 MED ORDER — VANCOMYCIN HCL IN DEXTROSE 1-5 GM/200ML-% IV SOLN
1000.0000 mg | Freq: Once | INTRAVENOUS | Status: AC
  Administered 2013-05-31: 1000 mg via INTRAVENOUS
  Filled 2013-05-31: qty 200

## 2013-05-31 MED ORDER — ASPIRIN EC 81 MG PO TBEC
81.0000 mg | DELAYED_RELEASE_TABLET | Freq: Every day | ORAL | Status: DC
  Administered 2013-05-31 – 2013-06-09 (×10): 81 mg via ORAL
  Filled 2013-05-31 (×10): qty 1

## 2013-05-31 MED ORDER — DEXTROSE 5 % IV SOLN
1.0000 g | INTRAVENOUS | Status: DC
  Administered 2013-05-31 – 2013-06-06 (×7): 1 g via INTRAVENOUS
  Filled 2013-05-31 (×8): qty 10

## 2013-05-31 MED ORDER — IPRATROPIUM-ALBUTEROL 18-103 MCG/ACT IN AERO
2.0000 | INHALATION_SPRAY | Freq: Four times a day (QID) | RESPIRATORY_TRACT | Status: DC | PRN
  Filled 2013-05-31: qty 14.7

## 2013-05-31 MED ORDER — DILTIAZEM HCL ER BEADS 240 MG PO CP24
240.0000 mg | ORAL_CAPSULE | Freq: Every day | ORAL | Status: DC
  Administered 2013-05-31 – 2013-06-09 (×10): 240 mg via ORAL
  Filled 2013-05-31 (×11): qty 1

## 2013-05-31 MED ORDER — DIAZEPAM 5 MG PO TABS
5.0000 mg | ORAL_TABLET | Freq: Every evening | ORAL | Status: DC | PRN
Start: 2013-05-31 — End: 2013-06-09
  Administered 2013-05-31 – 2013-06-07 (×3): 5 mg via ORAL
  Filled 2013-05-31 (×3): qty 1

## 2013-05-31 MED ORDER — LISINOPRIL 2.5 MG PO TABS
2.5000 mg | ORAL_TABLET | Freq: Every day | ORAL | Status: DC
  Administered 2013-05-31 – 2013-06-09 (×10): 2.5 mg via ORAL
  Filled 2013-05-31 (×10): qty 1

## 2013-05-31 MED ORDER — VANCOMYCIN HCL IN DEXTROSE 1-5 GM/200ML-% IV SOLN
1000.0000 mg | INTRAVENOUS | Status: AC
Start: 2013-05-31 — End: 2013-05-31
  Administered 2013-05-31: 1000 mg via INTRAVENOUS
  Filled 2013-05-31: qty 200

## 2013-05-31 MED ORDER — WARFARIN SODIUM 5 MG PO TABS
5.0000 mg | ORAL_TABLET | Freq: Every day | ORAL | Status: DC
  Filled 2013-05-31 (×2): qty 1

## 2013-05-31 MED ORDER — ALBUTEROL SULFATE (5 MG/ML) 0.5% IN NEBU
2.5000 mg | INHALATION_SOLUTION | Freq: Four times a day (QID) | RESPIRATORY_TRACT | Status: DC | PRN
  Administered 2013-05-31 – 2013-06-03 (×9): 2.5 mg via RESPIRATORY_TRACT
  Filled 2013-05-31 (×9): qty 0.5

## 2013-05-31 MED ORDER — HEPARIN SODIUM (PORCINE) 5000 UNIT/ML IJ SOLN: 5000.0000 [IU] | Freq: Three times a day (TID) | INTRAMUSCULAR | Status: DC

## 2013-05-31 MED ORDER — FUROSEMIDE 20 MG PO TABS
20.0000 mg | ORAL_TABLET | Freq: Every day | ORAL | Status: DC
  Administered 2013-06-01 – 2013-06-09 (×9): 20 mg via ORAL
  Filled 2013-05-31 (×9): qty 1

## 2013-05-31 MED ORDER — OXYCODONE-ACETAMINOPHEN 5-325 MG PO TABS
1.0000 | ORAL_TABLET | ORAL | Status: DC | PRN
  Administered 2013-05-31 – 2013-06-09 (×28): 1 via ORAL
  Filled 2013-05-31 (×28): qty 1

## 2013-05-31 MED ORDER — METFORMIN HCL 500 MG PO TABS
500.0000 mg | ORAL_TABLET | Freq: Two times a day (BID) | ORAL | Status: DC
  Administered 2013-05-31 – 2013-06-09 (×18): 500 mg via ORAL
  Filled 2013-05-31 (×24): qty 1

## 2013-05-31 MED ORDER — VANCOMYCIN HCL IN DEXTROSE 1-5 GM/200ML-% IV SOLN
1000.0000 mg | Freq: Two times a day (BID) | INTRAVENOUS | Status: DC
  Administered 2013-06-01 – 2013-06-07 (×13): 1000 mg via INTRAVENOUS
  Filled 2013-05-31 (×14): qty 200

## 2013-05-31 MED ORDER — PROMETHAZINE HCL 25 MG PO TABS
25.0000 mg | ORAL_TABLET | Freq: Four times a day (QID) | ORAL | Status: DC | PRN
  Administered 2013-06-02: 12.5 mg via ORAL
  Administered 2013-06-04 – 2013-06-09 (×4): 25 mg via ORAL
  Filled 2013-05-31 (×6): qty 1

## 2013-05-31 MED ORDER — DOCUSATE SODIUM 100 MG PO CAPS
100.0000 mg | ORAL_CAPSULE | Freq: Two times a day (BID) | ORAL | Status: DC
  Administered 2013-05-31 – 2013-06-09 (×19): 100 mg via ORAL
  Filled 2013-05-31 (×22): qty 1

## 2013-05-31 MED ORDER — WARFARIN - PHYSICIAN DOSING INPATIENT: Freq: Every day | Status: DC

## 2013-05-31 MED ORDER — POTASSIUM CHLORIDE CRYS ER 20 MEQ PO TBCR
20.0000 meq | EXTENDED_RELEASE_TABLET | Freq: Every day | ORAL | Status: DC
  Administered 2013-06-01 – 2013-06-09 (×9): 20 meq via ORAL
  Filled 2013-05-31 (×11): qty 1

## 2013-05-31 NOTE — H&P (Signed)
Lisa Walters is an 72 y.o. female.   Chief Complaint: Right wrist and hand swelling. HPI: 72 years old female with right hand and forearm swelling post fracture on 05/26/2013, treated by Dr. Knute Neu.  Past Medical History  Diagnosis Date  . Osteopenia   . Vitamin D deficiency disease   . Breast cancer     left breast  . Irregular heart beat   . Breast cancer 2007  . Urge incontinence   . Asthma   . Hypertension   . Anemia   . Hyperlipidemia   . GERD (gastroesophageal reflux disease)   . Vertigo, benign positional   . Sleep apnea   . CHF (congestive heart failure)   . Shortness of breath   . Pneumonia     HX OF pna  . Diabetes mellitus     type 2  . Headache(784.0)     hx of migraines  . Arthritis       Past Surgical History  Procedure Laterality Date  . Knee surgery      3 knee replacement  . Breast lumpectomy    . Tubal ligation      Family History  Problem Relation Age of Onset  . Cancer Sister     brain tumor  . Stroke Father   . Kidney disease Mother   . Emphysema Brother    Social History:  reports that she has never smoked. She has never used smokeless tobacco. She reports that she does not drink alcohol or use illicit drugs.  Allergies:  Allergies  Allergen Reactions  . Codeine     Unknown  . Sulfa Antibiotics     Unknown     (Not in a hospital admission)  Results for orders placed during the hospital encounter of 05/31/13 (from the past 48 hour(s))  URINALYSIS, ROUTINE W REFLEX MICROSCOPIC     Status: Abnormal   Collection Time    05/31/13  9:25 AM      Result Value Range   Color, Urine YELLOW  YELLOW   APPearance CLOUDY (*) CLEAR   Specific Gravity, Urine 1.021  1.005 - 1.030   pH 5.5  5.0 - 8.0   Glucose, UA NEGATIVE  NEGATIVE mg/dL   Hgb urine dipstick TRACE (*) NEGATIVE   Bilirubin Urine NEGATIVE  NEGATIVE   Ketones, ur NEGATIVE  NEGATIVE mg/dL   Protein, ur NEGATIVE  NEGATIVE mg/dL   Urobilinogen, UA 1.0  0.0 - 1.0 mg/dL    Nitrite NEGATIVE  NEGATIVE   Leukocytes, UA MODERATE (*) NEGATIVE  URINE MICROSCOPIC-ADD ON     Status: Abnormal   Collection Time    05/31/13  9:25 AM      Result Value Range   Squamous Epithelial / LPF FEW (*) RARE   WBC, UA 21-50  <3 WBC/hpf   RBC / HPF 7-10  <3 RBC/hpf   Bacteria, UA RARE  RARE  CBC WITH DIFFERENTIAL     Status: None   Collection Time    05/31/13  9:30 AM      Result Value Range   WBC 8.0  4.0 - 10.5 K/uL   RBC 4.70  3.87 - 5.11 MIL/uL   Hemoglobin 12.4  12.0 - 15.0 g/dL   HCT 40.9  81.1 - 91.4 %   MCV 81.1  78.0 - 100.0 fL   MCH 26.4  26.0 - 34.0 pg   MCHC 32.5  30.0 - 36.0 g/dL   RDW 78.2  95.6 - 21.3 %  Platelets 276  150 - 400 K/uL   Neutrophils Relative % 63  43 - 77 %   Neutro Abs 5.0  1.7 - 7.7 K/uL   Lymphocytes Relative 29  12 - 46 %   Lymphs Abs 2.3  0.7 - 4.0 K/uL   Monocytes Relative 6  3 - 12 %   Monocytes Absolute 0.5  0.1 - 1.0 K/uL   Eosinophils Relative 2  0 - 5 %   Eosinophils Absolute 0.1  0.0 - 0.7 K/uL   Basophils Relative 0  0 - 1 %   Basophils Absolute 0.0  0.0 - 0.1 K/uL  BASIC METABOLIC PANEL     Status: Abnormal   Collection Time    05/31/13  9:30 AM      Result Value Range   Sodium 137  135 - 145 mEq/L   Potassium 4.1  3.5 - 5.1 mEq/L   Chloride 102  96 - 112 mEq/L   CO2 25  19 - 32 mEq/L   Glucose, Bld 139 (*) 70 - 99 mg/dL   BUN 14  6 - 23 mg/dL   Creatinine, Ser 1.61  0.50 - 1.10 mg/dL   Calcium 9.5  8.4 - 09.6 mg/dL   GFR calc non Af Amer 83 (*) >90 mL/min   GFR calc Af Amer >90  >90 mL/min   Comment:            The eGFR has been calculated     using the CKD EPI equation.     This calculation has not been     validated in all clinical     situations.     eGFR's persistently     <90 mL/min signify     possible Chronic Kidney Disease.   No results found.  @ROS @ + wears reading glasses, + cataract surgery. - hearing loss, + asthma and pneumonia, + palpitation, dizziness hearing lossand chest pain.+  constipation and blood transfusion, + Joint pains. No hearing loss, tinnitus, dentures, GI bleed, kidney stone, CVA, Seizures, Psych admission.  Blood pressure 142/78, pulse 110, temperature 98.2 F (36.8 C), temperature source Oral, resp. rate 20, height 5\' 2"  (1.575 m), weight 101.152 kg (223 lb), SpO2 96.00%. HEENT: [place]/AT, Eyes-Brown, PERL, EOMI, Conjunctiva-Pink, Sclera-Non-icteric  Neck: No JVD, No bruit, Trachea midline.  Lungs: Clear, Bilateral.  Cardiac: Regular rhythm, normal S1 and S2, no S3.  Abdomen: Soft, non-tender.  Extremities: Right hand, wrist and forearm swelling. No cyanosis. No clubbing.  CNS: AxOx3, Cranial nerves grossly intact, moves all 4 extremities. Right handed.  Skin: Warm and dry   Assessment/Plan Right hand and forearm wound infection with swelling r/o osteomyelitis CAD  Atrial Fibrillation  DM, II  Hyperlipidemia  Obesity  Admit IV antibiotic/Home medications.  Jerardo Costabile S 05/31/2013, 1:42 PM

## 2013-05-31 NOTE — ED Notes (Signed)
Patient transported to X-ray 

## 2013-05-31 NOTE — Progress Notes (Signed)
S: pt with increased swelling of L wrist, represented to ER, denies fever, states BS has been running "normal"; concern for wrist infection  O:Blood pressure 145/91, pulse 108, temperature 98.2 F (36.8 C), temperature source Oral, resp. rate 20, height 5\' 2"  (1.575 m), weight 101.152 kg (223 lb), SpO2 96.00%. Results for orders placed in visit on 05/27/12  URINE CULTURE     Status: None   Collection Time    05/27/12  4:56 PM      Result Value Range Status   Colony Count NO GROWTH   Final   Organism ID, Bacteria NO GROWTH   Final   L wrist: swollen warm to touch, with some erythema;  1cm open wound dorsal ulnar side, draining some yellowish serous type drainage, no pus, wrist tender to palpation  A: 1.  L G I open distal radius fracture with cellulitis  2.  Multiple medical problems   P: agree with IV abx, admission, may need I&D, would hold coumadin, ?heparin for ? OR, cont L hand, wrist elevation.

## 2013-05-31 NOTE — Progress Notes (Signed)
Orthopedic Tech Progress Note Patient Details:  Lisa Walters 1941-06-23 [id number] Note that patient has infection on Right wrist stemming from open wound that occurred during initial fall and first visit.  Patient ID: Lisa Walters, female   DOB: Jun 28, 1941, 72 y.o.   MRN: [id number]   [name] 05/31/2013, 11:02 AM

## 2013-05-31 NOTE — ED Provider Notes (Signed)
History     CSN: [id number]  Arrival date & time 05/31/13  4098   First MD Initiated Contact with Patient 05/31/13 [phone]      Chief Complaint  Patient presents with  . Wrist Pain    (Consider location/radiation/quality/duration/timing/severity/associated sxs/prior treatment) HPI Patient presents to the emergency department with pain and swelling in her right hand.  Patient, states, that she broke last Monday.  Patient, states, that 2 days she started noticing swelling and pain in her hand and fingers.  Patient denies any further injury.  Patient denies numbness, weakness, fever, shortness of breath, or rash.  Patient, states, that she did not take anything for her symptoms.  Patient, states, that nothing seemed to make her condition, better or worse. Past Medical History  Diagnosis Date  . Osteopenia   . Vitamin D deficiency disease   . Breast cancer     left breast  . Irregular heart beat   . Breast cancer 2007  . Urge incontinence   . Asthma   . Hypertension   . Anemia   . Hyperlipidemia   . GERD (gastroesophageal reflux disease)   . Vertigo, benign positional   . Sleep apnea   . CHF (congestive heart failure)   . Shortness of breath   . Pneumonia     HX OF pna  . Diabetes mellitus     type 2  . Headache(784.0)     hx of migraines  . Arthritis     Past Surgical History  Procedure Laterality Date  . Knee surgery      3 knee replacement  . Breast lumpectomy    . Tubal ligation      Family History  Problem Relation Age of Onset  . Cancer Sister     brain tumor  . Stroke Father   . Kidney disease Mother   . Emphysema Brother     History  Substance Use Topics  . Smoking status: Never Smoker   . Smokeless tobacco: Never Used  . Alcohol Use: No    OB History   Grav Para Term Preterm Abortions TAB SAB Ect Mult Living   4 4 4       4       Review of Systems All other systems negative except as documented in the HPI. All pertinent positives and  negatives as reviewed in the HPI. Allergies  Codeine and Sulfa antibiotics  Home Medications   Current Outpatient Rx  Name  Route  Sig  Dispense  Refill  . albuterol (PROVENTIL) (5 MG/ML) 0.5% nebulizer solution   Nebulization   Take 2.5 mg by nebulization every 6 (six) hours as needed for wheezing or shortness of breath.          Marland Kitchen albuterol-ipratropium (COMBIVENT) 18-103 MCG/ACT inhaler   Inhalation   Inhale 2 puffs into the lungs every 6 (six) hours as needed for wheezing or shortness of breath.          . cephALEXin (KEFLEX) 500 MG capsule   Oral   Take 500 mg by mouth 4 (four) times daily. Started on Tuesday (05/27/13) with a quantity of 20 capsules         . cholecalciferol (VITAMIN D) 1000 UNITS tablet   Oral   Take 1,000 Units by mouth daily.         . diazepam (VALIUM) 5 MG tablet   Oral   Take 5 mg by mouth at bedtime as needed for anxiety (for muscle relaxation).         Marland Kitchen  diltiazem (TIAZAC) 240 MG 24 hr capsule   Oral   Take 240 mg by mouth daily.         . furosemide (LASIX) 20 MG tablet   Oral   Take 20 mg by mouth daily.          Marland Kitchen lisinopril (PRINIVIL,ZESTRIL) 2.5 MG tablet   Oral   Take 2.5 mg by mouth daily.         . meclizine (ANTIVERT) 25 MG tablet   Oral   Take 25 mg by mouth every 6 (six) hours as needed for dizziness (for vertigo).         . metFORMIN (GLUCOPHAGE) 500 MG tablet   Oral   Take 500 mg by mouth 2 (two) times daily with a meal.         . metoprolol tartrate (LOPRESSOR) 25 MG tablet   Oral   Take 25 mg by mouth 2 (two) times daily.         . montelukast (SINGULAIR) 10 MG tablet   Oral   Take 10 mg by mouth every morning.          Marland Kitchen omeprazole (PRILOSEC) 20 MG capsule   Oral   Take 20 mg by mouth daily.         . ondansetron (ZOFRAN) 4 MG tablet   Oral   Take 4 mg by mouth every 8 (eight) hours as needed for nausea (take if pain medication causes nausea).         Marland Kitchen oxyCODONE-acetaminophen  (PERCOCET/ROXICET) 5-325 MG per tablet   Oral   Take 1 tablet by mouth every 4 (four) hours as needed for pain (for pain).         . potassium chloride SA (K-DUR,KLOR-CON) 20 MEQ tablet   Oral   Take 20 mEq by mouth daily.         . promethazine (PHENERGAN) 25 MG tablet   Oral   Take 25 mg by mouth every 6 (six) hours as needed for nausea (for nausea and vomiting).         . simvastatin (ZOCOR) 40 MG tablet   Oral   Take 40 mg by mouth every other day.          . warfarin (COUMADIN) 5 MG tablet   Oral   Take 5 mg by mouth every evening.          Marland Kitchen ipratropium-albuterol (DUONEB) 0.5-2.5 (3) MG/3ML SOLN   Nebulization   Take 3 mLs by nebulization every 6 (six) hours as needed (wheezing/shortness of breath).           BP 142/78  Pulse 110  Temp(Src) 98.2 F (36.8 C) (Oral)  Resp 20  Ht 5\' 2"  (1.575 m)  Wt 223 lb (101.152 kg)  BMI 40.78 kg/m2  SpO2 96%  Physical Exam  Nursing note and vitals reviewed. Constitutional: She is oriented to person, place, and time. She appears well-developed and well-nourished. No distress.  HENT:  Head: Normocephalic and atraumatic.  Mouth/Throat: Oropharynx is clear and moist.  Eyes: Pupils are equal, round, and reactive to light.  Cardiovascular: Normal rate, regular rhythm and normal heart sounds.  Exam reveals no gallop and no friction rub.   No murmur heard. Pulmonary/Chest: Effort normal and breath sounds normal.  Musculoskeletal:  Upon removal of patient's splint the patient does have redness and swelling to her hands and fingers and there is a half centimeter laceration to the ulnar aspect of her wrist that  is draining purulent material.  Patient has full range of motion of her fingers and good cap refill  Neurological: She is alert and oriented to person, place, and time. She exhibits normal muscle tone. Coordination normal.  Skin: Skin is warm and dry. No rash noted.    ED Course  Procedures (including critical care  time)  Labs Reviewed  BASIC METABOLIC PANEL - Abnormal; Notable for the following:    Glucose, Bld 139 (*)    GFR calc non Af Amer 83 (*)    All other components within normal limits  URINALYSIS, ROUTINE W REFLEX MICROSCOPIC - Abnormal; Notable for the following:    APPearance CLOUDY (*)    Hgb urine dipstick TRACE (*)    Leukocytes, UA MODERATE (*)    All other components within normal limits  URINE MICROSCOPIC-ADD ON - Abnormal; Notable for the following:    Squamous Epithelial / LPF FEW (*)    All other components within normal limits  WOUND CULTURE  CBC WITH DIFFERENTIAL   No results found.   1. Cellulitis    patient will be admitted to the hospital for cellulitis and wound infection.  I spoke with Dr. Izora Ribas, who initially saw the patient for her injury in the emergency department.  I also spoke with her primary care Dr. Robynn Pane.  He'll admit her to the hospital.  Patient was started on vancomycin and has been stable here in the emergency department.    MDM          Carlyle Dolly, PA-C 06/01/13 [phone]

## 2013-05-31 NOTE — Consult Note (Signed)
ANTIBIOTIC CONSULT NOTE - INITIAL  Pharmacy Consult for Vancomycin Indication: wrist infection, r/o osteomyelitis  Allergies  Allergen Reactions  . Codeine     Unknown  . Sulfa Antibiotics     Unknown    Patient Measurements: Height: 5\' 2"  (157.5 cm) Weight: 223 lb (101.152 kg) IBW/kg (Calculated) : 50.1  Vital Signs: Temp: 98.2 F (36.8 C) (06/07 0801) Temp src: Oral (06/07 0801) BP: 142/78 mmHg (06/07 1200) Pulse Rate: 110 (06/07 1200) Intake/Output from previous day:   Intake/Output from this shift:    Labs:  Recent Labs  05/31/13 0930  WBC 8.0  HGB 12.4  PLT 276  CREATININE 0.78   Estimated Creatinine Clearance: 73.9 ml/min (by C-G formula based on Cr of 0.78).   Microbiology: No results found for this or any previous visit (from the past 720 hour(s)).  Medical History: Past Medical History  Diagnosis Date  . Osteopenia   . Vitamin D deficiency disease   . Breast cancer     left breast  . Irregular heart beat   . Breast cancer 2007  . Urge incontinence   . Asthma   . Hypertension   . Anemia   . Hyperlipidemia   . GERD (gastroesophageal reflux disease)   . Vertigo, benign positional   . Sleep apnea   . CHF (congestive heart failure)   . Shortness of breath   . Pneumonia     HX OF pna  . Diabetes mellitus     type 2  . Headache(784.0)     hx of migraines  . Arthritis    Assessment: 71yof s/p closed reduction of a right wrist fracture 05/26/2013 returns to the ED with increased swelling, itchiness, and pain. She will begin empiric vancomycin for cellulitis of the wrist and rule out ostemyelitis. She received 1000mg  of vancomycin in the ED at 0930. Renal function is stable.  Given weight of 101kg will use the obesity nomogram to guide dosing.  Goal of Therapy:  Vancomycin trough level 15-20 mcg/ml until osteo ruled out  Plan:  1) Vancomycin 1000mg  IV x 1 now to complete 2000mg  loading dose 2) Then, vancomycin 1000mg  IV q12 3) Follow  renal function, cultures, imaging, trough at steady state  Fredrik Rigger 05/31/2013,2:09 PM

## 2013-05-31 NOTE — ED Notes (Signed)
MD, Dr. Izora Ribas at bedside placing splint to right wrist

## 2013-05-31 NOTE — ED Notes (Signed)
Admitting MD at bedside.

## 2013-05-31 NOTE — Progress Notes (Signed)
Orthopedic Tech Progress Note Patient Details:  Lisa Walters 1941/03/16 [id number] Dorsal volar splint applied to Right UE. Tolerated well.  Ortho Devices Type of Ortho Device: Volar splint Ortho Device/Splint Location: modified volar-dorsal to Right Ortho Device/Splint Interventions: Application   Asia R Thompson 05/31/2013, 11:01 AM

## 2013-05-31 NOTE — ED Notes (Signed)
MD at bedside. 

## 2013-05-31 NOTE — ED Notes (Signed)
Seen in ED Monday, reports sprained right wrist & has a splint in place. C/o increased swelling to fingers, itchiness underneath splint & pain. Has f/u appt Monday.

## 2013-06-01 LAB — BASIC METABOLIC PANEL
CO2: 25 mEq/L (ref 19–32)
Chloride: 101 mEq/L (ref 96–112)
Glucose, Bld: 139 mg/dL — ABNORMAL HIGH (ref 70–99)
Potassium: 3.8 mEq/L (ref 3.5–5.1)
Sodium: 134 mEq/L — ABNORMAL LOW (ref 135–145)

## 2013-06-01 LAB — GLUCOSE, CAPILLARY
Glucose-Capillary: 107 mg/dL — ABNORMAL HIGH (ref 70–99)
Glucose-Capillary: 134 mg/dL — ABNORMAL HIGH (ref 70–99)
Glucose-Capillary: 135 mg/dL — ABNORMAL HIGH (ref 70–99)

## 2013-06-01 LAB — CBC
Hemoglobin: 11.1 g/dL — ABNORMAL LOW (ref 12.0–15.0)
MCH: 26.2 pg (ref 26.0–34.0)
MCV: 81.3 fL (ref 78.0–100.0)
RBC: 4.23 MIL/uL (ref 3.87–5.11)
WBC: 6.4 10*3/uL (ref 4.0–10.5)

## 2013-06-01 LAB — PROTIME-INR: Prothrombin Time: 34.7 seconds — ABNORMAL HIGH (ref 11.6–15.2)

## 2013-06-01 MED ORDER — IPRATROPIUM-ALBUTEROL 20-100 MCG/ACT IN AERS
2.0000 | INHALATION_SPRAY | Freq: Four times a day (QID) | RESPIRATORY_TRACT | Status: DC | PRN
  Filled 2013-06-01 (×2): qty 4

## 2013-06-01 NOTE — ED Provider Notes (Signed)
Medical screening examination/treatment/procedure(s) were conducted as a shared visit with non-physician practitioner(s) and myself.  I personally evaluated the patient during the encounter   Loren Racer, MD 06/01/13 1456

## 2013-06-01 NOTE — Progress Notes (Signed)
Patient reorts that her chest pain has resolved since she received a breathing treatment.  EKG showed afib,but pt has history of afib.

## 2013-06-01 NOTE — Progress Notes (Signed)
Pt complaining of chest pain,rates it a "7";points to mid chest and says it "hurts".  BP 128/80. Pulse 88. O2 sat 99%ra,temp-97.8.  Pt not diaphoretic. Pain does not radiate.  Dr. Algie Coffer notified. EKG ordered.

## 2013-06-01 NOTE — Progress Notes (Signed)
Subjective:  Right hand swelling and pain continues. Afebrile.  Objective:  Vital Signs in the last 24 hours: Temp:  [97.5 F (36.4 C)-98.2 F (36.8 C)] 97.5 F (36.4 C) (06/08 0620) Pulse Rate:  [88-121] 93 (06/08 0620) Cardiac Rhythm:  [-]  Resp:  [18-20] 20 (06/08 0620) BP: (120-157)/(77-102) 120/77 mmHg (06/08 0620) SpO2:  [95 %-99 %] 95 % (06/08 0620)  Physical Exam: BP Readings from Last 1 Encounters:  06/01/13 120/77     Wt Readings from Last 1 Encounters:  05/31/13 101.152 kg (223 lb)    Weight change:   HEENT: [place]/AT, Eyes-Brown, PERL, EOMI, Conjunctiva-Pink, Sclera-Non-icteric Neck: No JVD, No bruit, Trachea midline. Lungs:  Clear, Bilateral. Cardiac:  Regular rhythm, normal S1 and S2, no S3.  Abdomen:  Soft, non-tender. Extremities:  Right hand, wrist and forearm edema present. No cyanosis. No clubbing. CNS: AxOx3, Cranial nerves grossly intact, moves all 4 extremities. Right handed. Skin: Warm and dry.   Intake/Output from previous day:      Lab Results: BMET    Component Value Date/Time   NA 134* 06/01/2013 0525   K 3.8 06/01/2013 0525   CL 101 06/01/2013 0525   CO2 25 06/01/2013 0525   GLUCOSE 139* 06/01/2013 0525   BUN 15 06/01/2013 0525   CREATININE 0.81 06/01/2013 0525   CALCIUM 9.1 06/01/2013 0525   GFRNONAA 72* 06/01/2013 0525   GFRAA 84* 06/01/2013 0525   CBC    Component Value Date/Time   WBC 6.4 06/01/2013 0525   WBC 5.4 01/18/2011 1339   RBC 4.23 06/01/2013 0525   RBC 4.63 01/18/2011 1339   HGB 11.1* 06/01/2013 0525   HGB 12.6 01/18/2011 1339   HCT 34.4* 06/01/2013 0525   HCT 38.9 01/18/2011 1339   PLT 270 06/01/2013 0525   PLT 240 01/18/2011 1339   MCV 81.3 06/01/2013 0525   MCV 84.1 01/18/2011 1339   MCH 26.2 06/01/2013 0525   MCH 26.7 07/21/2010 1500   MCHC 32.3 06/01/2013 0525   MCHC 32.5 01/18/2011 1339   RDW 15.2 06/01/2013 0525   RDW 15.4* 01/18/2011 1339   LYMPHSABS 2.3 05/31/2013 0930   LYMPHSABS 1.6 01/18/2011 1339   MONOABS 0.5 05/31/2013 0930   MONOABS 0.4  01/18/2011 1339   EOSABS 0.1 05/31/2013 0930   EOSABS 0.2 01/18/2011 1339   BASOSABS 0.0 05/31/2013 0930   BASOSABS 0.0 01/18/2011 1339   CARDIAC ENZYMES Lab Results  Component Value Date   CKTOTAL 47 08/02/2012   CKMB 1.3 08/02/2012   TROPONINI <0.30 08/02/2012    Scheduled Meds: . aspirin EC  81 mg Oral Daily  . atorvastatin  20 mg Oral q1800  . cefTRIAXone (ROCEPHIN)  IV  1 g Intravenous Q24H  . cholecalciferol  1,000 Units Oral Daily  . diltiazem  240 mg Oral Daily  . docusate sodium  100 mg Oral BID  . furosemide  20 mg Oral Daily  . lisinopril  2.5 mg Oral Daily  . metFORMIN  500 mg Oral BID WC  . metoprolol tartrate  25 mg Oral BID  . pantoprazole  40 mg Oral Daily  . potassium chloride SA  20 mEq Oral Daily  . vancomycin  1,000 mg Intravenous Q12H  . warfarin  5 mg Oral q1800  . Warfarin - Physician Dosing Inpatient   Does not apply q1800   Continuous Infusions:  PRN Meds:.albuterol, diazepam, Ipratropium-Albuterol, meclizine, oxyCODONE-acetaminophen, promethazine  Assessment/Plan: Right hand and forearm cellulitis CAD  Atrial Fibrillation  DM, II  Hyperlipidemia  Obesity  Continue medications and activity.   LOS: 1 day    Orpah Cobb  MD  06/01/2013, 11:34 AM

## 2013-06-02 ENCOUNTER — Encounter (HOSPITAL_COMMUNITY): Payer: Self-pay | Admitting: General Practice

## 2013-06-02 LAB — GLUCOSE, CAPILLARY: Glucose-Capillary: 174 mg/dL — ABNORMAL HIGH (ref 70–99)

## 2013-06-02 LAB — WOUND CULTURE
Culture: NO GROWTH
Gram Stain: NONE SEEN
Special Requests: NORMAL

## 2013-06-02 MED ORDER — BACITRACIN-NEOMYCIN-POLYMYXIN 400-5-5000 EX OINT
TOPICAL_OINTMENT | CUTANEOUS | Status: AC
  Administered 2013-06-02: 19:00:00
  Filled 2013-06-02: qty 2

## 2013-06-02 NOTE — Progress Notes (Signed)
S:  Hand still hurts, difficult for her to keep elevated, b/c IV is in that side  O:Blood pressure 115/75, pulse 84, temperature 98 F (36.7 C), temperature source Oral, resp. rate 18, height 5\' 2"  (1.575 m), weight 102.3 kg (225 lb 8.5 oz), SpO2 94.00%. Results for orders placed during the hospital encounter of 05/31/13  WOUND CULTURE     Status: None   Collection Time    05/31/13 10:39 AM      Result Value Range Status   Specimen Description ARM   Final   Special Requests Normal   Final   Gram Stain     Final   Value: NO WBC SEEN     RARE SQUAMOUS EPITHELIAL CELLS PRESENT     NO ORGANISMS SEEN   Culture NO GROWTH 2 DAYS   Final   Report Status 06/02/2013 FINAL   Final   L hand - significant swelling of fingers, erythema, warmth improved on abx, no purulent drainage from wound  A: L distal radius, ulna fx - GI open with cellulitis   P: wound care of small wound, no immediate need for I&D, change IV, elevate, move fingers, cont splint, NWB; would cont IV abx for now and transition to oral in 24-48 hours if wound/cellulitis improve

## 2013-06-02 NOTE — Progress Notes (Signed)
Nutrition Brief Note  Patient identified on the Malnutrition Screening Tool (MST) Report  Body mass index is 41.24 kg/(m^2). Patient meets criteria for morbid obesity based on current BMI.   Current diet order is Heart Healthy, patient is consuming approximately 50% of meals at this time. Labs and medications reviewed.   Pt denies recent wt change and reports good appetite and intake PTA.  Pt denies nutrition-related concerns. No nutrition interventions warranted at this time. If nutrition issues arise, please consult RD.   Loyce Dys, MS RD LDN Clinical Inpatient Dietitian Pager: [phone] Weekend/After hours pager: [phone]

## 2013-06-02 NOTE — Progress Notes (Signed)
Subjective:  No significant change in hand swelling. Able to wiggle fingers. Remains afebrile.  Objective:  Vital Signs in the last 24 hours: Temp:  [97.6 F (36.4 C)-97.7 F (36.5 C)] 97.6 F (36.4 C) (06/09 0607) Pulse Rate:  [77-91] 77 (06/09 0607) Cardiac Rhythm:  [-]  Resp:  [14-16] 16 (06/09 0607) BP: (99-132)/(65-86) 127/65 mmHg (06/09 0607) SpO2:  [95 %-100 %] 97 % (06/09 0607) Weight:  [102.3 kg (225 lb 8.5 oz)] 102.3 kg (225 lb 8.5 oz) (06/09 1610)  Physical Exam: BP Readings from Last 1 Encounters:  06/02/13 127/65     Wt Readings from Last 1 Encounters:  06/02/13 102.3 kg (225 lb 8.5 oz)    Weight change: 1.148 kg (2 lb 8.5 oz)  HEENT: [place]/AT, Eyes-Brown, PERL, EOMI, Conjunctiva-Pink, Sclera-Non-icteric Neck: No JVD, No bruit, Trachea midline. Lungs:  Clear, Bilateral. Cardiac:  Regular rhythm, normal S1 and S2, no S3.  Abdomen:  Soft, non-tender. Extremities:  Right hand edema as before. No cyanosis. No clubbing. CNS: AxOx3, Cranial nerves grossly intact, moves all 4 extremities. Right handed. Skin: Warm and dry.   Intake/Output from previous day: 06/08 0701 - 06/09 0700 In: 240 [P.O.:240] Out: -     Lab Results: BMET    Component Value Date/Time   NA 134* 06/01/2013 0525   K 3.8 06/01/2013 0525   CL 101 06/01/2013 0525   CO2 25 06/01/2013 0525   GLUCOSE 139* 06/01/2013 0525   BUN 15 06/01/2013 0525   CREATININE 0.81 06/01/2013 0525   CALCIUM 9.1 06/01/2013 0525   GFRNONAA 72* 06/01/2013 0525   GFRAA 84* 06/01/2013 0525   CBC    Component Value Date/Time   WBC 6.4 06/01/2013 0525   WBC 5.4 01/18/2011 1339   RBC 4.23 06/01/2013 0525   RBC 4.63 01/18/2011 1339   HGB 11.1* 06/01/2013 0525   HGB 12.6 01/18/2011 1339   HCT 34.4* 06/01/2013 0525   HCT 38.9 01/18/2011 1339   PLT 270 06/01/2013 0525   PLT 240 01/18/2011 1339   MCV 81.3 06/01/2013 0525   MCV 84.1 01/18/2011 1339   MCH 26.2 06/01/2013 0525   MCH 26.7 07/21/2010 1500   MCHC 32.3 06/01/2013 0525   MCHC 32.5 01/18/2011  1339   RDW 15.2 06/01/2013 0525   RDW 15.4* 01/18/2011 1339   LYMPHSABS 2.3 05/31/2013 0930   LYMPHSABS 1.6 01/18/2011 1339   MONOABS 0.5 05/31/2013 0930   MONOABS 0.4 01/18/2011 1339   EOSABS 0.1 05/31/2013 0930   EOSABS 0.2 01/18/2011 1339   BASOSABS 0.0 05/31/2013 0930   BASOSABS 0.0 01/18/2011 1339   CARDIAC ENZYMES Lab Results  Component Value Date   CKTOTAL 47 08/02/2012   CKMB 1.3 08/02/2012   TROPONINI <0.30 08/02/2012    Scheduled Meds: . aspirin EC  81 mg Oral Daily  . atorvastatin  20 mg Oral q1800  . cefTRIAXone (ROCEPHIN)  IV  1 g Intravenous Q24H  . cholecalciferol  1,000 Units Oral Daily  . diltiazem  240 mg Oral Daily  . docusate sodium  100 mg Oral BID  . furosemide  20 mg Oral Daily  . lisinopril  2.5 mg Oral Daily  . metFORMIN  500 mg Oral BID WC  . metoprolol tartrate  25 mg Oral BID  . pantoprazole  40 mg Oral Daily  . potassium chloride SA  20 mEq Oral Daily  . vancomycin  1,000 mg Intravenous Q12H  . Warfarin - Physician Dosing Inpatient   Does not apply [phone]  Continuous Infusions:  PRN Meds:.albuterol, diazepam, Ipratropium-Albuterol, meclizine, oxyCODONE-acetaminophen, promethazine  Assessment/Plan: Right hand and forearm cellulitis  CAD  Atrial Fibrillation  DM, II  Hyperlipidemia  Obesity  Continue medical treatment   LOS: 2 days    Orpah Cobb  MD  06/02/2013, 9:22 AM

## 2013-06-03 LAB — PROTIME-INR
INR: 2.8 — ABNORMAL HIGH (ref 0.00–1.49)
Prothrombin Time: 28.1 seconds — ABNORMAL HIGH (ref 11.6–15.2)

## 2013-06-03 LAB — GLUCOSE, CAPILLARY: Glucose-Capillary: 141 mg/dL — ABNORMAL HIGH (ref 70–99)

## 2013-06-03 MED ORDER — ALBUTEROL SULFATE (5 MG/ML) 0.5% IN NEBU
2.5000 mg | INHALATION_SOLUTION | Freq: Three times a day (TID) | RESPIRATORY_TRACT | Status: DC
Start: 2013-06-03 — End: 2013-06-09
  Administered 2013-06-03 – 2013-06-09 (×18): 2.5 mg via RESPIRATORY_TRACT
  Filled 2013-06-03 (×20): qty 0.5

## 2013-06-03 MED ORDER — WARFARIN SODIUM 5 MG PO TABS
5.0000 mg | ORAL_TABLET | Freq: Every day | ORAL | Status: DC
  Administered 2013-06-03: 5 mg via ORAL
  Filled 2013-06-03 (×2): qty 1

## 2013-06-03 MED ORDER — BACITRACIN-NEOMYCIN-POLYMYXIN 400-5-5000 EX OINT
TOPICAL_OINTMENT | CUTANEOUS | Status: AC
  Administered 2013-06-03: 1
  Filled 2013-06-03: qty 1

## 2013-06-03 MED ORDER — ALBUTEROL SULFATE (5 MG/ML) 0.5% IN NEBU
2.5000 mg | INHALATION_SOLUTION | Freq: Four times a day (QID) | RESPIRATORY_TRACT | Status: DC | PRN
  Administered 2013-06-03 – 2013-06-05 (×4): 2.5 mg via RESPIRATORY_TRACT
  Filled 2013-06-03 (×3): qty 0.5

## 2013-06-03 MED ORDER — BACITRACIN-NEOMYCIN-POLYMYXIN 400-5-5000 EX OINT
TOPICAL_OINTMENT | CUTANEOUS | Status: AC
  Filled 2013-06-03: qty 1

## 2013-06-03 NOTE — Progress Notes (Signed)
Subjective:  Feeling better. New dressing and splint applied. Afebrile.  Objective:  Vital Signs in the last 24 hours: Temp:  [97.3 F (36.3 C)-98.7 F (37.1 C)] 97.3 F (36.3 C) (06/10 1344) Pulse Rate:  [68-80] 71 (06/10 1344) Cardiac Rhythm:  [-]  Resp:  [16-18] 18 (06/10 1344) BP: (116-126)/(65-89) 116/65 mmHg (06/10 1344) SpO2:  [93 %-99 %] 96 % (06/10 1650) Weight:  [101.606 kg (224 lb)] 101.606 kg (224 lb) (06/10 0601)  Physical Exam: BP Readings from Last 1 Encounters:  06/03/13 116/65     Wt Readings from Last 1 Encounters:  06/03/13 101.606 kg (224 lb)    Weight change: -0.694 kg (-1 lb 8.5 oz)  HEENT: [place]/AT, Eyes-Brown, PERL, EOMI, Conjunctiva-Pink, Sclera-Non-icteric Neck: No JVD, No bruit, Trachea midline. Lungs:  Clear, Bilateral. Cardiac:  Regular rhythm, normal S1 and S2, no S3.  Abdomen:  Soft, non-tender. Extremities:  Decreasing right hand edema. No cyanosis. No clubbing. CNS: AxOx3, Cranial nerves grossly intact, moves all 4 extremities. Right handed. Skin: Warm and dry.   Intake/Output from previous day:      Lab Results: BMET    Component Value Date/Time   NA 134* 06/01/2013 0525   K 3.8 06/01/2013 0525   CL 101 06/01/2013 0525   CO2 25 06/01/2013 0525   GLUCOSE 139* 06/01/2013 0525   BUN 15 06/01/2013 0525   CREATININE 0.81 06/01/2013 0525   CALCIUM 9.1 06/01/2013 0525   GFRNONAA 72* 06/01/2013 0525   GFRAA 84* 06/01/2013 0525   CBC    Component Value Date/Time   WBC 6.4 06/01/2013 0525   WBC 5.4 01/18/2011 1339   RBC 4.23 06/01/2013 0525   RBC 4.63 01/18/2011 1339   HGB 11.1* 06/01/2013 0525   HGB 12.6 01/18/2011 1339   HCT 34.4* 06/01/2013 0525   HCT 38.9 01/18/2011 1339   PLT 270 06/01/2013 0525   PLT 240 01/18/2011 1339   MCV 81.3 06/01/2013 0525   MCV 84.1 01/18/2011 1339   MCH 26.2 06/01/2013 0525   MCH 26.7 07/21/2010 1500   MCHC 32.3 06/01/2013 0525   MCHC 32.5 01/18/2011 1339   RDW 15.2 06/01/2013 0525   RDW 15.4* 01/18/2011 1339   LYMPHSABS 2.3 05/31/2013  0930   LYMPHSABS 1.6 01/18/2011 1339   MONOABS 0.5 05/31/2013 0930   MONOABS 0.4 01/18/2011 1339   EOSABS 0.1 05/31/2013 0930   EOSABS 0.2 01/18/2011 1339   BASOSABS 0.0 05/31/2013 0930   BASOSABS 0.0 01/18/2011 1339   CARDIAC ENZYMES Lab Results  Component Value Date   CKTOTAL 47 08/02/2012   CKMB 1.3 08/02/2012   TROPONINI <0.30 08/02/2012    Scheduled Meds: . albuterol  2.5 mg Nebulization TID  . aspirin EC  81 mg Oral Daily  . atorvastatin  20 mg Oral q1800  . cefTRIAXone (ROCEPHIN)  IV  1 g Intravenous Q24H  . cholecalciferol  1,000 Units Oral Daily  . diltiazem  240 mg Oral Daily  . docusate sodium  100 mg Oral BID  . furosemide  20 mg Oral Daily  . lisinopril  2.5 mg Oral Daily  . metFORMIN  500 mg Oral BID WC  . metoprolol tartrate  25 mg Oral BID  . neomycin-bacitracin-polymyxin      . pantoprazole  40 mg Oral Daily  . potassium chloride SA  20 mEq Oral Daily  . vancomycin  1,000 mg Intravenous Q12H  . warfarin  5 mg Oral q1800  . Warfarin - Physician Dosing Inpatient   Does  not apply q1800   Continuous Infusions:  PRN Meds:.albuterol, diazepam, Ipratropium-Albuterol, meclizine, oxyCODONE-acetaminophen, promethazine  Assessment/Plan: Right hand and forearm cellulitis  CAD  Atrial Fibrillation  DM, II  Hyperlipidemia  Obesity  Continue medical treatment.   LOS: 3 days    Orpah Cobb  MD  06/03/2013, 6:55 PM

## 2013-06-04 LAB — CBC
Hemoglobin: 11.5 g/dL — ABNORMAL LOW (ref 12.0–15.0)
MCH: 25.9 pg — ABNORMAL LOW (ref 26.0–34.0)
RBC: 4.44 MIL/uL (ref 3.87–5.11)

## 2013-06-04 LAB — GLUCOSE, CAPILLARY
Glucose-Capillary: 127 mg/dL — ABNORMAL HIGH (ref 70–99)
Glucose-Capillary: 121 mg/dL — ABNORMAL HIGH (ref 70–99)
Glucose-Capillary: 113 mg/dL — ABNORMAL HIGH (ref 70–99)
Glucose-Capillary: 85 mg/dL (ref 70–99)

## 2013-06-04 LAB — BASIC METABOLIC PANEL
CO2: 25 mEq/L (ref 19–32)
Calcium: 9.5 mg/dL (ref 8.4–10.5)
Glucose, Bld: 119 mg/dL — ABNORMAL HIGH (ref 70–99)
Sodium: 135 mEq/L (ref 135–145)

## 2013-06-04 LAB — PROTIME-INR: INR: 2.43 — ABNORMAL HIGH (ref 0.00–1.49)

## 2013-06-04 MED ORDER — WARFARIN SODIUM 7.5 MG PO TABS
7.5000 mg | ORAL_TABLET | Freq: Every day | ORAL | Status: DC
  Administered 2013-06-04: 7.5 mg via ORAL
  Filled 2013-06-04 (×2): qty 1

## 2013-06-04 MED ORDER — BACITRACIN-NEOMYCIN-POLYMYXIN 400-5-5000 EX OINT
TOPICAL_OINTMENT | CUTANEOUS | Status: AC
  Filled 2013-06-04: qty 1

## 2013-06-04 MED ORDER — BACITRACIN-NEOMYCIN-POLYMYXIN 400-5-5000 EX OINT
TOPICAL_OINTMENT | CUTANEOUS | Status: AC
  Administered 2013-06-04: 1
  Filled 2013-06-04: qty 1

## 2013-06-04 NOTE — Progress Notes (Signed)
ANTIBIOTIC CONSULT NOTE - FOLLOW UP  Pharmacy Consult for vancomycin Indication: cellulits of wrist  Allergies  Allergen Reactions  . Codeine     Unknown  . Sulfa Antibiotics     Unknown    Patient Measurements: Height: 5\' 2"  (157.5 cm) Weight: 222 lb 14.2 oz (101.1 kg) IBW/kg (Calculated) : 50.1   Vital Signs: Temp: 97.8 F (36.6 C) (06/11 0537) Temp src: Oral (06/11 0537) BP: 113/67 mmHg (06/11 0537) Pulse Rate: 81 (06/11 0537) Intake/Output from previous day: 06/10 0701 - 06/11 0700 In: 400 [IV Piggyback:400] Out: -  Intake/Output from this shift:    Labs:  Recent Labs  06/04/13 0510  WBC 6.2  HGB 11.5*  PLT 312  CREATININE 0.95   Estimated Creatinine Clearance: 62.2 ml/min (by C-G formula based on Cr of 0.95). No results found for this basename: VANCOTROUGH, VANCOPEAK, VANCORANDOM, GENTTROUGH, GENTPEAK, GENTRANDOM, TOBRATROUGH, TOBRAPEAK, TOBRARND, AMIKACINPEAK, AMIKACINTROU, AMIKACIN,  in the last 72 hours   Microbiology: Recent Results (from the past 720 hour(s))  WOUND CULTURE     Status: None   Collection Time    05/31/13 10:39 AM      Result Value Range Status   Specimen Description ARM   Final   Special Requests Normal   Final   Gram Stain     Final   Value: NO WBC SEEN     RARE SQUAMOUS EPITHELIAL CELLS PRESENT     NO ORGANISMS SEEN   Culture NO GROWTH 2 DAYS   Final   Report Status 06/02/2013 FINAL   Final    Anti-infectives   Start     Dose/Rate Route Frequency Ordered Stop   06/01/13 0600  vancomycin (VANCOCIN) IVPB 1000 mg/200 mL premix     1,000 mg 200 mL/hr over 60 Minutes Intravenous Every 12 hours 05/31/13 1804     06/01/13 0300  vancomycin (VANCOCIN) IVPB 1000 mg/200 mL premix  Status:  Discontinued     1,000 mg 200 mL/hr over 60 Minutes Intravenous Every 12 hours 05/31/13 1415 05/31/13 1804   05/31/13 1600  cefTRIAXone (ROCEPHIN) 1 g in dextrose 5 % 50 mL IVPB     1 g 100 mL/hr over 30 Minutes Intravenous Every 24 hours  05/31/13 1552     05/31/13 1430  vancomycin (VANCOCIN) IVPB 1000 mg/200 mL premix     1,000 mg 200 mL/hr over 60 Minutes Intravenous NOW 05/31/13 1415 05/31/13 1902   05/31/13 1345  cefTRIAXone (ROCEPHIN) injection 1 g  Status:  Discontinued     1 g Intramuscular Every 24 hours 05/31/13 1339 05/31/13 1550   05/31/13 0915  vancomycin (VANCOCIN) IVPB 1000 mg/200 mL premix     1,000 mg 200 mL/hr over 60 Minutes Intravenous  Once 05/31/13 0911 05/31/13 1128      Assessment: 72 yo F s/p closed reduction of R wrist fx 6/2, returned with cellulits.  Currently receiving vancomycin 1g q12h.  SCr stable with crCl~60 ml/min.  Afeb, WBC wnl.  Cx with NGTD.  6/7 vanc >>  6/7 ceftriaxone >>   Goal of Therapy:  Vancomycin trough level 15-20 mcg/ml  Plan:  - Continue vancomycin 1g IV q12 - Follow up SCr, UOP, cultures, clinical course and adjust as clinically indicated - Check vancomycin trough if tx continues >7days  Dashay Giesler L. Illene Bolus, PharmD, BCPS Clinical Pharmacist Pager: [phone] Pharmacy: [phone] 06/04/2013 9:18 AM

## 2013-06-04 NOTE — Progress Notes (Signed)
Orthopedic Tech Progress Note Patient Details:  Lisa Walters 1941/12/15 [id number]  Ortho Devices Type of Ortho Device: Velcro wrist forearm splint Ortho Device/Splint Location: modified volar-dorsal to Right Ortho Device/Splint Interventions: Application   Cammer, Mickie Bail 06/04/2013, 1:30 PM

## 2013-06-04 NOTE — Progress Notes (Signed)
Subjective:  Afebrile. Left hand swelling persist. Able to wiggle fingers  Objective:  Vital Signs in the last 24 hours: Temp:  [97.3 F (36.3 C)-98 F (36.7 C)] 97.8 F (36.6 C) (06/11 0537) Pulse Rate:  [71-81] 81 (06/11 0537) Cardiac Rhythm:  [-]  Resp:  [18] 18 (06/11 0537) BP: (113-140)/(65-93) 113/67 mmHg (06/11 0537) SpO2:  [93 %-97 %] 96 % (06/11 0537) Weight:  [101.1 kg (222 lb 14.2 oz)] 101.1 kg (222 lb 14.2 oz) (06/11 0537)  Physical Exam: BP Readings from Last 1 Encounters:  06/04/13 113/67     Wt Readings from Last 1 Encounters:  06/04/13 101.1 kg (222 lb 14.2 oz)    Weight change: -0.506 kg (-1 lb 1.8 oz)  HEENT: [place]/AT, Eyes-Brown, PERL, EOMI, Conjunctiva-Pink, Sclera-Non-icteric Neck: No JVD, No bruit, Trachea midline. Lungs:  Clear, Bilateral. Cardiac:  Regular rhythm, normal S1 and S2, no S3.  Abdomen:  Soft, non-tender. Extremities:  Right hand edema present. No cyanosis. No clubbing. CNS: AxOx3, Cranial nerves grossly intact, moves all 4 extremities. Right handed. Skin: Warm and dry.   Intake/Output from previous day: 06/10 0701 - 06/11 0700 In: 400 [IV Piggyback:400] Out: -     Lab Results: BMET    Component Value Date/Time   NA 135 06/04/2013 0510   K 4.6 06/04/2013 0510   CL 98 06/04/2013 0510   CO2 25 06/04/2013 0510   GLUCOSE 119* 06/04/2013 0510   BUN 16 06/04/2013 0510   CREATININE 0.95 06/04/2013 0510   CALCIUM 9.5 06/04/2013 0510   GFRNONAA 60* 06/04/2013 0510   GFRAA 69* 06/04/2013 0510   CBC    Component Value Date/Time   WBC 6.2 06/04/2013 0510   WBC 5.4 01/18/2011 1339   RBC 4.44 06/04/2013 0510   RBC 4.63 01/18/2011 1339   HGB 11.5* 06/04/2013 0510   HGB 12.6 01/18/2011 1339   HCT 36.0 06/04/2013 0510   HCT 38.9 01/18/2011 1339   PLT 312 06/04/2013 0510   PLT 240 01/18/2011 1339   MCV 81.1 06/04/2013 0510   MCV 84.1 01/18/2011 1339   MCH 25.9* 06/04/2013 0510   MCH 26.7 07/21/2010 1500   MCHC 31.9 06/04/2013 0510   MCHC 32.5 01/18/2011  1339   RDW 15.0 06/04/2013 0510   RDW 15.4* 01/18/2011 1339   LYMPHSABS 2.3 05/31/2013 0930   LYMPHSABS 1.6 01/18/2011 1339   MONOABS 0.5 05/31/2013 0930   MONOABS 0.4 01/18/2011 1339   EOSABS 0.1 05/31/2013 0930   EOSABS 0.2 01/18/2011 1339   BASOSABS 0.0 05/31/2013 0930   BASOSABS 0.0 01/18/2011 1339   CARDIAC ENZYMES Lab Results  Component Value Date   CKTOTAL 47 08/02/2012   CKMB 1.3 08/02/2012   TROPONINI <0.30 08/02/2012    Scheduled Meds: . albuterol  2.5 mg Nebulization TID  . aspirin EC  81 mg Oral Daily  . atorvastatin  20 mg Oral q1800  . cefTRIAXone (ROCEPHIN)  IV  1 g Intravenous Q24H  . cholecalciferol  1,000 Units Oral Daily  . diltiazem  240 mg Oral Daily  . docusate sodium  100 mg Oral BID  . furosemide  20 mg Oral Daily  . lisinopril  2.5 mg Oral Daily  . metFORMIN  500 mg Oral BID WC  . metoprolol tartrate  25 mg Oral BID  . pantoprazole  40 mg Oral Daily  . potassium chloride SA  20 mEq Oral Daily  . vancomycin  1,000 mg Intravenous Q12H  . warfarin  7.5 mg Oral q1800  .  Warfarin - Physician Dosing Inpatient   Does not apply q1800   Continuous Infusions:  PRN Meds:.albuterol, diazepam, Ipratropium-Albuterol, meclizine, oxyCODONE-acetaminophen, promethazine  Assessment/Plan: Right hand and forearm cellulitis  CAD  Atrial Fibrillation  DM, II  Hyperlipidemia  Obesity  Continue antibiotic. Increase coumadin.   LOS: 4 days    Orpah Cobb  MD  06/04/2013, 8:05 AM

## 2013-06-04 NOTE — Progress Notes (Signed)
S: some hand pain, pt states she is keeping elevated, moving fingers, dressing changes q shift.  O:Blood pressure 113/67, pulse 81, temperature 97.8 F (36.6 C), temperature source Oral, resp. rate 18, height 5\' 2"  (1.575 m), weight 101.1 kg (222 lb 14.2 oz), SpO2 96.00%. Results for orders placed during the hospital encounter of 05/31/13  WOUND CULTURE     Status: None   Collection Time    05/31/13 10:39 AM      Result Value Range Status   Specimen Description ARM   Final   Special Requests Normal   Final   Gram Stain     Final   Value: NO WBC SEEN     RARE SQUAMOUS EPITHELIAL CELLS PRESENT     NO ORGANISMS SEEN   Culture NO GROWTH 2 DAYS   Final   Report Status 06/02/2013 FINAL   Final   L hand/ wound: minimal erythema, warmth; no purulent drainage, swelling persists of fingers - IV still in L forearm!!  A: distal radius/ulna fx, cellulitis L hand - resolving   P: cont nwb, elevation, splint of L wrist, cont local wound care - wash with soap and water, no dirty dish water... Place small amount of antibiotic ointment over wound, cover;  F/u in my office in 2 wks.  Would d/c home on ?bactrim or doxy for an additional week.

## 2013-06-05 LAB — GLUCOSE, CAPILLARY: Glucose-Capillary: 92 mg/dL (ref 70–99)

## 2013-06-05 MED ORDER — WARFARIN SODIUM 10 MG PO TABS
10.0000 mg | ORAL_TABLET | Freq: Once | ORAL | Status: AC
  Administered 2013-06-05: 10 mg via ORAL
  Filled 2013-06-05: qty 1

## 2013-06-05 MED ORDER — BACITRACIN-NEOMYCIN-POLYMYXIN 400-5-5000 EX OINT
TOPICAL_OINTMENT | CUTANEOUS | Status: AC
  Administered 2013-06-05: 1
  Filled 2013-06-05: qty 1

## 2013-06-06 LAB — GLUCOSE, CAPILLARY: Glucose-Capillary: 137 mg/dL — ABNORMAL HIGH (ref 70–99)

## 2013-06-06 LAB — PROTIME-INR: INR: 2.79 — ABNORMAL HIGH (ref 0.00–1.49)

## 2013-06-06 MED ORDER — WARFARIN SODIUM 5 MG PO TABS
5.0000 mg | ORAL_TABLET | Freq: Once | ORAL | Status: AC
  Administered 2013-06-06: 5 mg via ORAL
  Filled 2013-06-06: qty 1

## 2013-06-07 LAB — PROTIME-INR
INR: 3.71 — ABNORMAL HIGH (ref 0.00–1.49)
Prothrombin Time: 34.6 seconds — ABNORMAL HIGH (ref 11.6–15.2)

## 2013-06-07 MED ORDER — CIPROFLOXACIN HCL 500 MG PO TABS
500.0000 mg | ORAL_TABLET | Freq: Two times a day (BID) | ORAL | Status: DC
  Administered 2013-06-07 – 2013-06-09 (×5): 500 mg via ORAL
  Filled 2013-06-07 (×7): qty 1

## 2013-06-07 NOTE — Progress Notes (Signed)
Orthopedic Tech Progress Note Patient Details:  Lisa Walters [birth year] [id number]  Ortho Devices Type of Ortho Device: Arm sling Ortho Device/Splint Location: modified volar-dorsal to Right Ortho Device/Splint Interventions: Application   Cammer, Mickie Bail 06/07/2013, 1:39 PM

## 2013-06-07 NOTE — Progress Notes (Signed)
Subjective:  Feeling better. Borderline high INR.   Objective:  Vital Signs in the last 24 hours: Temp:  [97.7 F (36.5 C)-98.1 F (36.7 C)] 97.9 F (36.6 C) (06/14 1403) Pulse Rate:  [67-89] 67 (06/14 1403) Cardiac Rhythm:  [-]  Resp:  [16-19] 16 (06/14 1403) BP: (107-131)/(69-80) 111/69 mmHg (06/14 1403) SpO2:  [97 %-100 %] 100 % (06/14 1403) Weight:  [102.7 kg (226 lb 6.6 oz)] 102.7 kg (226 lb 6.6 oz) (06/14 0510)  Physical Exam: BP Readings from Last 1 Encounters:  06/07/13 111/69     Wt Readings from Last 1 Encounters:  06/07/13 102.7 kg (226 lb 6.6 oz)    Weight change: 0.1 kg (3.5 oz)  HEENT: [place]/AT, Eyes-Brown, PERL, EOMI, Conjunctiva-Pink, Sclera-Non-icteric Neck: No JVD, No bruit, Trachea midline. Lungs:  Clear, Bilateral. Cardiac:  Regular rhythm, normal S1 and S2, no S3.  Abdomen:  Soft, non-tender. Extremities:  Right hand edema improving. Sling applied. No cyanosis. No clubbing. CNS: AxOx3, Cranial nerves grossly intact, moves all 4 extremities. Right handed. Skin: Warm and dry.   Intake/Output from previous day:      Lab Results: BMET    Component Value Date/Time   NA 135 06/04/2013 0510   K 4.6 06/04/2013 0510   CL 98 06/04/2013 0510   CO2 25 06/04/2013 0510   GLUCOSE 119* 06/04/2013 0510   BUN 16 06/04/2013 0510   CREATININE 0.95 06/04/2013 0510   CALCIUM 9.5 06/04/2013 0510   GFRNONAA 60* 06/04/2013 0510   GFRAA 69* 06/04/2013 0510   CBC    Component Value Date/Time   WBC 6.2 06/04/2013 0510   WBC 5.4 01/18/2011 1339   RBC 4.44 06/04/2013 0510   RBC 4.63 01/18/2011 1339   HGB 11.5* 06/04/2013 0510   HGB 12.6 01/18/2011 1339   HCT 36.0 06/04/2013 0510   HCT 38.9 01/18/2011 1339   PLT 312 06/04/2013 0510   PLT 240 01/18/2011 1339   MCV 81.1 06/04/2013 0510   MCV 84.1 01/18/2011 1339   MCH 25.9* 06/04/2013 0510   MCH 26.7 07/21/2010 1500   MCHC 31.9 06/04/2013 0510   MCHC 32.5 01/18/2011 1339   RDW 15.0 06/04/2013 0510   RDW 15.4* 01/18/2011 1339   LYMPHSABS 2.3 05/31/2013 0930   LYMPHSABS 1.6 01/18/2011 1339   MONOABS 0.5 05/31/2013 0930   MONOABS 0.4 01/18/2011 1339   EOSABS 0.1 05/31/2013 0930   EOSABS 0.2 01/18/2011 1339   BASOSABS 0.0 05/31/2013 0930   BASOSABS 0.0 01/18/2011 1339   CARDIAC ENZYMES Lab Results  Component Value Date   CKTOTAL 47 08/02/2012   CKMB 1.3 08/02/2012   TROPONINI <0.30 08/02/2012    Scheduled Meds: . albuterol  2.5 mg Nebulization TID  . aspirin EC  81 mg Oral Daily  . atorvastatin  20 mg Oral q1800  . cholecalciferol  1,000 Units Oral Daily  . ciprofloxacin  500 mg Oral BID  . diltiazem  240 mg Oral Daily  . docusate sodium  100 mg Oral BID  . furosemide  20 mg Oral Daily  . lisinopril  2.5 mg Oral Daily  . metFORMIN  500 mg Oral BID WC  . metoprolol tartrate  25 mg Oral BID  . pantoprazole  40 mg Oral Daily  . potassium chloride SA  20 mEq Oral Daily  . Warfarin - Physician Dosing Inpatient   Does not apply q1800   Continuous Infusions:  PRN Meds:.albuterol, diazepam, Ipratropium-Albuterol, meclizine, oxyCODONE-acetaminophen, promethazine  Assessment/Plan: Right hand and forearm cellulitis  CAD  Atrial Fibrillation  DM, II  Hyperlipidemia  Obesity  Oral antibiotic Sling support Hold coumadin tonight. Home in [place] if stable.   LOS: 7 days    Orpah Cobb  MD  06/07/2013, 6:12 PM

## 2013-06-08 LAB — PROTIME-INR: Prothrombin Time: 36 seconds — ABNORMAL HIGH (ref 11.6–15.2)

## 2013-06-08 LAB — BASIC METABOLIC PANEL
BUN: 19 mg/dL (ref 6–23)
CO2: 29 mEq/L (ref 19–32)
Calcium: 9.8 mg/dL (ref 8.4–10.5)
GFR calc non Af Amer: 49 mL/min — ABNORMAL LOW (ref >90)
Glucose, Bld: 105 mg/dL — ABNORMAL HIGH (ref 70–99)

## 2013-06-08 LAB — CBC
HCT: 34.2 % — ABNORMAL LOW (ref 36.0–46.0)
Hemoglobin: 11 g/dL — ABNORMAL LOW (ref 12.0–15.0)
MCH: 25.8 pg — ABNORMAL LOW (ref 26.0–34.0)
MCHC: 32.2 g/dL (ref 30.0–36.0)
MCV: 80.1 fL (ref 78.0–100.0)
RBC: 4.27 MIL/uL (ref 3.87–5.11)

## 2013-06-08 MED ORDER — MAGNESIUM CITRATE PO SOLN
1.0000 | Freq: Once | ORAL | Status: AC
  Administered 2013-06-08: 1 via ORAL
  Filled 2013-06-08: qty 296

## 2013-06-08 MED ORDER — FLEET ENEMA 7-19 GM/118ML RE ENEM
1.0000 | ENEMA | Freq: Once | RECTAL | Status: AC
  Administered 2013-06-08: 1 via RECTAL
  Filled 2013-06-08: qty 1

## 2013-06-08 MED ORDER — BACITRACIN-NEOMYCIN-POLYMYXIN 400-5-5000 EX OINT
TOPICAL_OINTMENT | CUTANEOUS | Status: AC
  Administered 2013-06-08: 13:00:00
  Filled 2013-06-08: qty 1

## 2013-06-08 NOTE — Progress Notes (Signed)
Subjective:  Elevated INR in spite of holding warfarin yesterday. Also has constipation.  Objective:  Vital Signs in the last 24 hours: Temp:  [97.8 F (36.6 C)-98.1 F (36.7 C)] 97.8 F (36.6 C) (06/15 0620) Pulse Rate:  [67-79] 79 (06/15 0620) Cardiac Rhythm:  [-]  Resp:  [16-18] 18 (06/15 0620) BP: (111-130)/(69-78) 126/78 mmHg (06/15 0620) SpO2:  [96 %-100 %] 97 % (06/15 0620) Weight:  [102.5 kg (225 lb 15.5 oz)] 102.5 kg (225 lb 15.5 oz) (06/15 1610)  Physical Exam: BP Readings from Last 1 Encounters:  06/08/13 126/78     Wt Readings from Last 1 Encounters:  06/08/13 102.5 kg (225 lb 15.5 oz)    Weight change: -0.2 kg (-7.1 oz)  HEENT: [place]/AT, Eyes-Brown, PERL, EOMI, Conjunctiva-Pink, Sclera-Non-icteric Neck: No JVD, No bruit, Trachea midline. Lungs:  Clear, Bilateral. Cardiac:  Regular rhythm, normal S1 and S2, no S3.  Abdomen:  Soft, non-tender. Extremities:  Mild right hand edema present. No cyanosis. No clubbing. CNS: AxOx3, Cranial nerves grossly intact, moves all 4 extremities. Right handed. Skin: Warm and dry.   Intake/Output from previous day:      Lab Results: BMET    Component Value Date/Time   NA 136 06/08/2013 0525   K 4.3 06/08/2013 0525   CL 99 06/08/2013 0525   CO2 29 06/08/2013 0525   GLUCOSE 105* 06/08/2013 0525   BUN 19 06/08/2013 0525   CREATININE 1.11* 06/08/2013 0525   CALCIUM 9.8 06/08/2013 0525   GFRNONAA 49* 06/08/2013 0525   GFRAA 57* 06/08/2013 0525   CBC    Component Value Date/Time   WBC 6.1 06/08/2013 0525   WBC 5.4 01/18/2011 1339   RBC 4.27 06/08/2013 0525   RBC 4.63 01/18/2011 1339   HGB 11.0* 06/08/2013 0525   HGB 12.6 01/18/2011 1339   HCT 34.2* 06/08/2013 0525   HCT 38.9 01/18/2011 1339   PLT 328 06/08/2013 0525   PLT 240 01/18/2011 1339   MCV 80.1 06/08/2013 0525   MCV 84.1 01/18/2011 1339   MCH 25.8* 06/08/2013 0525   MCH 26.7 07/21/2010 1500   MCHC 32.2 06/08/2013 0525   MCHC 32.5 01/18/2011 1339   RDW 14.9 06/08/2013 0525   RDW  15.4* 01/18/2011 1339   LYMPHSABS 2.3 05/31/2013 0930   LYMPHSABS 1.6 01/18/2011 1339   MONOABS 0.5 05/31/2013 0930   MONOABS 0.4 01/18/2011 1339   EOSABS 0.1 05/31/2013 0930   EOSABS 0.2 01/18/2011 1339   BASOSABS 0.0 05/31/2013 0930   BASOSABS 0.0 01/18/2011 1339   CARDIAC ENZYMES Lab Results  Component Value Date   CKTOTAL 47 08/02/2012   CKMB 1.3 08/02/2012   TROPONINI <0.30 08/02/2012    Scheduled Meds: . albuterol  2.5 mg Nebulization TID  . aspirin EC  81 mg Oral Daily  . atorvastatin  20 mg Oral q1800  . cholecalciferol  1,000 Units Oral Daily  . ciprofloxacin  500 mg Oral BID  . diltiazem  240 mg Oral Daily  . docusate sodium  100 mg Oral BID  . furosemide  20 mg Oral Daily  . lisinopril  2.5 mg Oral Daily  . metFORMIN  500 mg Oral BID WC  . metoprolol tartrate  25 mg Oral BID  . pantoprazole  40 mg Oral Daily  . potassium chloride SA  20 mEq Oral Daily  . sodium phosphate  1 enema Rectal Once  . Warfarin - Physician Dosing Inpatient   Does not apply q1800   Continuous Infusions:  PRN  Meds:.albuterol, diazepam, Ipratropium-Albuterol, meclizine, oxyCODONE-acetaminophen, promethazine  Assessment/Plan:  Right hand and forearm cellulitis  CAD  Atrial Fibrillation  DM, II  Hyperlipidemia  Obesity Supra-therapeutic INR  Hold coumadin. Fleet's enema   LOS: 8 days    Orpah Cobb  MD  06/08/2013, 12:32 PM

## 2013-06-08 NOTE — Progress Notes (Signed)
Pt states had a tiny BM as a result of the fleets enema.

## 2013-06-09 LAB — GLUCOSE, CAPILLARY
Glucose-Capillary: 123 mg/dL — ABNORMAL HIGH (ref 70–99)
Glucose-Capillary: 132 mg/dL — ABNORMAL HIGH (ref 70–99)

## 2013-06-09 MED ORDER — BACITRACIN-NEOMYCIN-POLYMYXIN 400-5-5000 EX OINT
TOPICAL_OINTMENT | CUTANEOUS | Status: AC
  Filled 2013-06-09: qty 1

## 2013-06-09 MED ORDER — CIPROFLOXACIN HCL 500 MG PO TABS: 500.0000 mg | ORAL_TABLET | Freq: Two times a day (BID) | ORAL | Status: DC

## 2013-06-09 MED ORDER — DSS 100 MG PO CAPS: 100.0000 mg | ORAL_CAPSULE | Freq: Two times a day (BID) | ORAL | Status: DC

## 2013-06-09 MED ORDER — BACITRACIN-NEOMYCIN-POLYMYXIN 400-5-5000 EX OINT
TOPICAL_OINTMENT | CUTANEOUS | Status: AC
  Filled 2013-06-09: qty 6

## 2013-06-09 MED ORDER — PHYTONADIONE 5 MG PO TABS
5.0000 mg | ORAL_TABLET | Freq: Once | ORAL | Status: AC
  Administered 2013-06-09: 5 mg via ORAL
  Filled 2013-06-09: qty 1

## 2013-06-09 MED ORDER — OXYCODONE-ACETAMINOPHEN 5-325 MG PO TABS
1.0000 | ORAL_TABLET | Freq: Four times a day (QID) | ORAL | Status: DC | PRN
  Administered 2013-06-09: 1 via ORAL
  Filled 2013-06-09: qty 1

## 2013-06-09 MED ORDER — ASPIRIN 81 MG PO TBEC: 81.0000 mg | DELAYED_RELEASE_TABLET | Freq: Every day | ORAL | Status: DC

## 2013-06-09 NOTE — Progress Notes (Signed)
Dsg supplies and ABX ung sent home with pt to dress right arm daily. Pt stated that she and granddaughter would do dsg changes.

## 2013-06-09 NOTE — Progress Notes (Signed)
Verbally understood dc instructions, no questions ask 

## 2013-06-09 NOTE — Care Management Note (Signed)
  Page 1 of 1   06/09/2013     3:19:05 PM   CARE MANAGEMENT NOTE 06/09/2013  Patient:  Lisa Walters, Lisa Walters   Account Number:  [id number]  Date Initiated:  06/04/2013  Documentation initiated by:  Carlyle Lipa  Subjective/Objective Assessment:   R hand/forearm cellulitis s/p fracture; IV abx, Coumadin for Afib     Action/Plan:   await stability of afib and cellulitis for d/c, likely home   Anticipated DC Date:  06/09/2013   Anticipated DC Plan:  HOME W HOME HEALTH SERVICES         Choice offered to / List presented to:          [facility] arranged  HH-1 RN      [facility] agency  [facility]   Status of service:  Completed, signed off Medicare Important Message given?   (If response is "NO", the following Medicare IM given date fields will be blank) Date Medicare IM given:   Date Additional Medicare IM given:    Discharge Disposition:    Per UR Regulation:  Reviewed for med. necessity/level of care/duration of stay  If discussed at Long Length of Stay Meetings, dates discussed:    Comments:  06-09-13 [facility] to draw INR on 06-10-13 and call to DR [facility] [phone]. Facesheet information confirmed with patient. Ronny Flurry RN BSN

## 2013-06-09 NOTE — Progress Notes (Signed)
Subjective:  Good BM per patient. INR now 4.0 due to antibiotics.  Objective:  Vital Signs in the last 24 hours: Temp:  [97.6 F (36.4 C)-98.1 F (36.7 C)] 97.7 F (36.5 C) (06/16 0553) Pulse Rate:  [69-83] 77 (06/16 0553) Cardiac Rhythm:  [-]  Resp:  [17-18] 18 (06/16 0553) BP: (100-116)/(61-73) 100/61 mmHg (06/16 0553) SpO2:  [90 %-100 %] 95 % (06/16 0850) Weight:  [98 kg (216 lb 0.8 oz)] 98 kg (216 lb 0.8 oz) (06/16 0553)  Physical Exam: BP Readings from Last 1 Encounters:  06/09/13 100/61     Wt Readings from Last 1 Encounters:  06/09/13 98 kg (216 lb 0.8 oz)    Weight change: -4.5 kg (-9 lb 14.7 oz)  HEENT: [place]/AT, Eyes-Brown, PERL, EOMI, Conjunctiva-Pink, Sclera-Non-icteric Neck: No JVD, No bruit, Trachea midline. Lungs:  Clear, Bilateral. Cardiac:  Regular rhythm, normal S1 and S2, no S3.  Abdomen:  Soft, non-tender. Extremities:  Mild right hand edema and splint present. No cyanosis. No clubbing. CNS: AxOx3, Cranial nerves grossly intact, moves all 4 extremities. Right handed. Skin: Warm and dry.   Intake/Output from previous day: 06/15 0701 - 06/16 0700 In: 300 [P.O.:300] Out: 1 [Stool:1]    Lab Results: BMET    Component Value Date/Time   NA 136 06/08/2013 0525   K 4.3 06/08/2013 0525   CL 99 06/08/2013 0525   CO2 29 06/08/2013 0525   GLUCOSE 105* 06/08/2013 0525   BUN 19 06/08/2013 0525   CREATININE 1.11* 06/08/2013 0525   CALCIUM 9.8 06/08/2013 0525   GFRNONAA 49* 06/08/2013 0525   GFRAA 57* 06/08/2013 0525   CBC    Component Value Date/Time   WBC 6.1 06/08/2013 0525   WBC 5.4 01/18/2011 1339   RBC 4.27 06/08/2013 0525   RBC 4.63 01/18/2011 1339   HGB 11.0* 06/08/2013 0525   HGB 12.6 01/18/2011 1339   HCT 34.2* 06/08/2013 0525   HCT 38.9 01/18/2011 1339   PLT 328 06/08/2013 0525   PLT 240 01/18/2011 1339   MCV 80.1 06/08/2013 0525   MCV 84.1 01/18/2011 1339   MCH 25.8* 06/08/2013 0525   MCH 26.7 07/21/2010 1500   MCHC 32.2 06/08/2013 0525   MCHC 32.5  01/18/2011 1339   RDW 14.9 06/08/2013 0525   RDW 15.4* 01/18/2011 1339   LYMPHSABS 2.3 05/31/2013 0930   LYMPHSABS 1.6 01/18/2011 1339   MONOABS 0.5 05/31/2013 0930   MONOABS 0.4 01/18/2011 1339   EOSABS 0.1 05/31/2013 0930   EOSABS 0.2 01/18/2011 1339   BASOSABS 0.0 05/31/2013 0930   BASOSABS 0.0 01/18/2011 1339   CARDIAC ENZYMES Lab Results  Component Value Date   CKTOTAL 47 08/02/2012   CKMB 1.3 08/02/2012   TROPONINI <0.30 08/02/2012    Scheduled Meds: . albuterol  2.5 mg Nebulization TID  . aspirin EC  81 mg Oral Daily  . atorvastatin  20 mg Oral q1800  . cholecalciferol  1,000 Units Oral Daily  . ciprofloxacin  500 mg Oral BID  . diltiazem  240 mg Oral Daily  . docusate sodium  100 mg Oral BID  . furosemide  20 mg Oral Daily  . lisinopril  2.5 mg Oral Daily  . metFORMIN  500 mg Oral BID WC  . metoprolol tartrate  25 mg Oral BID  . pantoprazole  40 mg Oral Daily  . phytonadione  5 mg Oral Once  . potassium chloride SA  20 mEq Oral Daily  . Warfarin - Physician Dosing Inpatient  Does not apply q1800   Continuous Infusions:  PRN Meds:.albuterol, diazepam, Ipratropium-Albuterol, meclizine, oxyCODONE-acetaminophen, promethazine  Assessment/Plan: Right hand and forearm cellulitis  CAD  Atrial Fibrillation  DM, II  Hyperlipidemia  Obesity  Supra-therapeutic INR  Hold coumadin and add Vit. K+   LOS: 9 days    Orpah Cobb  MD  06/09/2013, 9:39 AM

## 2013-06-10 NOTE — Discharge Summary (Signed)
Physician Discharge Summary  Patient ID: SHIYA FOGELMAN MRN: [id number] DOB/AGE: Sep 25, 1941 72 y.o.  Admit date: 05/31/2013 Discharge date: 06/10/2013  Admission Diagnoses: Right hand and forearm cellulitis  CAD  Atrial Fibrillation  DM, II  Hyperlipidemia  Obesity   Discharge Diagnoses:  Principle Problem: * Right hand and forearm cellulitis * CAD, native vessel  Atrial Fibrillation  DM, II  Hyperlipidemia  Obesity  Constipation Supra-therapeutic INR   Discharged Condition: good  Hospital Course: 72 years old female with right hand and forearm swelling post fracture on 05/26/2013,was treated with ceftriaxone and Vancomycin. Her condition gradually improved. She was treated with oral ciprofloxacin and discharged in stable condition. She was given vitamin K for her elevated INR.  Consults: None  Significant Diagnostic Studies: labs: Normal CBC and BMET.  Treatments: antibiotics: vancomycin and ceftriaxone  Discharge Exam: Blood pressure 111/85, pulse 74, temperature 97.6 F (36.4 C), temperature source Oral, resp. rate 18, height 5\' 2"  (1.575 m), weight 98 kg (216 lb 0.8 oz), SpO2 95.00%. HEENT: [place]/AT, Eyes-Brown, PERL, EOMI, Conjunctiva-Pink, Sclera-Non-icteric  Neck: No JVD, No bruit, Trachea midline.  Lungs: Clear, Bilateral.  Cardiac: Regular rhythm, normal S1 and S2, no S3.  Abdomen: Soft, non-tender.  Extremities: Mild right hand edema and splint present. No cyanosis. No clubbing.  CNS: AxOx3, Cranial nerves grossly intact, moves all 4 extremities. Right handed.  Skin: Warm and dry.   Disposition: 01-Home or Self Care     Medication List    STOP taking these medications       cephALEXin 500 MG capsule  Commonly known as:  KEFLEX     warfarin 5 MG tablet  Commonly known as:  COUMADIN      TAKE these medications       albuterol (5 MG/ML) 0.5% nebulizer solution  Commonly known as:  PROVENTIL  Take 2.5 mg by nebulization every 6 (six) hours as needed  for wheezing or shortness of breath.     albuterol-ipratropium 18-103 MCG/ACT inhaler  Commonly known as:  COMBIVENT  Inhale 2 puffs into the lungs every 6 (six) hours as needed for wheezing or shortness of breath.     ipratropium-albuterol 0.5-2.5 (3) MG/3ML Soln  Commonly known as:  DUONEB  Take 3 mLs by nebulization every 6 (six) hours as needed (wheezing/shortness of breath).     aspirin 81 MG EC tablet  Take 1 tablet (81 mg total) by mouth daily.     cholecalciferol 1000 UNITS tablet  Commonly known as:  VITAMIN D  Take 1,000 Units by mouth daily.     ciprofloxacin 500 MG tablet  Commonly known as:  CIPRO  Take 1 tablet (500 mg total) by mouth 2 (two) times daily.     diazepam 5 MG tablet  Commonly known as:  VALIUM  Take 5 mg by mouth at bedtime as needed for anxiety (for muscle relaxation).     diltiazem 240 MG 24 hr capsule  Commonly known as:  TIAZAC  Take 240 mg by mouth daily.     DSS 100 MG Caps  Take 100 mg by mouth 2 (two) times daily.     furosemide 20 MG tablet  Commonly known as:  LASIX  Take 20 mg by mouth daily.     lisinopril 2.5 MG tablet  Commonly known as:  PRINIVIL,ZESTRIL  Take 2.5 mg by mouth daily.     meclizine 25 MG tablet  Commonly known as:  ANTIVERT  Take 25 mg by mouth every 6 (six)  hours as needed for dizziness (for vertigo).     metFORMIN 500 MG tablet  Commonly known as:  GLUCOPHAGE  Take 500 mg by mouth 2 (two) times daily with a meal.     metoprolol tartrate 25 MG tablet  Commonly known as:  LOPRESSOR  Take 25 mg by mouth 2 (two) times daily.     montelukast 10 MG tablet  Commonly known as:  SINGULAIR  Take 10 mg by mouth every morning.     omeprazole 20 MG capsule  Commonly known as:  PRILOSEC  Take 20 mg by mouth daily.     ondansetron 4 MG tablet  Commonly known as:  ZOFRAN  Take 4 mg by mouth every 8 (eight) hours as needed for nausea (take if pain medication causes nausea).     oxyCODONE-acetaminophen 5-325 MG  per tablet  Commonly known as:  PERCOCET/ROXICET  Take 1 tablet by mouth every 4 (four) hours as needed for pain (for pain).     potassium chloride SA 20 MEQ tablet  Commonly known as:  K-DUR,KLOR-CON  Take 20 mEq by mouth daily.     promethazine 25 MG tablet  Commonly known as:  PHENERGAN  Take 25 mg by mouth every 6 (six) hours as needed for nausea (for nausea and vomiting).     simvastatin 40 MG tablet  Commonly known as:  ZOCOR  Take 40 mg by mouth every other day.           Follow-up Information   Follow up with Molinda Bailiff, MD In 2 weeks. (call to schedule an appontment)    Contact information:   [address] [place] [address] [phone]       Follow up with [facility], MD. Schedule an appointment as soon as possible for a visit in 1 week.   Contact information:   [address] [place] [address] [phone]       Signed: Ricki Rodriguez 06/10/2013, 8:51 AM

## 2013-06-11 NOTE — Progress Notes (Signed)
Late entry: Note missing or lost  Subjective:  9/10 pain in hand/wrist.  Objective:  Vital Signs in the last 24 hours:  P-100, R-16, BP-108/67, Oxygen sat- 100 %. T-97.5 F  Physical Exam: BP Readings from Last 1 Encounters:  06/05/13 108/67     Wt Readings from Last 1 Encounters:    Weight change:   HEENT: [place]/AT, Eyes-Brown, PERL, EOMI, Conjunctiva-Pink, Sclera-Non-icteric Neck: No JVD, No bruit, Trachea midline. Lungs:  Clear, Bilateral. Cardiac:  Regular rhythm, normal S1 and S2, no S3.  Abdomen:  Soft, non-tender. Extremities:  Right hand edema present. No cyanosis. No clubbing. CNS: AxOx3, Cranial nerves grossly intact, moves all 4 extremities. Right handed. Skin: Warm and dry.   Intake/Output from previous day:      Lab Results: BMET    Component Value Date/Time   NA 136 06/08/2013 0525   K 4.3 06/08/2013 0525   CL 99 06/08/2013 0525   CO2 29 06/08/2013 0525   GLUCOSE 105* 06/08/2013 0525   BUN 19 06/08/2013 0525   CREATININE 1.11* 06/08/2013 0525   CALCIUM 9.8 06/08/2013 0525   GFRNONAA 49* 06/08/2013 0525   GFRAA 57* 06/08/2013 0525   CBC    Component Value Date/Time   WBC 6.1 06/08/2013 0525   WBC 5.4 01/18/2011 1339   RBC 4.27 06/08/2013 0525   RBC 4.63 01/18/2011 1339   HGB 11.0* 06/08/2013 0525   HGB 12.6 01/18/2011 1339   HCT 34.2* 06/08/2013 0525   HCT 38.9 01/18/2011 1339   PLT 328 06/08/2013 0525   PLT 240 01/18/2011 1339   MCV 80.1 06/08/2013 0525   MCV 84.1 01/18/2011 1339   MCH 25.8* 06/08/2013 0525   MCH 26.7 07/21/2010 1500   MCHC 32.2 06/08/2013 0525   MCHC 32.5 01/18/2011 1339   RDW 14.9 06/08/2013 0525   RDW 15.4* 01/18/2011 1339   LYMPHSABS 2.3 05/31/2013 0930   LYMPHSABS 1.6 01/18/2011 1339   MONOABS 0.5 05/31/2013 0930   MONOABS 0.4 01/18/2011 1339   EOSABS 0.1 05/31/2013 0930   EOSABS 0.2 01/18/2011 1339   BASOSABS 0.0 05/31/2013 0930   BASOSABS 0.0 01/18/2011 1339   CARDIAC ENZYMES Lab Results  Component Value Date   CKTOTAL 47 08/02/2012   CKMB 1.3  08/02/2012   TROPONINI <0.30 08/02/2012    Scheduled Meds: Continuous Infusions: PRN Meds:.    Assessment/Plan: Right hand and forearm cellulitis  CAD  Atrial Fibrillation  DM, II  Hyperlipidemia  Obesity  Increase coumadin to 10 mg. tonight.    LOS: 5 days    Orpah Cobb  MD  06/11/2013, 9:08 AM

## 2013-06-11 NOTE — Progress Notes (Signed)
Late entry: Note missing or lost  Subjective:  Feeling better post adjusting arm sling yesterday. Improving INR to 2.79  Objective:  Vital Signs in the last 24 hours:  P-80, R-18, BP-117/71. T-97.9 F, Oxygen sat-97 %  Physical Exam: BP Readings from Last 1 Encounters:  06/06/13 117/71     Wt Readings from Last 1 Encounters:       Weight change:   HEENT: [place]/AT, Eyes-Brown, PERL, EOMI, Conjunctiva-Pink, Sclera-Non-icteric Neck: No JVD, No bruit, Trachea midline. Lungs:  Clear, Bilateral. Cardiac:  Regular rhythm, normal S1 and S2, no S3.  Abdomen:  Soft, non-tender. Extremities:  Decreasing hand edema.. No cyanosis. No clubbing. CNS: AxOx3, Cranial nerves grossly intact, moves all 4 extremities. Right handed. Skin: Warm and dry.   Intake/Output from previous day:      Lab Results: BMET    Component Value Date/Time   NA 136 06/08/2013 0525   K 4.3 06/08/2013 0525   CL 99 06/08/2013 0525   CO2 29 06/08/2013 0525   GLUCOSE 105* 06/08/2013 0525   BUN 19 06/08/2013 0525   CREATININE 1.11* 06/08/2013 0525   CALCIUM 9.8 06/08/2013 0525   GFRNONAA 49* 06/08/2013 0525   GFRAA 57* 06/08/2013 0525   CBC    Component Value Date/Time   WBC 6.1 06/08/2013 0525   WBC 5.4 01/18/2011 1339   RBC 4.27 06/08/2013 0525   RBC 4.63 01/18/2011 1339   HGB 11.0* 06/08/2013 0525   HGB 12.6 01/18/2011 1339   HCT 34.2* 06/08/2013 0525   HCT 38.9 01/18/2011 1339   PLT 328 06/08/2013 0525   PLT 240 01/18/2011 1339   MCV 80.1 06/08/2013 0525   MCV 84.1 01/18/2011 1339   MCH 25.8* 06/08/2013 0525   MCH 26.7 07/21/2010 1500   MCHC 32.2 06/08/2013 0525   MCHC 32.5 01/18/2011 1339   RDW 14.9 06/08/2013 0525   RDW 15.4* 01/18/2011 1339   LYMPHSABS 2.3 05/31/2013 0930   LYMPHSABS 1.6 01/18/2011 1339   MONOABS 0.5 05/31/2013 0930   MONOABS 0.4 01/18/2011 1339   EOSABS 0.1 05/31/2013 0930   EOSABS 0.2 01/18/2011 1339   BASOSABS 0.0 05/31/2013 0930   BASOSABS 0.0 01/18/2011 1339   CARDIAC ENZYMES Lab Results  Component  Value Date   CKTOTAL 47 08/02/2012   CKMB 1.3 08/02/2012   TROPONINI <0.30 08/02/2012    Scheduled Meds: Continuous Infusions: PRN Meds:.    Assessment/Plan: Right hand and forearm cellulitis  CAD  Atrial Fibrillation  DM, II  Hyperlipidemia  Obesity  Decrease coumadin to 5 mg. tonight.    LOS: 6 days    Orpah Cobb  MD  06/11/2013, 9:14 AM

## 2014-06-05 ENCOUNTER — Other Ambulatory Visit: Payer: Self-pay | Admitting: Internal Medicine

## 2014-06-05 DIAGNOSIS — I10 Essential (primary) hypertension: Secondary | ICD-10-CM

## 2014-06-10 ENCOUNTER — Ambulatory Visit
Admission: RE | Admit: 2014-06-10 | Discharge: 2014-06-10 | Disposition: A | Discharge status: Home / Self Care | Payer: Medicare Other | Source: Ambulatory Visit | Attending: Internal Medicine | Admitting: Internal Medicine

## 2014-06-10 DIAGNOSIS — I10 Essential (primary) hypertension: Secondary | ICD-10-CM

## 2014-10-26 ENCOUNTER — Encounter (HOSPITAL_COMMUNITY): Payer: Self-pay | Admitting: General Practice

## 2014-12-03 ENCOUNTER — Encounter (HOSPITAL_COMMUNITY): Payer: Self-pay | Admitting: Cardiovascular Disease

## 2015-05-06 ENCOUNTER — Institutional Professional Consult (permissible substitution): Payer: Medicare Other | Admitting: Pulmonary Disease

## 2015-06-30 ENCOUNTER — Other Ambulatory Visit: Payer: Self-pay | Admitting: Internal Medicine

## 2015-06-30 ENCOUNTER — Encounter (HOSPITAL_COMMUNITY): Payer: Self-pay | Admitting: Physical Medicine and Rehabilitation

## 2015-06-30 ENCOUNTER — Emergency Department (HOSPITAL_COMMUNITY): Payer: Medicare Other

## 2015-06-30 ENCOUNTER — Emergency Department (HOSPITAL_COMMUNITY)
Admission: EM | Admit: 2015-06-30 | Discharge: 2015-06-30 | Disposition: A | Discharge status: Home / Self Care | Payer: Medicare Other | Attending: Emergency Medicine | Admitting: Emergency Medicine

## 2015-06-30 ENCOUNTER — Institutional Professional Consult (permissible substitution): Payer: Medicare Other | Admitting: Pulmonary Disease

## 2015-06-30 DIAGNOSIS — M1990 Unspecified osteoarthritis, unspecified site: Secondary | ICD-10-CM | POA: Insufficient documentation

## 2015-06-30 DIAGNOSIS — E559 Vitamin D deficiency, unspecified: Secondary | ICD-10-CM | POA: Diagnosis not present

## 2015-06-30 DIAGNOSIS — K219 Gastro-esophageal reflux disease without esophagitis: Secondary | ICD-10-CM | POA: Insufficient documentation

## 2015-06-30 DIAGNOSIS — Z792 Long term (current) use of antibiotics: Secondary | ICD-10-CM | POA: Insufficient documentation

## 2015-06-30 DIAGNOSIS — Z853 Personal history of malignant neoplasm of breast: Secondary | ICD-10-CM | POA: Insufficient documentation

## 2015-06-30 DIAGNOSIS — Z7982 Long term (current) use of aspirin: Secondary | ICD-10-CM | POA: Insufficient documentation

## 2015-06-30 DIAGNOSIS — Z87891 Personal history of nicotine dependence: Secondary | ICD-10-CM | POA: Insufficient documentation

## 2015-06-30 DIAGNOSIS — Z87448 Personal history of other diseases of urinary system: Secondary | ICD-10-CM | POA: Insufficient documentation

## 2015-06-30 DIAGNOSIS — Z79899 Other long term (current) drug therapy: Secondary | ICD-10-CM | POA: Insufficient documentation

## 2015-06-30 DIAGNOSIS — Z9981 Dependence on supplemental oxygen: Secondary | ICD-10-CM | POA: Diagnosis not present

## 2015-06-30 DIAGNOSIS — R42 Dizziness and giddiness: Secondary | ICD-10-CM | POA: Insufficient documentation

## 2015-06-30 DIAGNOSIS — Z8781 Personal history of (healed) traumatic fracture: Secondary | ICD-10-CM | POA: Diagnosis not present

## 2015-06-30 DIAGNOSIS — I10 Essential (primary) hypertension: Secondary | ICD-10-CM | POA: Insufficient documentation

## 2015-06-30 DIAGNOSIS — M8580 Other specified disorders of bone density and structure, unspecified site: Secondary | ICD-10-CM | POA: Insufficient documentation

## 2015-06-30 DIAGNOSIS — J45901 Unspecified asthma with (acute) exacerbation: Secondary | ICD-10-CM | POA: Diagnosis not present

## 2015-06-30 DIAGNOSIS — Z8701 Personal history of pneumonia (recurrent): Secondary | ICD-10-CM | POA: Insufficient documentation

## 2015-06-30 DIAGNOSIS — J40 Bronchitis, not specified as acute or chronic: Secondary | ICD-10-CM

## 2015-06-30 DIAGNOSIS — R0602 Shortness of breath: Secondary | ICD-10-CM | POA: Diagnosis present

## 2015-06-30 DIAGNOSIS — Z862 Personal history of diseases of the blood and blood-forming organs and certain disorders involving the immune mechanism: Secondary | ICD-10-CM | POA: Insufficient documentation

## 2015-06-30 DIAGNOSIS — G43909 Migraine, unspecified, not intractable, without status migrainosus: Secondary | ICD-10-CM | POA: Insufficient documentation

## 2015-06-30 DIAGNOSIS — Z9889 Other specified postprocedural states: Secondary | ICD-10-CM | POA: Insufficient documentation

## 2015-06-30 DIAGNOSIS — E119 Type 2 diabetes mellitus without complications: Secondary | ICD-10-CM | POA: Diagnosis not present

## 2015-06-30 DIAGNOSIS — R600 Localized edema: Secondary | ICD-10-CM | POA: Diagnosis not present

## 2015-06-30 DIAGNOSIS — I509 Heart failure, unspecified: Secondary | ICD-10-CM | POA: Insufficient documentation

## 2015-06-30 DIAGNOSIS — E785 Hyperlipidemia, unspecified: Secondary | ICD-10-CM | POA: Diagnosis not present

## 2015-06-30 DIAGNOSIS — Z8669 Personal history of other diseases of the nervous system and sense organs: Secondary | ICD-10-CM | POA: Diagnosis not present

## 2015-06-30 DIAGNOSIS — G8929 Other chronic pain: Secondary | ICD-10-CM | POA: Diagnosis not present

## 2015-06-30 LAB — COMPREHENSIVE METABOLIC PANEL
ALBUMIN: 3.1 g/dL — AB (ref 3.5–5.0)
ALK PHOS: 91 U/L (ref 38–126)
ALT: 13 U/L — AB (ref 14–54)
AST: 19 U/L (ref 15–41)
Anion gap: 8 (ref 5–15)
BILIRUBIN TOTAL: 0.3 mg/dL (ref 0.3–1.2)
BUN: 17 mg/dL (ref 6–20)
CO2: 23 mmol/L (ref 22–32)
Calcium: 8.8 mg/dL — ABNORMAL LOW (ref 8.9–10.3)
Chloride: 108 mmol/L (ref 101–111)
Creatinine, Ser: 1.18 mg/dL — ABNORMAL HIGH (ref 0.44–1.00)
GFR calc Af Amer: 52 mL/min — ABNORMAL LOW (ref >60)
GFR calc non Af Amer: 45 mL/min — ABNORMAL LOW (ref >60)
GLUCOSE: 105 mg/dL — AB (ref 65–99)
Potassium: 4.1 mmol/L (ref 3.5–5.1)
SODIUM: 139 mmol/L (ref 135–145)
TOTAL PROTEIN: 8 g/dL (ref 6.5–8.1)

## 2015-06-30 LAB — CBC WITH DIFFERENTIAL/PLATELET
BASOS ABS: 0 10*3/uL (ref 0.0–0.1)
BASOS PCT: 0 % (ref 0–1)
EOS ABS: 0.2 10*3/uL (ref 0.0–0.7)
EOS PCT: 2 % (ref 0–5)
HCT: 34.7 % — ABNORMAL LOW (ref 36.0–46.0)
Hemoglobin: 10.9 g/dL — ABNORMAL LOW (ref 12.0–15.0)
Lymphocytes Relative: 29 % (ref 12–46)
Lymphs Abs: 2.1 10*3/uL (ref 0.7–4.0)
MCH: 26.2 pg (ref 26.0–34.0)
MCHC: 31.4 g/dL (ref 30.0–36.0)
MCV: 83.4 fL (ref 78.0–100.0)
Monocytes Absolute: 0.4 10*3/uL (ref 0.1–1.0)
Monocytes Relative: 6 % (ref 3–12)
Neutro Abs: 4.7 10*3/uL (ref 1.7–7.7)
Neutrophils Relative %: 63 % (ref 43–77)
PLATELETS: 298 10*3/uL (ref 150–400)
RBC: 4.16 MIL/uL (ref 3.87–5.11)
RDW: 16.7 % — AB (ref 11.5–15.5)
WBC: 7.4 10*3/uL (ref 4.0–10.5)

## 2015-06-30 LAB — BRAIN NATRIURETIC PEPTIDE: B Natriuretic Peptide: 296.3 pg/mL — ABNORMAL HIGH (ref 0.0–100.0)

## 2015-06-30 LAB — PROTIME-INR
INR: 2.82 — AB (ref 0.00–1.49)
Prothrombin Time: 29.2 seconds — ABNORMAL HIGH (ref 11.6–15.2)

## 2015-06-30 LAB — I-STAT TROPONIN, ED: TROPONIN I, POC: 0 ng/mL (ref 0.00–0.08)

## 2015-06-30 MED ORDER — IPRATROPIUM-ALBUTEROL 0.5-2.5 (3) MG/3ML IN SOLN
3.0000 mL | Freq: Once | RESPIRATORY_TRACT | Status: AC
  Administered 2015-06-30: 3 mL via RESPIRATORY_TRACT
  Filled 2015-06-30: qty 3

## 2015-06-30 MED ORDER — PREDNISONE 20 MG PO TABS
60.0000 mg | ORAL_TABLET | Freq: Once | ORAL | Status: AC
  Administered 2015-06-30: 60 mg via ORAL
  Filled 2015-06-30: qty 3

## 2015-06-30 MED ORDER — PREDNISONE 20 MG PO TABS: 40.0000 mg | ORAL_TABLET | Freq: Every day | ORAL | Status: DC

## 2015-06-30 NOTE — Discharge Instructions (Signed)
Prednisone until all gone. Continue inhalers. Finish zithromycin. Follow up with your doctor for recheck in 2-3 days. Return if worsening.   Acute Bronchitis Bronchitis is inflammation of the airways that extend from the windpipe into the lungs (bronchi). The inflammation often causes mucus to develop. This leads to a cough, which is the most common symptom of bronchitis.  In acute bronchitis, the condition usually develops suddenly and goes away over time, usually in a couple weeks. Smoking, allergies, and asthma can make bronchitis worse. Repeated episodes of bronchitis may cause further lung problems.  CAUSES Acute bronchitis is most often caused by the same virus that causes a cold. The virus can spread from person to person (contagious) through coughing, sneezing, and touching contaminated objects. SIGNS AND SYMPTOMS   Cough.   Fever.   Coughing up mucus.   Body aches.   Chest congestion.   Chills.   Shortness of breath.   Sore throat.  DIAGNOSIS  Acute bronchitis is usually diagnosed through a physical exam. Your health care provider will also ask you questions about your medical history. Tests, such as chest X-rays, are sometimes done to rule out other conditions.  TREATMENT  Acute bronchitis usually goes away in a couple weeks. Oftentimes, no medical treatment is necessary. Medicines are sometimes given for relief of fever or cough. Antibiotic medicines are usually not needed but may be prescribed in certain situations. In some cases, an inhaler may be recommended to help reduce shortness of breath and control the cough. A cool mist vaporizer may also be used to help thin bronchial secretions and make it easier to clear the chest.  HOME CARE INSTRUCTIONS  Get plenty of rest.   Drink enough fluids to keep your urine clear or pale yellow (unless you have a medical condition that requires fluid restriction). Increasing fluids may help thin your respiratory secretions  (sputum) and reduce chest congestion, and it will prevent dehydration.   Take medicines only as directed by your health care provider.  If you were prescribed an antibiotic medicine, finish it all even if you start to feel better.  Avoid smoking and secondhand smoke. Exposure to cigarette smoke or irritating chemicals will make bronchitis worse. If you are a smoker, consider using nicotine gum or skin patches to help control withdrawal symptoms. Quitting smoking will help your lungs heal faster.   Reduce the chances of another bout of acute bronchitis by washing your hands frequently, avoiding people with cold symptoms, and trying not to touch your hands to your mouth, nose, or eyes.   Keep all follow-up visits as directed by your health care provider.  SEEK MEDICAL CARE IF: Your symptoms do not improve after 1 week of treatment.  SEEK IMMEDIATE MEDICAL CARE IF:  You develop an increased fever or chills.   You have chest pain.   You have severe shortness of breath.  You have bloody sputum.   You develop dehydration.  You faint or repeatedly feel like you are going to pass out.  You develop repeated vomiting.  You develop a severe headache. MAKE SURE YOU:   Understand these instructions.  Will watch your condition.  Will get help right away if you are not doing well or get worse. Document Released: 01/18/2005 Document Revised: 04/27/2014 Document Reviewed: 06/03/2013 [facility] Patient Information 2015 [place], [place]. This information is not intended to replace advice given to you by your health care provider. Make sure you discuss any questions you have with your health care provider.

## 2015-06-30 NOTE — ED Notes (Signed)
Pt presents to department for evaluation of SOB, dry cough and dizziness. Pt states "I think I have fluid in my lungs." history of CHF and afib. Respirations unlabored. Pt is alert and oriented x4.

## 2015-06-30 NOTE — ED Provider Notes (Signed)
CSN: [id number]     Arrival date & time 06/30/15  1413 History   First MD Initiated Contact with Patient 06/30/15 1515     Chief Complaint  Patient presents with  . Shortness of Breath  . Dizziness  . Cough     (Consider location/radiation/quality/duration/timing/severity/associated sxs/prior Treatment) HPI  Anetra L Yurick is a 74 y.o. female with history of asthma, hypertension, anemia, CHF, chronic bronchitis, diabetes, presents to emergency department complaining of cough and shortness of breath. Patient states symptoms started 1 week ago. She states she is coughing, productive of yellow sputum. She denies any fever or chills. She denies any sore throat or nasal congestion. She denies any chest pain. She states she is short of breath, mainly with laying down flat and with walking and exertion. She denies increased swelling in her legs, states "they're always swollen but most of the time they're more swollen than this." Patient states she went to her primary care doctor yesterday who put on antibiotics and given a breathing treatment. Patient states that she was told that her lungs are "full of fluid." She denies having a chest x-ray done yesterday. She states that today she continued to have cough and shortness of breath and her family became concerned with what her doctor told her yesterday and brought her to emergency department for evaluation. Patient states she was taking all the medications that she was prescribed yesterday by her doctor.    Past Medical History  Diagnosis Date  . Osteopenia   . Vitamin D deficiency disease   . Irregular heart beat   . Urge incontinence   . Asthma   . Hypertension   . Anemia   . Hyperlipidemia   . GERD (gastroesophageal reflux disease)   . Vertigo, benign positional   . Sleep apnea   . CHF (congestive heart failure)   . Breast cancer 2007    left  . Chronic bronchitis   . Exertional shortness of breath     "most of the time" (06/02/2013)  .  Type II diabetes mellitus   . History of blood transfusion 2007    "related to breast cancer" (06/02/2013)  . H/O hiatal hernia   . Migraines     "years ago" (06/02/2013)  . Arthritis     "all over" (06/02/2013)  . Chronic lower back pain   . Pneumonia     "walking pneumonia once; hospitalized 2nd time I had pneumonia" (06/02/2013)  . On home oxygen therapy     "2L at night when I go to bed" (06/03/2103)  . Distal radial fracture 05/26/2013    right "fell" (06/02/2013)   Past Surgical History  Procedure Laterality Date  . Tubal ligation    . Medial partial knee replacement Left   . Total knee arthroplasty Bilateral   . Knee arthroscopy Left   . Joint replacement    . Breast lumpectomy Left 2007  . Breast biopsy Left 2007  . Cataract extraction w/ intraocular lens  implant, bilateral    . Cardiac catheterization  07/2012  . Left heart catheterization with coronary angiogram Right 08/02/2012    Procedure: LEFT HEART CATHETERIZATION WITH CORONARY ANGIOGRAM;  Surgeon: Birdie Riddle, MD;  Location: [facility];  Service: Cardiovascular;  Laterality: Right;   Family History  Problem Relation Age of Onset  . Cancer Sister     brain tumor  . Stroke Father   . Kidney disease Mother   . Emphysema Brother    History  Substance Use Topics  . Smoking status: Former Smoker    Types: Cigarettes  . Smokeless tobacco: Never Used     Comment: 06/02/2013 "quit smoking in the 1950's"  . Alcohol Use: Yes     Comment: 06/02/2013 "quit drinking ~ 35 yr ago; never had problem w/it"   OB History    Gravida Para Term Preterm AB TAB SAB Ectopic Multiple Living   4 4 4       4      Review of Systems  Constitutional: Negative for fever and chills.  HENT: Negative.   Respiratory: Positive for cough and shortness of breath. Negative for chest tightness.   Cardiovascular: Positive for leg swelling. Negative for chest pain and palpitations.  Gastrointestinal: Negative for nausea, vomiting, abdominal pain and  diarrhea.  Musculoskeletal: Negative for myalgias, arthralgias, neck pain and neck stiffness.  Skin: Negative for rash.  Neurological: Negative for dizziness, weakness and headaches.  All other systems reviewed and are negative.     Allergies  Codeine and Sulfa antibiotics  Home Medications   Prior to Admission medications   Medication Sig Start Date End Date Taking? Authorizing Provider  albuterol (PROVENTIL) (5 MG/ML) 0.5% nebulizer solution Take 2.5 mg by nebulization every 6 (six) hours as needed for wheezing or shortness of breath.     Historical Provider, MD  albuterol-ipratropium (COMBIVENT) 18-103 MCG/ACT inhaler Inhale 2 puffs into the lungs every 6 (six) hours as needed for wheezing or shortness of breath.     Historical Provider, MD  aspirin EC 81 MG EC tablet Take 1 tablet (81 mg total) by mouth daily. 06/09/13   Dixie Dials, MD  cholecalciferol (VITAMIN D) 1000 UNITS tablet Take 1,000 Units by mouth daily.    Historical Provider, MD  ciprofloxacin (CIPRO) 500 MG tablet Take 1 tablet (500 mg total) by mouth 2 (two) times daily. 06/09/13   Dixie Dials, MD  diazepam (VALIUM) 5 MG tablet Take 5 mg by mouth at bedtime as needed for anxiety (for muscle relaxation).    Historical Provider, MD  diltiazem (TIAZAC) 240 MG 24 hr capsule Take 240 mg by mouth daily.    Historical Provider, MD  docusate sodium 100 MG CAPS Take 100 mg by mouth 2 (two) times daily. 06/09/13   Dixie Dials, MD  furosemide (LASIX) 20 MG tablet Take 20 mg by mouth daily.     Historical Provider, MD  ipratropium-albuterol (DUONEB) 0.5-2.5 (3) MG/3ML SOLN Take 3 mLs by nebulization every 6 (six) hours as needed (wheezing/shortness of breath).    Historical Provider, MD  lisinopril (PRINIVIL,ZESTRIL) 2.5 MG tablet Take 2.5 mg by mouth daily.    Historical Provider, MD  meclizine (ANTIVERT) 25 MG tablet Take 25 mg by mouth every 6 (six) hours as needed for dizziness (for vertigo).    Historical Provider, MD   metFORMIN (GLUCOPHAGE) 500 MG tablet Take 500 mg by mouth 2 (two) times daily with a meal.    Historical Provider, MD  metoprolol tartrate (LOPRESSOR) 25 MG tablet Take 25 mg by mouth 2 (two) times daily.    Historical Provider, MD  montelukast (SINGULAIR) 10 MG tablet Take 10 mg by mouth every morning.     Historical Provider, MD  omeprazole (PRILOSEC) 20 MG capsule Take 20 mg by mouth daily.    Historical Provider, MD  ondansetron (ZOFRAN) 4 MG tablet Take 4 mg by mouth every 8 (eight) hours as needed for nausea (take if pain medication causes nausea).    Historical Provider, MD  oxyCODONE-acetaminophen (PERCOCET/ROXICET) 5-325 MG per tablet Take 1 tablet by mouth every 4 (four) hours as needed for pain (for pain).    Historical Provider, MD  potassium chloride SA (K-DUR,KLOR-CON) 20 MEQ tablet Take 20 mEq by mouth daily.    Historical Provider, MD  promethazine (PHENERGAN) 25 MG tablet Take 25 mg by mouth every 6 (six) hours as needed for nausea (for nausea and vomiting).    Historical Provider, MD  simvastatin (ZOCOR) 40 MG tablet Take 40 mg by mouth every other day.     Historical Provider, MD   BP 124/107 mmHg  Pulse 80  Temp(Src) 97.9 F (36.6 C) (Oral)  Resp 25  Ht 5\' 2"  (1.575 m)  Wt 221 lb (100.245 kg)  BMI 40.41 kg/m2  SpO2 99% Physical Exam  Constitutional: She is oriented to person, place, and time. She appears well-developed and well-nourished. No distress.  HENT:  Head: Normocephalic.  Right Ear: External ear normal.  Left Ear: External ear normal.  Nose: Nose normal.  Mouth/Throat: Oropharynx is clear and moist.  Eyes: Conjunctivae are normal.  Neck: Normal range of motion. Neck supple.  Cardiovascular: Normal rate, regular rhythm and normal heart sounds.   Pulmonary/Chest: Effort normal and breath sounds normal. No respiratory distress. She has no wheezes. She has no rales.  Abdominal: Soft. Bowel sounds are normal. She exhibits no distension. There is no tenderness.  There is no rebound.  Musculoskeletal: She exhibits edema.  1+ edema bilaterally.  Neurological: She is alert and oriented to person, place, and time.  Skin: Skin is warm and dry.  Psychiatric: She has a normal mood and affect. Her behavior is normal.  Nursing note and vitals reviewed.   ED Course  Procedures (including critical care time) Labs Review Labs Reviewed  CBC WITH DIFFERENTIAL/PLATELET - Abnormal; Notable for the following:    Hemoglobin 10.9 (*)    HCT 34.7 (*)    RDW 16.7 (*)    All other components within normal limits  COMPREHENSIVE METABOLIC PANEL - Abnormal; Notable for the following:    Glucose, Bld 105 (*)    Creatinine, Ser 1.18 (*)    Calcium 8.8 (*)    Albumin 3.1 (*)    ALT 13 (*)    GFR calc non Af Amer 45 (*)    GFR calc Af Amer 52 (*)    All other components within normal limits  BRAIN NATRIURETIC PEPTIDE  I-STAT TROPOININ, ED    Imaging Review Dg Chest 2 View  06/30/2015   CLINICAL DATA:  74 year old female with productive cough and shortness of breath stop  EXAM: CHEST  2 VIEW  COMPARISON:  CT dated 06/10/2014  FINDINGS: The heart size and mediastinal contours are within normal limits. Both lungs are clear. The visualized skeletal structures are unremarkable. Left axillary surgical clips.  IMPRESSION: No active cardiopulmonary disease.   Electronically Signed   By: Anner Crete M.D.   On: 06/30/2015 14:58     EKG Interpretation   Date/Time:  Wednesday June 30 2015 14:23:22 EDT Ventricular Rate:  80 PR Interval:    QRS Duration: 74 QT Interval:  386 QTC Calculation: 445 R Axis:   60 Text Interpretation:  Atrial fibrillation Low voltage QRS No significant  change since last tracing Confirmed by Ashok Cordia  MD, Lennette Bihari ([id number]) on  06/30/2015 3:49:03 PM      MDM   Final diagnoses:  Bronchitis    Patient in emergency department with cough, productive, yellow sputum. Also reports shortness  of breath worse with exertion and laying down flat.  History of asthma, chronic bronchitis, CHF. She came to emergency department after becoming concerned that her doctor told her that her lungs were filled with fluid yesterday. She was placed on antibiotics yesterday and was given a breathing treatment in the office. She has had 2 doses of antibiotics now. Her vital signs are normal at this time. Will try a breathing treatment. Her lungs are clear. Will monitor until blood work and chest x-ray results.  4:55 PM BNP appears to be at baseline, labs otherwise unremarkable. Vital signs are normal. She has oxygen saturation of 99% on room air. Most likely symptoms due to acute on chronic bronchitis. Will add a short prednisone taper. Continue azithromycin at home. Follow-up with primary care doctor. Discussed with Dr. Ashok Cordia who agrees.  Filed Vitals:   06/30/15 1615 06/30/15 1630 06/30/15 1646 06/30/15 1700  BP: 99/57 108/73 108/44   Pulse: 74  79   Temp:    98.7 F (37.1 C)  TempSrc:      Resp: 21 23 23    Height:      Weight:      SpO2: 98%  99%      Jeannett Senior, PA-C 07/01/15 0028  Lajean Saver, MD 07/05/15 [phone]

## 2015-07-01 ENCOUNTER — Other Ambulatory Visit: Payer: Self-pay | Admitting: Internal Medicine

## 2015-07-01 DIAGNOSIS — R5381 Other malaise: Secondary | ICD-10-CM

## 2015-07-05 ENCOUNTER — Other Ambulatory Visit: Payer: Self-pay | Admitting: Internal Medicine

## 2015-07-05 DIAGNOSIS — E2839 Other primary ovarian failure: Secondary | ICD-10-CM

## 2015-07-14 ENCOUNTER — Other Ambulatory Visit: Payer: Self-pay | Admitting: Internal Medicine

## 2015-07-14 ENCOUNTER — Ambulatory Visit
Admission: RE | Admit: 2015-07-14 | Discharge: 2015-07-14 | Disposition: A | Discharge status: Home / Self Care | Payer: Medicare Other | Source: Ambulatory Visit | Attending: Internal Medicine | Admitting: Internal Medicine

## 2015-07-14 DIAGNOSIS — Z1231 Encounter for screening mammogram for malignant neoplasm of breast: Secondary | ICD-10-CM

## 2015-07-14 DIAGNOSIS — Z853 Personal history of malignant neoplasm of breast: Secondary | ICD-10-CM

## 2015-07-14 DIAGNOSIS — E2839 Other primary ovarian failure: Secondary | ICD-10-CM

## 2015-08-19 ENCOUNTER — Encounter: Payer: Self-pay | Admitting: Internal Medicine

## 2016-01-17 ENCOUNTER — Other Ambulatory Visit: Payer: Self-pay | Admitting: Physician Assistant

## 2016-01-17 DIAGNOSIS — R1011 Right upper quadrant pain: Secondary | ICD-10-CM

## 2016-01-21 ENCOUNTER — Ambulatory Visit
Admission: RE | Admit: 2016-01-21 | Discharge: 2016-01-21 | Disposition: A | Discharge status: Home / Self Care | Payer: Medicare Other | Source: Ambulatory Visit | Attending: Physician Assistant | Admitting: Physician Assistant

## 2016-01-21 DIAGNOSIS — R1011 Right upper quadrant pain: Secondary | ICD-10-CM

## 2016-03-07 ENCOUNTER — Ambulatory Visit: Payer: Medicare Other | Attending: Internal Medicine

## 2016-08-17 ENCOUNTER — Institutional Professional Consult (permissible substitution): Payer: Medicare Other | Admitting: Pulmonary Disease

## 2016-09-14 ENCOUNTER — Other Ambulatory Visit: Payer: Self-pay | Admitting: Internal Medicine

## 2016-09-14 DIAGNOSIS — Z1231 Encounter for screening mammogram for malignant neoplasm of breast: Secondary | ICD-10-CM

## 2016-10-16 ENCOUNTER — Ambulatory Visit
Admission: RE | Admit: 2016-10-16 | Discharge: 2016-10-16 | Disposition: A | Discharge status: Home / Self Care | Payer: Medicare Other | Source: Ambulatory Visit | Attending: Internal Medicine | Admitting: Internal Medicine

## 2016-10-16 DIAGNOSIS — Z1231 Encounter for screening mammogram for malignant neoplasm of breast: Secondary | ICD-10-CM

## 2017-01-01 ENCOUNTER — Encounter (HOSPITAL_COMMUNITY): Payer: Self-pay

## 2017-01-01 ENCOUNTER — Emergency Department (HOSPITAL_COMMUNITY): Payer: Medicare Other

## 2017-01-01 ENCOUNTER — Emergency Department (HOSPITAL_COMMUNITY)
Admission: EM | Admit: 2017-01-01 | Discharge: 2017-01-01 | Disposition: A | Discharge status: Home / Self Care | Payer: Medicare Other | Attending: Emergency Medicine | Admitting: Emergency Medicine

## 2017-01-01 DIAGNOSIS — Z853 Personal history of malignant neoplasm of breast: Secondary | ICD-10-CM | POA: Diagnosis not present

## 2017-01-01 DIAGNOSIS — Z96653 Presence of artificial knee joint, bilateral: Secondary | ICD-10-CM | POA: Diagnosis not present

## 2017-01-01 DIAGNOSIS — I110 Hypertensive heart disease with heart failure: Secondary | ICD-10-CM | POA: Insufficient documentation

## 2017-01-01 DIAGNOSIS — E119 Type 2 diabetes mellitus without complications: Secondary | ICD-10-CM | POA: Diagnosis not present

## 2017-01-01 DIAGNOSIS — Z7984 Long term (current) use of oral hypoglycemic drugs: Secondary | ICD-10-CM | POA: Insufficient documentation

## 2017-01-01 DIAGNOSIS — Z87891 Personal history of nicotine dependence: Secondary | ICD-10-CM | POA: Diagnosis not present

## 2017-01-01 DIAGNOSIS — J45909 Unspecified asthma, uncomplicated: Secondary | ICD-10-CM | POA: Diagnosis present

## 2017-01-01 DIAGNOSIS — J45901 Unspecified asthma with (acute) exacerbation: Secondary | ICD-10-CM | POA: Diagnosis not present

## 2017-01-01 DIAGNOSIS — I509 Heart failure, unspecified: Secondary | ICD-10-CM | POA: Diagnosis not present

## 2017-01-01 DIAGNOSIS — Z7982 Long term (current) use of aspirin: Secondary | ICD-10-CM | POA: Diagnosis not present

## 2017-01-01 LAB — I-STAT CHEM 8, ED
BUN: 18 mg/dL (ref 6–20)
Calcium, Ion: 1.21 mmol/L (ref 1.15–1.40)
Chloride: 104 mmol/L (ref 101–111)
Creatinine, Ser: 0.9 mg/dL (ref 0.44–1.00)
GLUCOSE: 113 mg/dL — AB (ref 65–99)
HCT: 38 % (ref 36.0–46.0)
HEMOGLOBIN: 12.9 g/dL (ref 12.0–15.0)
POTASSIUM: 5.2 mmol/L — AB (ref 3.5–5.1)
Sodium: 141 mmol/L (ref 135–145)
TCO2: 30 mmol/L (ref 0–100)

## 2017-01-01 LAB — BRAIN NATRIURETIC PEPTIDE: B NATRIURETIC PEPTIDE 5: 340.6 pg/mL — AB (ref 0.0–100.0)

## 2017-01-01 MED ORDER — ALBUTEROL SULFATE (2.5 MG/3ML) 0.083% IN NEBU
5.0000 mg | INHALATION_SOLUTION | Freq: Once | RESPIRATORY_TRACT | Status: AC
  Administered 2017-01-01: 5 mg via RESPIRATORY_TRACT

## 2017-01-01 MED ORDER — ALBUTEROL SULFATE HFA 108 (90 BASE) MCG/ACT IN AERS: 1.0000 | INHALATION_SPRAY | Freq: Four times a day (QID) | RESPIRATORY_TRACT | 0 refills | Status: DC | PRN

## 2017-01-01 MED ORDER — IPRATROPIUM BROMIDE 0.02 % IN SOLN
0.5000 mg | Freq: Once | RESPIRATORY_TRACT | Status: AC
  Administered 2017-01-01: 0.5 mg via RESPIRATORY_TRACT

## 2017-01-01 MED ORDER — PREDNISONE 20 MG PO TABS
50.0000 mg | ORAL_TABLET | Freq: Once | ORAL | Status: AC
  Administered 2017-01-01: 50 mg via ORAL
  Filled 2017-01-01: qty 3

## 2017-01-01 MED ORDER — PREDNISONE 50 MG PO TABS: 50.0000 mg | ORAL_TABLET | Freq: Every day | ORAL | 0 refills | Status: DC

## 2017-01-01 MED ORDER — IPRATROPIUM BROMIDE 0.02 % IN SOLN
RESPIRATORY_TRACT | Status: AC
  Filled 2017-01-01: qty 2.5

## 2017-01-01 MED ORDER — ALBUTEROL SULFATE (2.5 MG/3ML) 0.083% IN NEBU
INHALATION_SOLUTION | RESPIRATORY_TRACT | Status: AC
  Filled 2017-01-01: qty 6

## 2017-01-01 NOTE — ED Notes (Signed)
Pt ambulatory from TR 01 to lobby with family member at side with steady gait noted

## 2017-01-01 NOTE — Discharge Instructions (Addendum)
YOU POTASSIUM WAS ELEVATED TODAY (5.2) AS WELL AS YOUR BNP; CALL YOUR PRIMARY CARE DOCTOR TO HAVE THESE RECHECKED THIS WEEK WITHOUT FAIL. Take Prednisone (steroid) as prescribed--take with food to prevent GI upset. Check your blood sugars more frequently as this medication may increase your blood sugar--return to ER if you experience persistent elevated blood sugars. Use albuterol inhaler as prescribed. Return to ER if you experience fevers, chills, unexplained weight loss, dizziness, chest pain, persistent/worsening shortness of breath, abdominal pain, nausea/vomiting/diarrhea, worsening symptoms, or any additional concerns.

## 2017-01-01 NOTE — ED Notes (Signed)
Restrictions band (pink) placed on pt's left wrist. 

## 2017-01-01 NOTE — ED Provider Notes (Signed)
Medical screening examination/treatment/procedure(s) were conducted as a shared visit with non-physician practitioner(s) and myself.  I personally evaluated the patient during the encounter.  Significant history of CHF however is here today was using an asthma exacerbation. On my exam she has slightly decreased breath sounds in bilateral lower bases. No crackles. Chest x-ray showed possibly mild pulmonary edema but no pleural effusions. Plan to treat as asthma exacerbation. Patient feels better after first breathing treatment so will give another and some steroids.       Merrily Pew, MD 01/02/17 1251

## 2017-01-01 NOTE — ED Triage Notes (Signed)
Pt reports she feels like she is having an asthma attack that started yesterday evening. She used her home nebulizer this morning without relief. Pt talking in clear complete sentences.

## 2017-01-01 NOTE — ED Provider Notes (Signed)
[facility] DEPT Provider Note   CSN: [id number] Arrival date & time: 01/01/17  1142     History   Chief Complaint Chief Complaint  Patient presents with  . Asthma    HPI Lisa Walters is a 76 y.o. female.  Pt is a 76 y/o F with PMH of CHF, asthma, gerd, anemia, htn, hld, and afib (on coumadin) who presents to ED for shortness of breath, wheezing, and productive cough with yellow sputum, onset yesterday, reports feels similar to previous asthma exacerbations. Denies previous intubation or hospitalization for asthma. Denies recent travel or known sick contacts. Denies fevers, chills, unexplained weight loss, dizziness, vision or gait changes, Cp, hemoptysis, abd pain, n/v/d, dysuria, hematuria, extremity pain/swelling, or any additional concerns.    The history is provided by the patient. No language interpreter was used.    Past Medical History:  Diagnosis Date  . Anemia   . Arthritis    "all over" (06/02/2013)  . Asthma   . Breast cancer ([place]) 2007   left  . CHF (congestive heart failure) ([place])   . Chronic bronchitis ([place])   . Chronic lower back pain   . Distal radial fracture 05/26/2013   right "fell" (06/02/2013)  . Exertional shortness of breath    "most of the time" (06/02/2013)  . GERD (gastroesophageal reflux disease)   . H/O hiatal hernia   . History of blood transfusion 2007   "related to breast cancer" (06/02/2013)  . Hyperlipidemia   . Hypertension   . Irregular heart beat   . Migraines    "years ago" (06/02/2013)  . On home oxygen therapy    "2L at night when I go to bed" (06/03/2103)  . Osteopenia   . Pneumonia    "walking pneumonia once; hospitalized 2nd time I had pneumonia" (06/02/2013)  . Sleep apnea   . Type II diabetes mellitus ([place])   . Urge incontinence   . Vertigo, benign positional   . Vitamin D deficiency disease     Patient Active Problem List   Diagnosis Date Noted  . Osteopenia   . Vitamin D deficiency disease   . Breast cancer ([place])   .  Irregular heart beat     Past Surgical History:  Procedure Laterality Date  . BREAST BIOPSY Left 2007  . BREAST LUMPECTOMY Left 2007  . CARDIAC CATHETERIZATION  07/2012  . CATARACT EXTRACTION W/ INTRAOCULAR LENS  IMPLANT, BILATERAL    . JOINT REPLACEMENT    . KNEE ARTHROSCOPY Left   . LEFT HEART CATHETERIZATION WITH CORONARY ANGIOGRAM Right 08/02/2012   Procedure: LEFT HEART CATHETERIZATION WITH CORONARY ANGIOGRAM;  Surgeon: Birdie Riddle, MD;  Location: [facility] CATH LAB;  Service: Cardiovascular;  Laterality: Right;  . MEDIAL PARTIAL KNEE REPLACEMENT Left   . TOTAL KNEE ARTHROPLASTY Bilateral   . TUBAL LIGATION      OB History    Gravida Para Term Preterm AB Living   4 4 4     4    SAB TAB Ectopic Multiple Live Births                   Home Medications    Prior to Admission medications   Medication Sig Start Date End Date Taking? Authorizing Provider  albuterol (PROVENTIL) (5 MG/ML) 0.5% nebulizer solution Take 2.5 mg by nebulization every 6 (six) hours as needed for wheezing or shortness of breath.     Historical Provider, MD  albuterol-ipratropium (COMBIVENT) 18-103 MCG/ACT inhaler Inhale 2 puffs into the  lungs every 6 (six) hours as needed for wheezing or shortness of breath.     Historical Provider, MD  aspirin EC 81 MG EC tablet Take 1 tablet (81 mg total) by mouth daily. 06/09/13   Dixie Dials, MD  cholecalciferol (VITAMIN D) 1000 UNITS tablet Take 1,000 Units by mouth daily.    Historical Provider, MD  ciprofloxacin (CIPRO) 500 MG tablet Take 1 tablet (500 mg total) by mouth 2 (two) times daily. Patient not taking: Reported on 06/30/2015 06/09/13   Dixie Dials, MD  diazepam (VALIUM) 5 MG tablet Take 5 mg by mouth at bedtime as needed for anxiety (for muscle relaxation).    Historical Provider, MD  diltiazem (TIAZAC) 240 MG 24 hr capsule Take 240 mg by mouth daily.    Historical Provider, MD  docusate sodium 100 MG CAPS Take 100 mg by mouth 2 (two) times daily. 06/09/13   Dixie Dials, MD  fluticasone (FLONASE) 50 MCG/ACT nasal spray Place 1 spray into both nostrils daily as needed. congestion 06/29/15   Historical Provider, MD  furosemide (LASIX) 20 MG tablet Take 20 mg by mouth daily.     Historical Provider, MD  ipratropium-albuterol (DUONEB) 0.5-2.5 (3) MG/3ML SOLN Take 3 mLs by nebulization every 6 (six) hours as needed (wheezing/shortness of breath).    Historical Provider, MD  lisinopril (PRINIVIL,ZESTRIL) 2.5 MG tablet Take 2.5 mg by mouth daily.    Historical Provider, MD  meclizine (ANTIVERT) 25 MG tablet Take 25 mg by mouth every 6 (six) hours as needed for dizziness (for vertigo).    Historical Provider, MD  metFORMIN (GLUCOPHAGE) 500 MG tablet Take 500 mg by mouth 2 (two) times daily with a meal.    Historical Provider, MD  metoprolol succinate (TOPROL-XL) 25 MG 24 hr tablet Take 25 mg by mouth daily. 06/25/15   Historical Provider, MD  montelukast (SINGULAIR) 10 MG tablet Take 10 mg by mouth every morning.     Historical Provider, MD  omeprazole (PRILOSEC) 20 MG capsule Take 20 mg by mouth daily.    Historical Provider, MD  ondansetron (ZOFRAN) 4 MG tablet Take 4 mg by mouth every 8 (eight) hours as needed for nausea (take if pain medication causes nausea).    Historical Provider, MD  potassium chloride SA (K-DUR,KLOR-CON) 20 MEQ tablet Take 20 mEq by mouth daily.    Historical Provider, MD  predniSONE (DELTASONE) 20 MG tablet Take 2 tablets (40 mg total) by mouth daily. 06/30/15   Tatyana Kirichenko, PA-C  simvastatin (ZOCOR) 40 MG tablet Take 40 mg by mouth every other day.     Historical Provider, MD  SYMBICORT 160-4.5 MCG/ACT inhaler Inhale 1 puff into the lungs every 6 (six) hours as needed. wheezing 05/31/15   Historical Provider, MD  warfarin (COUMADIN) 5 MG tablet Take 5 mg by mouth daily. 06/09/15   Historical Provider, MD    Family History Family History  Problem Relation Age of Onset  . Stroke Father   . Emphysema Brother   . Cancer Sister     brain  tumor  . Kidney disease Mother     Social History Social History  Substance Use Topics  . Smoking status: Former Smoker    Types: Cigarettes  . Smokeless tobacco: Never Used     Comment: 06/02/2013 "quit smoking in the 1950's"  . Alcohol use Yes     Comment: 06/02/2013 "quit drinking ~ 35 yr ago; never had problem w/it"     Allergies   Codeine and  Sulfa antibiotics   Review of Systems Review of Systems  Constitutional: Negative for chills, fever and unexpected weight change.  HENT: Negative for congestion, ear pain and sore throat.   Respiratory: Positive for cough, shortness of breath and wheezing.   Cardiovascular: Negative for chest pain, palpitations and leg swelling.  Gastrointestinal: Negative for abdominal pain, diarrhea, nausea and vomiting.  Genitourinary: Negative for dysuria and hematuria.  Musculoskeletal: Negative for back pain, neck pain and neck stiffness.  Skin: Negative for rash.  Neurological: Negative for dizziness, weakness, numbness and headaches.  All other systems reviewed and are negative.    Physical Exam Updated Vital Signs BP 136/83 (BP Location: Right Arm)   Pulse 91   Temp 97.8 F (36.6 C) (Oral)   Resp 16   Ht 5\' 2"  (1.575 m)   Wt 100.2 kg   SpO2 99%   BMI 40.42 kg/m   Physical Exam  Constitutional: She is oriented to person, place, and time. She appears well-developed and well-nourished.  HENT:  Head: Normocephalic and atraumatic.  Right Ear: Tympanic membrane, external ear and ear canal normal.  Left Ear: Tympanic membrane, external ear and ear canal normal.  Nose: Nose normal. Right sinus exhibits no maxillary sinus tenderness and no frontal sinus tenderness. Left sinus exhibits no maxillary sinus tenderness and no frontal sinus tenderness.  Mouth/Throat: Uvula is midline, oropharynx is clear and moist and mucous membranes are normal. No trismus in the jaw. No oropharyngeal exudate. No tonsillar exudate.  Eyes: Conjunctivae and EOM  are normal. Pupils are equal, round, and reactive to light.  Neck: Normal range of motion. Neck supple. No JVD present.  Cardiovascular: Normal rate, regular rhythm, normal heart sounds and intact distal pulses.   No murmur heard. Pulmonary/Chest: Effort normal. She has decreased breath sounds in the right lower field and the left lower field. She has no wheezes. She has no rhonchi. She has no rales.  Abdominal: Soft. Bowel sounds are normal. There is no tenderness.  Musculoskeletal: Normal range of motion.  Neurological: She is alert and oriented to person, place, and time.  Skin: Skin is warm and dry.  Nursing note and vitals reviewed.    ED Treatments / Results  Labs (all labs ordered are listed, but only abnormal results are displayed) Labs Reviewed - No data to display  EKG  EKG Interpretation None       Radiology Dg Chest 2 View  Result Date: 01/01/2017 CLINICAL DATA:  Cough and shortness breath beginning yesterday. EXAM: CHEST  2 VIEW COMPARISON:  Two-view chest x-ray 07/02/2015. CT of the chest 06/10/2014 FINDINGS: Heart size is exaggerated by low lung volumes. IA double large is noted. Atherosclerotic calcifications are present. Mild pulmonary vascular congestion is present without frank edema. There are no effusions. No focal airspace disease is present. Surgical clips are again noted in the left axilla. The visualized soft tissues and bony thorax are otherwise unremarkable. IMPRESSION: 1. Low lung volumes and mild pulmonary vascular congestion without frank edema. 2. Duplicated aortic arch peer 3. Aortic atherosclerosis. Electronically Signed   By: San Morelle M.D.   On: 01/01/2017 12:29    Procedures Procedures (including critical care time)  Medications Ordered in ED Medications  albuterol (PROVENTIL) (2.5 MG/3ML) 0.083% nebulizer solution (not administered)  albuterol (PROVENTIL) (2.5 MG/3ML) 0.083% nebulizer solution 5 mg (5 mg Nebulization Given 01/01/17  1156)     Initial Impression / Assessment and Plan / ED Course  I have reviewed the triage vital signs and  the nursing notes.  Pertinent labs & imaging results that were available during my care of the patient were reviewed by me and considered in my medical decision making (see chart for details).  Clinical Course    Pt is a 76 y/o F who presents to ED for cough, wheezing, and SOB, concern for asthma exacerbation. Will get BMP, BNP, and CXR for acute pulmonary process. Doubt CHF exacerbation at this time. Reports relief after first breathing treatment, will give another and re-eval.   5:06 PM On re-eval, increased aeration. Lungs CTAB. Pending labs  7:48 PM Pt updated on results, discussed importance of follow up for recheck of K and BNP; discussed discharge instructions, rx and safety, return precuations, and f/u; pt verbalizes understanding using verbal teachback and agrees with plan, denies any additional concerns.   Final Clinical Impressions(s) / ED Diagnoses   Final diagnoses:  None    New Prescriptions New Prescriptions   No medications on file     Ulice Bold, NP 01/02/17 [place], NP 01/02/17 [place], NP 01/02/17 0040    Merrily Pew, MD 01/02/17 1251

## 2017-07-26 ENCOUNTER — Other Ambulatory Visit (INDEPENDENT_AMBULATORY_CARE_PROVIDER_SITE_OTHER): Payer: Medicare Other

## 2017-07-26 ENCOUNTER — Ambulatory Visit (INDEPENDENT_AMBULATORY_CARE_PROVIDER_SITE_OTHER)
Admission: RE | Admit: 2017-07-26 | Discharge: 2017-07-26 | Disposition: A | Discharge status: Home / Self Care | Payer: Medicare Other | Source: Ambulatory Visit | Attending: Pulmonary Disease | Admitting: Pulmonary Disease

## 2017-07-26 ENCOUNTER — Ambulatory Visit (INDEPENDENT_AMBULATORY_CARE_PROVIDER_SITE_OTHER): Payer: Medicare Other | Admitting: Pulmonary Disease

## 2017-07-26 ENCOUNTER — Encounter: Payer: Self-pay | Admitting: Pulmonary Disease

## 2017-07-26 DIAGNOSIS — J45909 Unspecified asthma, uncomplicated: Secondary | ICD-10-CM | POA: Diagnosis not present

## 2017-07-26 DIAGNOSIS — J9601 Acute respiratory failure with hypoxia: Secondary | ICD-10-CM | POA: Diagnosis not present

## 2017-07-26 DIAGNOSIS — K219 Gastro-esophageal reflux disease without esophagitis: Secondary | ICD-10-CM | POA: Diagnosis not present

## 2017-07-26 DIAGNOSIS — G4733 Obstructive sleep apnea (adult) (pediatric): Secondary | ICD-10-CM | POA: Insufficient documentation

## 2017-07-26 DIAGNOSIS — I509 Heart failure, unspecified: Secondary | ICD-10-CM

## 2017-07-26 DIAGNOSIS — E1129 Type 2 diabetes mellitus with other diabetic kidney complication: Secondary | ICD-10-CM | POA: Insufficient documentation

## 2017-07-26 DIAGNOSIS — E785 Hyperlipidemia, unspecified: Secondary | ICD-10-CM | POA: Insufficient documentation

## 2017-07-26 DIAGNOSIS — J9611 Chronic respiratory failure with hypoxia: Secondary | ICD-10-CM | POA: Insufficient documentation

## 2017-07-26 DIAGNOSIS — I4891 Unspecified atrial fibrillation: Secondary | ICD-10-CM | POA: Insufficient documentation

## 2017-07-26 DIAGNOSIS — Z9989 Dependence on other enabling machines and devices: Secondary | ICD-10-CM

## 2017-07-26 DIAGNOSIS — I5032 Chronic diastolic (congestive) heart failure: Secondary | ICD-10-CM | POA: Insufficient documentation

## 2017-07-26 LAB — CBC WITH DIFFERENTIAL/PLATELET
Basophils Absolute: 0 10*3/uL (ref 0.0–0.1)
Basophils Relative: 0.7 % (ref 0.0–3.0)
EOS ABS: 0.1 10*3/uL (ref 0.0–0.7)
Eosinophils Relative: 2.9 % (ref 0.0–5.0)
HEMATOCRIT: 33.4 % — AB (ref 36.0–46.0)
Hemoglobin: 10.5 g/dL — ABNORMAL LOW (ref 12.0–15.0)
LYMPHS PCT: 25.2 % (ref 12.0–46.0)
Lymphs Abs: 1.3 10*3/uL (ref 0.7–4.0)
MCHC: 31.4 g/dL (ref 30.0–36.0)
MCV: 84.6 fl (ref 78.0–100.0)
MONO ABS: 0.3 10*3/uL (ref 0.1–1.0)
Monocytes Relative: 6.3 % (ref 3.0–12.0)
Neutro Abs: 3.3 10*3/uL (ref 1.4–7.7)
Neutrophils Relative %: 64.9 % (ref 43.0–77.0)
Platelets: 292 10*3/uL (ref 150.0–400.0)
RBC: 3.94 Mil/uL (ref 3.87–5.11)
RDW: 17.5 % — ABNORMAL HIGH (ref 11.5–15.5)
WBC: 5.1 10*3/uL (ref 4.0–10.5)

## 2017-07-26 LAB — BRAIN NATRIURETIC PEPTIDE: PRO B NATRI PEPTIDE: 597 pg/mL — AB (ref 0.0–100.0)

## 2017-07-26 MED ORDER — ALBUTEROL SULFATE HFA 108 (90 BASE) MCG/ACT IN AERS: 2.0000 | INHALATION_SPRAY | RESPIRATORY_TRACT | 3 refills | Status: DC | PRN

## 2017-07-26 MED ORDER — SPACER/AERO CHAMBER MOUTHPIECE MISC: 1.0000 | Freq: Two times a day (BID) | 0 refills | Status: DC

## 2017-07-26 MED ORDER — BUDESONIDE-FORMOTEROL FUMARATE 160-4.5 MCG/ACT IN AERO: 2.0000 | INHALATION_SPRAY | Freq: Two times a day (BID) | RESPIRATORY_TRACT | 0 refills | Status: AC

## 2017-07-26 MED ORDER — RANITIDINE HCL 150 MG PO TABS: 150.0000 mg | ORAL_TABLET | Freq: Every day | ORAL | 3 refills | Status: DC

## 2017-07-26 NOTE — Progress Notes (Signed)
Subjective:    Patient ID: Lisa Walters, female    DOB: [birth year], 76 y.o.   MRN: [id number]  HPI As per the patient's medical record from her primary care physician she has been on treatment with Singulair, pro-air, & Symbicort 160/4.5 as of January. Patient also with known obstructive sleep apnea. Of note patient was seen in the emergency room on 07/15/17 and found to have hypoxia with shortness of breath and after review of the patient appears as though this was attributed to the patient's atrial fibrillation. Patient was seen remotely in our office by Dr. Melvyn Novas. She reports she was having suddenly increased dyspnea and difficulty breathing that resulted in her going to the emergency department. She feels like her dypsnea has improved since she started on oxygen therapy. She is chronically on Lasix. She has had increased lower extremity edema recently. She reports she does have intermittent chest tightness. She also has occasional chest pain. She has been told previously that she had congestive heart failure. She reports no change in baseline wheezing. She reports she is compliant with her Symbicort. She uses her nebulizer sometimes 5 times daily and also in the middle of the night if she wakes up with dyspnea. She does feel it helps her dyspnea at the time. She does have known sleep apnea and is using her CPAP therapy. She reports she wakes up nearly every night with dyspnea. She denies any fever, chills, or sweats. She does have palpitations at times. She is seeing cardiology. She reports she does have reflux and morning brash water taste at times. She does have frequent sinus congestion with drainage and pressure. She reports she has had asthma since childhood. Her breathing became worse in her 38s. She reports no history of recurrent exacerbations.   Review of Systems No abdominal pain. Has constipation. No nausea or emesis. No dysuria or hematuria. A pertinent 14 point review of systems is negative  except as per the history of presenting illness.  Allergies  Allergen Reactions  . Codeine     Unknown  . Sulfa Antibiotics     Unknown    Current Outpatient Prescriptions on File Prior to Visit  Medication Sig Dispense Refill  . albuterol (PROVENTIL HFA;VENTOLIN HFA) 108 (90 Base) MCG/ACT inhaler Inhale 1-2 puffs into the lungs every 6 (six) hours as needed for wheezing or shortness of breath. 1 Inhaler 0  . albuterol (PROVENTIL) (5 MG/ML) 0.5% nebulizer solution Take 2.5 mg by nebulization every 6 (six) hours as needed for wheezing or shortness of breath.     Marland Kitchen albuterol-ipratropium (COMBIVENT) 18-103 MCG/ACT inhaler Inhale 2 puffs into the lungs every 6 (six) hours as needed for wheezing or shortness of breath.     . cholecalciferol (VITAMIN D) 1000 UNITS tablet Take 1,000 Units by mouth daily.    . diazepam (VALIUM) 5 MG tablet Take 5 mg by mouth at bedtime as needed for anxiety (for muscle relaxation).    Marland Kitchen ipratropium-albuterol (DUONEB) 0.5-2.5 (3) MG/3ML SOLN Take 3 mLs by nebulization every 6 (six) hours as needed (wheezing/shortness of breath).    Marland Kitchen lisinopril (PRINIVIL,ZESTRIL) 2.5 MG tablet Take 2.5 mg by mouth daily.    . meclizine (ANTIVERT) 25 MG tablet Take 25 mg by mouth every 6 (six) hours as needed for dizziness (for vertigo).    . metFORMIN (GLUCOPHAGE) 500 MG tablet Take 500 mg by mouth 2 (two) times daily with a meal.    . ondansetron (ZOFRAN) 4 MG tablet Take 4  mg by mouth every 8 (eight) hours as needed for nausea (take if pain medication causes nausea).    . SYMBICORT 160-4.5 MCG/ACT inhaler Inhale 1 puff into the lungs every 6 (six) hours as needed. wheezing     No current facility-administered medications on file prior to visit.     Past Medical History:  Diagnosis Date  . Anemia   . Arthritis    "all over" (06/02/2013)  . Asthma   . Breast cancer ([place]) 2007   left  . CHF (congestive heart failure) ([place])   . Chronic bronchitis ([place])   . Chronic lower back  pain   . Distal radial fracture 05/26/2013   right "fell" (06/02/2013)  . Exertional shortness of breath    "most of the time" (06/02/2013)  . GERD (gastroesophageal reflux disease)   . H/O hiatal hernia   . History of blood transfusion 2007   "related to breast cancer" (06/02/2013)  . Hyperlipidemia   . Hypertension   . Irregular heart beat   . Migraines    "years ago" (06/02/2013)  . On home oxygen therapy    "2L at night when I go to bed" (06/03/2103)  . Osteopenia   . Pneumonia    "walking pneumonia once; hospitalized 2nd time I had pneumonia" (06/02/2013)  . Sleep apnea   . Type II diabetes mellitus ([place])   . Urge incontinence   . Vertigo, benign positional   . Vitamin D deficiency disease     Past Surgical History:  Procedure Laterality Date  . BREAST BIOPSY Left 2007  . BREAST LUMPECTOMY Left 2007  . CARDIAC CATHETERIZATION  07/2012  . CATARACT EXTRACTION W/ INTRAOCULAR LENS  IMPLANT, BILATERAL    . JOINT REPLACEMENT    . KNEE ARTHROSCOPY Left   . LEFT HEART CATHETERIZATION WITH CORONARY ANGIOGRAM Right 08/02/2012   Procedure: LEFT HEART CATHETERIZATION WITH CORONARY ANGIOGRAM;  Surgeon: Birdie Riddle, MD;  Location: [facility] CATH LAB;  Service: Cardiovascular;  Laterality: Right;  . MEDIAL PARTIAL KNEE REPLACEMENT Left   . TOTAL KNEE ARTHROPLASTY Bilateral   . TUBAL LIGATION      Family History  Problem Relation Age of Onset  . Stroke Father   . Emphysema Brother   . Cancer Sister        brain tumor  . Kidney disease Mother   . Asthma Brother     Social History   Social History  . Marital status: Widowed    Spouse name: N/A  . Number of children: N/A  . Years of education: N/A   Social History Main Topics  . Smoking status: Former Smoker    Packs/day: 0.10    Years: 5.00    Types: Cigarettes    Quit date: 12/25/1960  . Smokeless tobacco: Never Used  . Alcohol use Yes     Comment: 06/02/2013 "quit drinking ~ 35 yr ago; never had problem w/it"  . Drug use: No  .  Sexual activity: No   Other Topics Concern  . None   Social History Narrative   [facility] (07/26/17):   Originally from [facility]. Has always lived in [place]. 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 No bird exposure. No mold exposure.       Objective:   Physical Exam BP 116/64 (BP Location: Right Arm, Patient Position: Sitting, Cuff Size: Normal)   Pulse 86   Ht 5\' 3"  (1.6 m)   Wt 207 lb 6.4 oz (  94.1 kg)   SpO2 91%   BMI 36.74 kg/m  General:  Awake. Alert. No acute distress. Obese. Integument:  Warm & dry. No rash on exposed skin. No bruising on exposed skin. Extremities:  No cyanosis or clubbing.  Lymphatics:  No appreciated cervical or supraclavicular lymphadenoapthy. HEENT:  Moist mucus membranes. No oral ulcers. No scleral injection or icterus. Poor dentition. PERRL. Cardiovascular:  Regular rate. Pitting edema to the knees. Unable to appreciate JVD given body habitus.  Pulmonary:  Slightly diminished breath sounds in the bases. Otherwise clear to auscultation. Symmetric chest wall expansion. No accessory muscle use on room air. Abdomen: Soft. Normal bowel sounds. Protuberant. Musculoskeletal:  Normal bulk and tone. Hand grip strength 5/5 bilaterally. No joint deformity or effusion appreciated. Neurological:  CN 2-12 grossly in tact. No meningismus. Moving all 4 extremities equally. Symmetric brachioradialis deep tendon reflexes. Psychiatric:  Mood and affect congruent. Speech normal rhythm, rate & tone.   6MWT 07/26/17:  Walked 3 laps / Baseline Sat 95% on RA / Nadir Sat 87% on RA (required 6 L/m with ambulation to maintain - max heart rate 106 bpm)  IMAGING CXR PA/LAT 01/01/17 (personally reviewed by me):  Low lung volumes. Cardiomegaly noted. Pulmonary vascular congestion noted. No frank opacity or mass appreciated. No pleural effusion. Mediastinum normal in contour.  CT CHEST W/ CONTRAST 06/10/14 (personally reviewed  by me):  No pleural effusion or thickening. No pericardial effusion. No mass appreciated. Subpleural nodularity in right lower lobe seemingly stable since 2012. No pathologic mediastinal adenopathy.  CARDIAC TTE (7/3/7):  LV normal in size with EF approximately 60%. No wall motion abnormality. Mild increase in LV wall thickness. LA & RA normal in size. RV normal in size. No aortic regurgitation. No mitral regurgitation. Pulmonic valve not well visualized. No significant tricuspid regurgitation. No pericardial effusion.  LABS 06/09/17 BNP: 273.5  04/27/17 BMP: 145/4.4/101/26/12/0.77/111/9.4    Assessment & Plan:  76 y.o. female with long-standing history of asthma. I reviewed her chest CT imaging a chest x-ray imaging available to me as noted above. There is no obvious parenchymal lung disease. I do not have any recent imaging or pulmonary function testing with which to gauge her lung function or any parenchymal abnormalities as a cause for her acute hypoxia. However, I suspect that she does have some decompensation of her underlying congestive heart failure. As such, I'm proceeding with a workup for her hypoxia and hopefully optimizing her regimen for treatment of her underlying asthma. I instructed the patient contact me if she had any new breathing problems or questions before her next appointment.  1. Asthma: Unclear severity. Checking full pulmonary function testing before next appointment. Continuing patient on Symbicort with spacer. Patient given prescription for Ventolin rescue inhaler. Also counseled on appropriate oral hygiene with Symbicort. Checking RAST panel today. 2. Congestive heart failure: Following with cardiology. Checking serum BNP & complete echocardiogram. Currently on Lasix. 3. OSA: Continuing on CPAP therapy. Patient reports excellent adherence. Plan for compliance download at next appointment depending upon results. 4. Acute hypoxic respiratory failure: Suspect secondary to  decompensated congestive heart failure in the setting of atrial fibrillation. Checking chest x-ray PA/LAT. Checking 6 minute walk test with oxygen titration before next appointment. Checking serum CBC. Continuing patient on oxygen at 6 L/m with ambulation. 5. GERD: Starting patient on Zantac 150 mg by mouth daily at bedtime. 6. Follow-up: Patient to return to clinic in 6 weeks or sooner if needed.  Sonia Baller Ashok Cordia, M.D. [facility] Pulmonary &  Critical Care Pager:  [phone] After 3pm or if no response, call [phone] 10:20 AM 07/26/17

## 2017-07-26 NOTE — Addendum Note (Signed)
Addended by: Lorretta Harp on: 07/26/2017 11:05 AM   Modules accepted: Orders

## 2017-07-26 NOTE — Addendum Note (Signed)
Addended by: Lorretta Harp on: 07/26/2017 10:52 AM   Modules accepted: Orders

## 2017-07-26 NOTE — Patient Instructions (Addendum)
   Call me if you have any new breathing problems or questions before your next appointment.  I'm keeping you on your Symbicort and nebulizer medication.  Use the spacer we are giving you today with your Symbicort - 2 puffs twice daily.  Remember to remove any dentures or partials you have before you use your Symbicort inhaler. Remember to brush your teeth & tongue after you use your Symbicort inhaler as well as rinse, gargle & spit to keep from getting thrush in your mouth or on your tongue (a white film).   I am sending in a prescription for an emergency/rescue albuterol inhaler to use as needed.  I'm starting you on Zantac to help with your heartburn. Make sure you take this every night.  Use your oxygen at 6 L/m whenever you are walking or exerting yourself  TESTS ORDERED: 1. Full PFTs before next appointment (with Xopenex) 2. 6MWT with oxygen titration before next appointment 3. Complete Echocardiogram  4. CXR PA/LAT today  5. Serum BNP, RAST Panel & CBC with differential today

## 2017-07-27 LAB — RESPIRATORY ALLERGY PROFILE REGION II ~~LOC~~
Allergen, Cedar tree, t12: <0.1 kU/L
Allergen, D pternoyssinus,d7: <0.1 kU/L
Allergen, Mouse Urine Protein, e78: <0.1 kU/L
Allergen, Oak,t7: <0.1 kU/L
Box Elder IgE: <0.1 kU/L
COCKROACH: 1.29 kU/L — AB
Cat Dander: <0.1 kU/L
D. farinae: <0.1 kU/L
Dog Dander: <0.1 kU/L
Elm IgE: <0.1 kU/L
IGE (IMMUNOGLOBULIN E), SERUM: 29 kU/L (ref <115)
Johnson Grass: <0.1 kU/L
Pecan/Hickory Tree IgE: <0.1 kU/L
Timothy Grass: 0.67 kU/L — ABNORMAL HIGH

## 2017-08-01 ENCOUNTER — Telehealth: Payer: Self-pay | Admitting: Pulmonary Disease

## 2017-08-01 DIAGNOSIS — J9601 Acute respiratory failure with hypoxia: Secondary | ICD-10-CM

## 2017-08-01 DIAGNOSIS — J45909 Unspecified asthma, uncomplicated: Secondary | ICD-10-CM

## 2017-08-02 ENCOUNTER — Telehealth: Payer: Self-pay | Admitting: Emergency Medicine

## 2017-08-02 NOTE — Telephone Encounter (Signed)
Order has been placed and sent to [place] I have called the pt and she is aware.

## 2017-08-02 NOTE — Telephone Encounter (Signed)
Libby spoke with Bethanne Ginger and informed them that pt was set up for oxygen in another state and needs [facility] here to provide pt with oxygen.  [place] states all we need to do is place an order. Pt was walked at last OV on 8/2. Another phone note was placed by patient and was sent to [facility]. Will close this message.

## 2017-08-02 NOTE — Telephone Encounter (Signed)
JN  Please Advise-  Pt called in and stating she needs an order to obtain a POC so that she can have oxygen for her Echo that is scheduled for tomorrow. You saw patient on 8/2 and a walk test was preformed but then you also wanted pt to return for a 6MW which is not until Sept. Please advise if ok to place order. According to [place] since pt was placed on oxygen from another state they would need an updated order from [place]

## 2017-08-02 NOTE — Telephone Encounter (Signed)
Yes go ahead and place an order for oxygen as well as a portable concentrator. However, she required 6 L/m so that will likely be too much for a concentrator to handle. J.

## 2017-08-02 NOTE — Telephone Encounter (Signed)
Patient called again stating she can not go to her appointment scheduled 08/03/2017 at 4:00 for echocardiogram due to her not having a portable oxygen tank. Patient stated she have one for home only. [phone]

## 2017-08-03 ENCOUNTER — Other Ambulatory Visit (HOSPITAL_COMMUNITY): Payer: Medicare Other

## 2017-08-06 ENCOUNTER — Telehealth: Payer: Self-pay | Admitting: Pulmonary Disease

## 2017-08-06 NOTE — Telephone Encounter (Signed)
Spoke with pt, states that she has not yet received her O2 through [place], was told that it could not be dispensed until JN signs the O2 order.  JN please advise when order is signed.  Thanks!

## 2017-08-07 NOTE — Telephone Encounter (Signed)
CMN was with Rodena Piety, not in [place] CMN obtained - will hold for Lanny Hurst to see if he can take it to [facility] in the hospital CMN placed on cork board at the [phone] extension until the above question is answered Will hold message in triage

## 2017-08-07 NOTE — Telephone Encounter (Signed)
I'm in the hospital working until the end of next week. You can send it over with Lanny Hurst if he's coming over. J.

## 2017-08-08 ENCOUNTER — Ambulatory Visit (HOSPITAL_COMMUNITY): Payer: Medicare Other

## 2017-08-08 ENCOUNTER — Telehealth: Payer: Self-pay | Admitting: Pulmonary Disease

## 2017-08-08 ENCOUNTER — Encounter (HOSPITAL_COMMUNITY): Payer: Self-pay

## 2017-08-08 NOTE — Telephone Encounter (Signed)
Called Dr. Matthias Hughs office to see if they have a copy of her most recent echo report. They will fax what they have to our main fax number.

## 2017-08-08 NOTE — Telephone Encounter (Signed)
Spoke with patient. She stated that she had an echo done about 3 weeks ago and she will not be doing another one so soon. Advised patient I would let JN know. Nothing else was needed at time of call.

## 2017-08-08 NOTE — Telephone Encounter (Signed)
Ok. Can we get a copy of that echo report. Her last echo that is documented in our system is from 2007. Thank you.

## 2017-08-09 NOTE — Telephone Encounter (Signed)
Received records from Dr. Matthias Hughs office but this did not include echo results. Called Dr. Matthias Hughs office and had results faxed to office, these results were from 2016. Called pt's Cardiologist, Dr. Einar Gip per pt, and they have more recent echo results from 06/2017, these are also being faxed to the front fax. Will await results.

## 2017-08-09 NOTE — Telephone Encounter (Signed)
Completed CMN was given back to [place]. Nothing further needed. Will sign off.

## 2017-08-13 NOTE — Telephone Encounter (Addendum)
I have looked in JN's cubby and did not see results.   Daneil Dan please advise on any update. Thanks.

## 2017-08-13 NOTE — Telephone Encounter (Signed)
This is a Dr. Ashok Cordia pt, I looked in his look-ats and did not see records.

## 2017-08-14 NOTE — Telephone Encounter (Signed)
Called and requested that these results be refaxed.  The office stated that they have already faxed these to [place] x 2.  She will have the office manager refax these again.

## 2017-08-16 ENCOUNTER — Telehealth: Payer: Self-pay | Admitting: Pulmonary Disease

## 2017-08-16 NOTE — Telephone Encounter (Signed)
Waiting on results to be refaxed to [facility]

## 2017-08-16 NOTE — Telephone Encounter (Signed)
Spoke with patient. She stated that she wanted to update her pharmacy to [facility] on [address] and delete [facility] completely from her chart. Advised patient that this has been done. Nothing else needed at time of call.

## 2017-08-17 NOTE — Telephone Encounter (Signed)
I have checked Lisa Walters's cubby and folder up front. It does not appear that results have been received as of yet.

## 2017-08-20 NOTE — Telephone Encounter (Signed)
Spoke with Dr Einar Gip office, states that they are sending this now to the main fax # Will send to [place] as [place] and to keep an eye out.  Please let triage know if not received by 3pm

## 2017-08-21 NOTE — Telephone Encounter (Signed)
Forms have been received. They have been placed in JN's cubby. Will route to him so that he is aware.

## 2017-08-22 NOTE — Telephone Encounter (Signed)
CARDIAC  TTE (07/11/17):  LV normal in size with mild concentric hypertrophy. Normal wall motion. EF 55-60%. Diastolic function could not be assessed due to atrial fibrillation. LA moderately to severely dilated & RA moderately dilated. RV borderline dilation with normal function. No aortic regurgitation. Trace mitral regurgitation. Mild tricuspid regurgitation. Trace pulmonic regurgitation. No pericardial effusion. Aortic root normal in size. Normal pulmonary artery size. Pulmonary artery systolic pressure 40 mmHg.

## 2017-08-23 NOTE — Telephone Encounter (Signed)
Noted Will sign off 

## 2017-08-24 ENCOUNTER — Telehealth: Payer: Self-pay | Admitting: Pulmonary Disease

## 2017-08-24 NOTE — Telephone Encounter (Signed)
PFT 06/08/17: FVC 0.95 L (45%) FEV1 0.72 L (45%) FEV1/FVC 0.76 FEF 25-75 0.57 L (33%)

## 2017-08-31 ENCOUNTER — Telehealth: Payer: Self-pay | Admitting: Pulmonary Disease

## 2017-08-31 NOTE — Telephone Encounter (Signed)
Spoke with patient. She is requesting more oxygen tanks from [place]. She stated that [facility] told her that she needed to contact the doctor for a order. She currently has one for home use and one for when she leaves home.  Per her chart, she is supposed to be on 6L during exertion. When I asked the patient what she had the tank on, she replied "2L while at home and 3L while away from home."  JN, please advise. Thanks!

## 2017-09-02 NOTE — Telephone Encounter (Signed)
She is scheduled for a 6MWT with oxygen titration later this month. Can this be done sooner? That will allow [place] to more appropriately determine how much oxygen she is requiring. But I do agree that she needs more than just 2 tanks. Ideally she should have 4 at home and 4 for use when traveling. Thanks.

## 2017-09-03 NOTE — Telephone Encounter (Signed)
Pt has been scheduled for [facility] on 09/04/17. Nothing further needed.

## 2017-09-03 NOTE — Telephone Encounter (Signed)
ATC pt and call was disconnected. Called back and received busy signal. Will call back.

## 2017-09-03 NOTE — Telephone Encounter (Signed)
Patient is returning phone call.  °

## 2017-09-04 ENCOUNTER — Telehealth: Payer: Self-pay | Admitting: Pulmonary Disease

## 2017-09-04 ENCOUNTER — Ambulatory Visit: Payer: Medicare Other | Admitting: Pulmonary Disease

## 2017-09-04 DIAGNOSIS — J9611 Chronic respiratory failure with hypoxia: Secondary | ICD-10-CM

## 2017-09-04 NOTE — Telephone Encounter (Signed)
Order placed for POC to [place]. Nothing further needed.

## 2017-09-04 NOTE — Telephone Encounter (Signed)
Pt came in today for an O2 titration, which showed she is sufficient on 2lpm with exertion.  Pt is requesting an order for a POC.  JN please advise if ok to order.  Thanks!

## 2017-09-04 NOTE — Telephone Encounter (Signed)
Fine with me

## 2017-09-05 ENCOUNTER — Ambulatory Visit (INDEPENDENT_AMBULATORY_CARE_PROVIDER_SITE_OTHER): Payer: Medicare Other | Admitting: Pulmonary Disease

## 2017-09-05 DIAGNOSIS — J45909 Unspecified asthma, uncomplicated: Secondary | ICD-10-CM | POA: Diagnosis not present

## 2017-09-05 DIAGNOSIS — I509 Heart failure, unspecified: Secondary | ICD-10-CM

## 2017-09-05 DIAGNOSIS — J9601 Acute respiratory failure with hypoxia: Secondary | ICD-10-CM

## 2017-09-05 LAB — PULMONARY FUNCTION TEST
DL/VA % PRED: 98 %
DL/VA: 4.49 ml/min/mmHg/L
DLCO COR % PRED: 50 %
DLCO COR: 10.87 ml/min/mmHg
DLCO UNC % PRED: 45 %
DLCO unc: 9.8 ml/min/mmHg
FEF 25-75 PRE: 0.41 L/s
FEF 25-75 Post: 0.32 L/sec
FEF2575-%Change-Post: -22 %
FEF2575-%PRED-POST: 22 %
FEF2575-%PRED-PRE: 29 %
FEV1-%Change-Post: -5 %
FEV1-%Pred-Post: 49 %
FEV1-%Pred-Pre: 52 %
FEV1-Post: 0.77 L
FEV1-Pre: 0.82 L
FEV1FVC-%CHANGE-POST: 5 %
FEV1FVC-%Pred-Pre: 88 %
FEV6-%CHANGE-POST: -10 %
FEV6-%Pred-Post: 54 %
FEV6-%Pred-Pre: 61 %
FEV6-Post: 1.05 L
FEV6-Pre: 1.18 L
FEV6FVC-%Change-Post: 0 %
FEV6FVC-%PRED-PRE: 103 %
FEV6FVC-%Pred-Post: 103 %
FVC-%Change-Post: -10 %
FVC-%Pred-Post: 53 %
FVC-%Pred-Pre: 59 %
FVC-Post: 1.07 L
FVC-Pre: 1.2 L
POST FEV1/FVC RATIO: 72 %
Post FEV6/FVC ratio: 98 %
Pre FEV1/FVC ratio: 68 %
Pre FEV6/FVC Ratio: 98 %
RV % pred: 102 %
RV: 2.22 L
TLC % pred: 73 %
TLC: 3.5 L

## 2017-09-05 NOTE — Progress Notes (Signed)
PFT done today. 

## 2017-09-05 NOTE — Progress Notes (Signed)
No note available for review.

## 2017-09-18 NOTE — Progress Notes (Signed)
Subjective:    Patient ID: Lisa Walters, female    DOB: 05-11-1941, 76 y.o.   MRN: [id number]  C.C.:  Follow-up for Severe, Persistent Asthma, Chronic Hypoxic Respiratory Failure, OSA, GERD, & Mild Restrictive Lung Disease.   HPI Severe, persistent asthma: Patient with severe airway obstruction on previous spirometry consistent with airway remodeling. Prescribed Symbicort with spacer. She feels the Ventolin doesn't seem to help. She does cough intermittently producing a "brown" mucus. She does feel like Symbicort has helped her breathing. She does wheeze occasionally. She is adherent to Singulair. She does feel like her degree of dyspnea has improved since she started oxygen therapy.   Chronic hypoxic respiratory failure: Prescribed oxygen with exertion. Portable oxygen concentrator ordered after last appointment. She is continuing to use oxygen as prescribed. She is in the process of getting her portable concentrator.   OSA: Currently on CPAP therapy. She reports she still is using her machine. She does have trouble falling asleep after she wakes up. She does nap but only rarely.   GERD: Patient started on Zantac at last appointment. She thinks that she may be taking Zantac. She denies any reflux or dyspepsia. She does have morning brash water taste at times.   Mild restrictive lung disease: Seen on lung volumes. No suggestion of interstitial lung disease on prior CT imaging. Likely due to chronic diastolic congestive heart failure.  Review of Systems She does feel like she has some tightness & pressure in her chest at times. She reports she is planned for an evaluation of her heart on 10/2. She reports she does have occasional near syncope. No periods of loss of consciousness. No fever or chills.  Allergies  Allergen Reactions  . Codeine     Unknown  . Sulfa Antibiotics     Unknown    Current Outpatient Prescriptions on File Prior to Visit  Medication Sig Dispense Refill  .  albuterol (PROVENTIL HFA;VENTOLIN HFA) 108 (90 Base) MCG/ACT inhaler Inhale 1-2 puffs into the lungs every 6 (six) hours as needed for wheezing or shortness of breath. 1 Inhaler 0  . albuterol (PROVENTIL HFA;VENTOLIN HFA) 108 (90 Base) MCG/ACT inhaler Inhale 2 puffs into the lungs every 4 (four) hours as needed for wheezing or shortness of breath. 1 Inhaler 3  . albuterol (PROVENTIL) (5 MG/ML) 0.5% nebulizer solution Take 2.5 mg by nebulization every 6 (six) hours as needed for wheezing or shortness of breath.     Marland Kitchen albuterol-ipratropium (COMBIVENT) 18-103 MCG/ACT inhaler Inhale 2 puffs into the lungs every 6 (six) hours as needed for wheezing or shortness of breath.     Marland Kitchen atorvastatin (LIPITOR) 20 MG tablet     . budesonide-formoterol (SYMBICORT) 160-4.5 MCG/ACT inhaler Inhale 2 puffs into the lungs 2 (two) times daily. 1 Inhaler 0  . cholecalciferol (VITAMIN D) 1000 UNITS tablet Take 1,000 Units by mouth daily.    . diazepam (VALIUM) 5 MG tablet Take 5 mg by mouth at bedtime as needed for anxiety (for muscle relaxation).    . furosemide (LASIX) 20 MG tablet Take 20 mg by mouth.    Marland Kitchen ipratropium-albuterol (DUONEB) 0.5-2.5 (3) MG/3ML SOLN Take 3 mLs by nebulization every 6 (six) hours as needed (wheezing/shortness of breath).    Marland Kitchen lisinopril (PRINIVIL,ZESTRIL) 2.5 MG tablet Take 2.5 mg by mouth daily.    Marland Kitchen losartan (COZAAR) 50 MG tablet     . meclizine (ANTIVERT) 25 MG tablet Take 25 mg by mouth every 6 (six) hours as needed for  dizziness (for vertigo).    . metFORMIN (GLUCOPHAGE) 500 MG tablet Take 500 mg by mouth 2 (two) times daily with a meal.    . ondansetron (ZOFRAN) 4 MG tablet Take 4 mg by mouth every 8 (eight) hours as needed for nausea (take if pain medication causes nausea).    Marland Kitchen PRADAXA 150 MG CAPS capsule     . ranitidine (ZANTAC) 150 MG tablet Take 1 tablet (150 mg total) by mouth at bedtime. 30 tablet 3  . Spacer/Aero Chamber Mouthpiece MISC 1 Device by Does not apply route 2 (two)  times daily. 1 each 0  . SYMBICORT 160-4.5 MCG/ACT inhaler Inhale 1 puff into the lungs every 6 (six) hours as needed. wheezing     No current facility-administered medications on file prior to visit.     Past Medical History:  Diagnosis Date  . Anemia   . Arthritis    "all over" (06/02/2013)  . Asthma   . Breast cancer ([place]) 2007   left  . CHF (congestive heart failure) ([place])   . Chronic bronchitis ([place])   . Chronic lower back pain   . Distal radial fracture 05/26/2013   right "fell" (06/02/2013)  . Exertional shortness of breath    "most of the time" (06/02/2013)  . GERD (gastroesophageal reflux disease)   . H/O hiatal hernia   . History of blood transfusion 2007   "related to breast cancer" (06/02/2013)  . Hyperlipidemia   . Hypertension   . Irregular heart beat   . Migraines    "years ago" (06/02/2013)  . On home oxygen therapy    "2L at night when I go to bed" (06/03/2103)  . Osteopenia   . Pneumonia    "walking pneumonia once; hospitalized 2nd time I had pneumonia" (06/02/2013)  . Sleep apnea   . Type II diabetes mellitus ([place])   . Urge incontinence   . Vertigo, benign positional   . Vitamin D deficiency disease     Past Surgical History:  Procedure Laterality Date  . BREAST BIOPSY Left 2007  . BREAST LUMPECTOMY Left 2007  . CARDIAC CATHETERIZATION  07/2012  . CATARACT EXTRACTION W/ INTRAOCULAR LENS  IMPLANT, BILATERAL    . JOINT REPLACEMENT    . KNEE ARTHROSCOPY Left   . LEFT HEART CATHETERIZATION WITH CORONARY ANGIOGRAM Right 08/02/2012   Procedure: LEFT HEART CATHETERIZATION WITH CORONARY ANGIOGRAM;  Surgeon: Birdie Riddle, MD;  Location: [facility]ATH [facility];  Service: Cardiovascular;  Laterality: Right;  . MEDIAL PARTIAL KNEE REPLACEMENT Left   . TOTAL KNEE ARTHROPLASTY Bilateral   . TUBAL LIGATION      Family History  Problem Relation Age of Onset  . Stroke Father   . Emphysema Brother   . Cancer Sister        brain tumor  . Kidney disease Mother   . Asthma Brother       Social History   Social History  . Marital status: Widowed    Spouse name: N/A  . Number of children: N/A  . Years of education: N/A   Social History Main Topics  . Smoking status: Former Smoker    Packs/day: 0.10    Years: 5.00    Types: Cigarettes    Quit date: 12/25/1960  . Smokeless tobacco: Never Used  . Alcohol use Yes     Comment: 06/02/2013 "quit drinking ~ 35 yr ago; never had problem w/it"  . Drug use: No  . Sexual activity: No   Other Topics  Concern  . Not on file   Social History Narrative   [facility] (07/26/17):   Originally from [place]. Has always lived in [place]. 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 No bird exposure. No mold exposure.       Objective:   Physical Exam BP 138/78 (BP Location: Left Arm, Cuff Size: Normal)   Pulse 78   Ht 5\' 2"  (1.575 m)   Wt 200 lb 2 oz (90.8 kg)   SpO2 91%   BMI 36.60 kg/m   General: Elderly female. No distress. Awake. Integument:  Warm. Dry. No rash.. Extremities:  No cyanosis or clubbing.  HEENT:  Minimal nasal turbinate swelling. No scleral icterus. No oral ulcers. Cardiovascular:  Regular rate. Unable to appreciate JVD.  Pulmonary:  Clear bilaterally with auscultation. Normal work of breathing on room air. Abdomen: Soft. Normal bowel sounds. Nondistended. Musculoskeletal:  Normal bulk and tone. No joint deformity or effusion appreciated.  PFT 09/05/17: FVC 1.20 L (59%) FEV1 0.82 L (52%) FEV1/FVC 0.68 FEF 25-75 0.41 L (29%) negative bronchodilator response TLC 3.50 L (73%) RV 102% DLCO corrected 50% 06/08/17: FVC 0.95 L (45%) FEV1 0.72 L (45%) FEV1/FVC 0.76 FEF 25-75 0.57 L (33%)  6MWT 09/04/17:  Walked 2 laps / Baseline Sat 82% on RA / Nadir Sat 94% on 2 L/m (couldn't complete 3rd lap with dyspnea) 07/26/17:  Walked 3 laps / Baseline Sat 95% on RA / Nadir Sat 87% on RA (required 6 L/m with ambulation to maintain - max heart rate 106  bpm)  IMAGING CXR PA/LAT 07/26/17 (personally reviewed by me):  No focal opacity or mass appreciated. No pleural effusion appreciated. Heart normal in size & mediastinum normal in contour.  CXR PA/LAT 01/01/17 (previously reviewed by me):  Low lung volumes. Cardiomegaly noted. Pulmonary vascular congestion noted. No frank opacity or mass appreciated. No pleural effusion. Mediastinum normal in contour.  CT CHEST W/ CONTRAST 06/10/14 (previously reviewed by me):  No pleural effusion or thickening. No pericardial effusion. No mass appreciated. Subpleural nodularity in right lower lobe seemingly stable since 2012. No pathologic mediastinal adenopathy.  CARDIAC  TTE (07/11/17):  LV normal in size with mild concentric hypertrophy. Normal wall motion. EF 55-60%. Diastolic function could not be assessed due to atrial fibrillation. LA moderately to severely dilated & RA moderately dilated. RV borderline dilation with normal function. No aortic regurgitation. Trace mitral regurgitation. Mild tricuspid regurgitation. Trace pulmonic regurgitation. No pericardial effusion. Aortic root normal in size. Normal pulmonary artery size. Pulmonary artery systolic pressure 40 mmHg.   TTE (7/3/7):  LV normal in size with EF approximately 60%. No wall motion abnormality. Mild increase in LV wall thickness. LA & RA normal in size. RV normal in size. No aortic regurgitation. No mitral regurgitation. Pulmonic valve not well visualized. No significant tricuspid regurgitation. No pericardial effusion.  LABS 07/26/17 CBC: 5.1/10.5/33.4/292 Eosinophils: 0.1 ProBNP: 597 IgE: 29 RAST panel: Cockroach 1.29 & Timothy grass 0.67  06/09/17 BNP: 273.5  04/27/17 BMP: 145/4.4/101/26/12/0.77/111/9.4    Assessment & Plan:  76 y.o. female with long-standing history of asthma.  Her pulmonary function testing suggests airway remodeling with fixed obstruction and lung volumes show mild restriction. Despite this, she has no significant  bronchodilator response. She continues to demonstrate her oxygen requirement that is present now at rest as well as with exertion. She does have an upcoming appointment with cardiology for heart catheterization which will hopefully provide more information into potential cardiac contribution  to her symptom constellation. As such, I'm not adjusting her inhaler regimen for medication regimen at this time. I instructed the patient to contact my office if she had any new breathing problems or questions before her next appointment.  1. Severe, persistent asthma:Continuing patient on Singulair and Symbicort. No changes. Switching from Ventolin to Proventil rescue inhaler. Recommended trying to use her spacer with her rescue inhaler. 2. Chronic hypoxic respiratory failure: Patient to continue on oxygen at 2 L/m as prescribed. Awaiting portable oxygen concentrator. 3. OSA: Continuing CPAP indefinitely. 4. GERD: Continuing Zantac. No changes. 5. Restrictive lung disease: Likely secondary to the patient's congestive heart failure. No need for further testing. 6. Health maintenance: Status post unspecified pneumonia vaccine in February 2018. Patient declined influenza vaccine today. 7. Follow-up: Return to clinic in 3 months or sooner if needed.  Sonia Baller Ashok Cordia, M.D. [facility] Pager:  [phone] After 3pm or if no response, call [phone] 4:07 PM 09/19/17

## 2017-09-19 ENCOUNTER — Ambulatory Visit: Payer: Medicare Other

## 2017-09-19 ENCOUNTER — Ambulatory Visit (INDEPENDENT_AMBULATORY_CARE_PROVIDER_SITE_OTHER): Payer: Medicare Other | Admitting: Pulmonary Disease

## 2017-09-19 ENCOUNTER — Other Ambulatory Visit: Payer: Medicare Other

## 2017-09-19 VITALS — BP 138/78 | HR 78 | Ht 62.0 in | Wt 200.1 lb

## 2017-09-19 DIAGNOSIS — Z9989 Dependence on other enabling machines and devices: Secondary | ICD-10-CM | POA: Diagnosis not present

## 2017-09-19 DIAGNOSIS — J9611 Chronic respiratory failure with hypoxia: Secondary | ICD-10-CM

## 2017-09-19 DIAGNOSIS — G4733 Obstructive sleep apnea (adult) (pediatric): Secondary | ICD-10-CM

## 2017-09-19 DIAGNOSIS — J4550 Severe persistent asthma, uncomplicated: Secondary | ICD-10-CM

## 2017-09-19 MED ORDER — ALBUTEROL SULFATE HFA 108 (90 BASE) MCG/ACT IN AERS: 2.0000 | INHALATION_SPRAY | RESPIRATORY_TRACT | 6 refills | Status: AC | PRN

## 2017-09-19 NOTE — Patient Instructions (Addendum)
   Continue using your medications and inhalers as prescribed.  We are changing your Ventolin inhaler over to Proventil to see if it helps more.  We will see you back in 3 months or sooner if needed. Call if you have any new breathing problems before your next appointment.   TESTS ORDERED: 1. Serum Alpha-1 Antitrypsin Phenotype

## 2017-09-22 LAB — ALPHA-1 ANTITRYPSIN PHENOTYPE: A-1 Antitrypsin, Ser: 161 mg/dL (ref 83–199)

## 2017-09-23 NOTE — H&P (Addendum)
History of Present Illness Vernell Leep MD; 09/07/2017 8:34 PM) Patient words: Last O/V 08/23/2017; 2 Week F/U for Pulmonary HTN, Diastolic Heart Failure.  The patient is a 76 year old female presenting to discuss test results. 76 year old female with persistent atrial fibrillation, hypertension, hyperlipidemia, type II diabetes mellitus, obstructive sleep apnea, h/o breast cancer (left) s/p surgery + chemoradiation. is here for follow-up of test results.  She has been having chest tightness on exertion, even though her stress test was normal. She has oxygen requirement and significant leg swelling. 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 Echocardiogram showed mild concentric LVH, LVEF 55-60%. Moderate biatrial dilatation. Mild TR with mild pulmonary hypertension with PASP of 40 mmHg.  Given her exertional chest tightness, pulmonary hypertension, I recommended that she undergo left and right heart cath with coronary angiogram.  Time walked: 6 min Distance walked: 322 ft Sats at 1 min: 97%, 2 min: 98%, 3 min: 98%, 4 min 95%, 5 min 94%, resting after 6 min: 98%  PFT 06/08/17: FVC 0.95 L (45%) FEV1 0.72 L (45%) FEV1/FVC 0.76 FEF 25-75 0.57 L (33%)    Problem List/Past Medical (April Garrison; 09/07/2017 1:46 PM) History of breast cancer (Z85.3)  Benign positional vertigo, bilateral (H81.13)  Chronic atrial fibrillation (I48.2)  CHA2DS2-VASc Score is 4 with yearly risk of stroke of 4.0%. HAS-Bled score is 1 and estimated major bleeding in one year is 1.02-1.5% Benign essential HTN (I10)  Controlled type 2 diabetes mellitus without complication, without long-term current use of insulin (E11.9)  Obstructive sleep apnea, adult (G47.33)  uses CPAP Hyperlipidemia, group A (E78.00)  Shortness of breath on exertion (R06.02)  Chest x-ray 03/09/2015: Borderline enlarged cardiac silhouette, no airspace disease. Coronary angiogram  08/02/2012: Normal left main coronary artery. Mild disease LAD artery and normal branches. Normal left circumflex artery. Mild disease of codominant RCA. LVEF 70%. Echocardiogram 03/09/2015: 1. Left ventricle cavity is normal in size. Mild concentric hypertrophy of the left ventricle. Normal global wall motion. Calculated EF 57%. 2. Left atrial cavity is moderate to severely dilated. Right atrial cavity is moderate to severely dilated. 3. No aortic valve regurgitation noted. Mild aortic valve leaflet thickening with mild calcification. No evidence of aortic valve stenosis. 4. Mild tricuspid regurgitation. Mild pulmonary hypertension. Encounter for long-term (current) use of anticoagulants (Z79.01)  Bilateral edema of lower extremity (R60.0)  Obesity (BMI 30-39.9)  Chronic diastolic (congestive) heart failure (I50.32)  Echo 06/29/2017: 1. Left ventricle cavity is normal in size. Mild concentric hypertrophy of the left ventricle. Normal global wall motion. Visual EF is 55-60%. Diastolic function could not be assessed properly due to A.Fib. 2. Left atrial cavity is moderate to severely dilated. 3. Right atrial cavity is moderately dilated. 4. Right ventricle cavity is borderline dilated. Normal right ventricular function. 5. Trace mitral regurgitation. Mild calcification of the mitral valve annulus. 6. Mild tricuspid regurgitation. Mild pulmonary hypertension with approx. PA syst. pressure of 40 mm of Hg. 7. c.f. echo. of 08/31/2016, TR has increased and mild Pulmonary HTN is new. History of asthma (Z87.09)  Exertional chest pain (R07.9)  Lexiscan myoview stress test 07/06/2017 1. The resting electrocardiogram demonstrated atrial fibrillation and normal resting conduction. Stress EKG is non-diagnostic for ischemia as it a pharmacologic stress using Lexiscan. Stress symptoms included dyspnea. 2. Myocardial perfusion imaging is normal. Overall left ventricular systolic function was normal without regional wall  motion abnormalities. The left ventricular ejection fraction was 74%.  Allergies (April Garrison; 09/07/2017  1:46 PM) Codeine/Codeine Derivatives  Dizziness. Sulfa Drugs  Dizziness.  Family History (April Garrison; 09-14-17 1:46 PM) Mother  Deceased. at age 8 from kidney disease. no known heart conditions Father  Deceased. at age 59- from stroke, had right sided paralysis Sister 1  Deceased. 5 or 6 years younger- from brain cancer (patient unsure of her age at death) Brother 1  Deceased. "several years older-in his 59's" died from PNA Brother 2  Deceased. "much older" died from COPD, poor circulation in his legs, diabetes Brother 3  Deceased. 85 years older than patient- died from infection  Social History (April Garrison; September 14, 2017 1:46 PM) Current tobacco use  Former smoker. in 1962 Non Drinker/No Alcohol Use  hasnt drank in 68 years Marital status  Widowed. Number of Children  4. Living Situation  Lives alone.  Past Surgical History (April Garrison; Sep 14, 2017 1:46 PM) Tubal Ligation [1478]: Total Knee Replacement - Right [2003]: 1 Total Knee Replacement - Left [2005]: X2 Lumpectomy [2006]:  Medication History (April Louretta Shorten; 09/14/2017 1:54 PM) Lasix (40MG  Tablet, 1 (one) Tablet Oral twice a day, Taken starting 08/23/2017) Active. Potassium Chloride Crys ER (20MEQ Tablet ER, 1 (one) Tablet Oral daily, Taken starting 08/23/2017) Active. Metoprolol Succinate ER (200MG  Tablet ER 24HR, 1 (one) Tablet Tablet Tablet Oral daily, Taken starting 06/07/2017) Active. (dose increased) Pradaxa (150MG  Capsule, 1 (one) Capsu Capsule Caps Oral two times daily, Taken starting 03/12/2017) Active. ProAir HFA (108 (90 Base)MCG/ACT Aerosol Soln, 2 Inhalation every 4-6 hours as needed) Active. Montelukast Sodium (10MG  Tablet, 1 Oral daily) Active. MetFORMIN HCl (500MG  Tablet, 1 Oral two times daily) Active. Meclizine HCl (12.5MG  Tablet, 1 Oral as needed) Active. RaNITidine  HCl (150MG  Tablet, 1 Oral bedtime) Active. Medications Reconciled (verbally with pt)  Diagnostic Studies History (April Garrison; 2017/09/14 1:53 PM) Echocardiogram [07/11/2017]: 1. Left ventricle cavity is normal in size. Mild concentric hypertrophy of the left ventricle. Normal global wall motion. Visual EF is 55-60%. Diastolic function could not be assessed properly due to A.Fib. 2. Left atrial cavity is moderate to severely dilated. 3. Right atrial cavity is moderately dilated. 4. Right ventricle cavity is borderline dilated. Normal right ventricular function. 5. Trace mitral regurgitation. Mild calcification of the mitral valve annulus. 6. Mild tricuspid regurgitation. Mild pulmonary hypertension with approx. PA syst. pressure of 40 mm of Hg. 7. c.f. echo. of 08/31/2016, TR has increased and mild Pulmonary HTN is new. Nuclear stress test [07/06/2017]: 1. The resting electrocardiogram demonstrated atrial fibrillation and normal resting conduction. Stress EKG is non-diagnostic for ischemia as it a pharmacologic stress using Lexiscan. Stress symptoms included dyspnea. 2. Myocardial perfusion imaging is normal. Overall left ventricular systolic function was normal without regional wall motion abnormalities. The left ventricular ejection fraction was 74%. Coronary Angiogram [08/02/2012]: Coronary angiogram 08/02/2012: Normal left main coronary artery. Mild disease LAD artery and normal branches. Normal left circumflex artery. Mild disease of codominant RCA. LVEF 70%.  Other Problems (April Louretta Shorten; 2017-09-14 1:46 PM) Pulmonary hypertension (I27.20)     Review of Systems Vernell Leep, MD; 14-Sep-2017 8:41 PM) General Not Present- Anorexia, Fatigue and Fever. Respiratory Present- Decreased Exercise Tolerance and Difficulty Breathing on Exertion (stable). Not Present- Cough. Cardiovascular Present- Difficulty Breathing On Exertion, Edema and Orthopnea (Occasional). Not Present-  Chest Pain, Claudications, Palpitations and Paroxysmal Nocturnal Dyspnea. Gastrointestinal Not Present- Black, Tarry Stool, Change in Bowel Habits, Constipation and Nausea. Neurological Not Present- Focal Neurological Symptoms. Endocrine Not Present- Appetite Changes, Cold Intolerance and Heat Intolerance. Hematology Not Present- Anemia, Easy Bleeding, Easy  Bruising, Petechiae and Prolonged Bleeding.  Vitals (April Garrison; 09/07/2017 1:55 PM) 09/07/2017 1:49 PM Weight: 202.44 lb Height: 63in Body Surface Area: 1.94 m Body Mass Index: 35.86 kg/m  Pulse: 74 (Regular)  Peak Flow: 1L/min P.OX: 97% (Room air) BP: 118/78 (Sitting, Left Arm, Standard)       Physical Exam Vernell Leep, MD; 09/07/2017 8:42 PM) General Mental Status-Alert. General Appearance-Cooperative, Appears stated age, Not in acute distress. Orientation-Oriented X3. Build & Nutrition-Moderately built and Obese.  Head and Neck Thyroid Gland Characteristics - no palpable nodules, no palpable enlargement.  Chest and Lung Exam Palpation Tender - No chest wall tenderness. Auscultation Breath sounds - Clear.  Cardiovascular Inspection Jugular vein - Right - No Distention. Auscultation Rhythm - Irregularly irregular. Heart Sounds - S1 is variable and No gallop present. Note: Loud S2. Murmurs & Other Heart Sounds - Murmur - No murmur.  Abdomen Inspection Contour - Obese and Pannus present. Palpation/Percussion Normal exam - Non Tender and No hepatosplenomegaly. Auscultation Normal exam - Bowel sounds normal.  Peripheral Vascular Lower Extremity Inspection - Left - No Pigmentation, No Varicose veins. Right - No Pigmentation, No Varicose veins. Palpation - Edema - Bilateral - 2+ Pitting edema. Femoral pulse - Left - Normal. Right - Normal. Popliteal pulse - Left - Feeble. Right - Feeble. Dorsalis pedis pulse - Left - Normal. Right - Normal. Posterior tibial pulse - Note: unable to  palpate. Carotid arteries - Bilateral-No Carotid bruit. Abdomen-No prominent abdominal aortic pulsation, No epigastric bruit.  Neurologic Motor-Grossly intact without any focal deficits.  Musculoskeletal Global Assessment Left Lower Extremity - normal range of motion without pain. Right Lower Extremity - normal range of motion without pain.    Assessment & Plan Joya Gaskins Cythina Mickelsen MD; 09/07/2017 8:41 PM) Pulmonary hypertension (I27.20) Chronic atrial fibrillation (I48.2) Story: CHA2DS2-VASc Score is 4 with yearly risk of stroke of 4.0%. HAS-Bled score is 1 and estimated major bleeding in one year is 1.02-1.5% Impression: EKG 06/29/2017: Atrial fibrillation with controlled response at the rate of 78 bpm, normal axis. No evidence of ischemia. Normal QT interval. No significant change from EKG 01/04/2017 Bilateral edema of lower extremity (K81.2) Chronic diastolic (congestive) heart failure (I50.32) Story: Echo 06/29/2017: 1. Left ventricle cavity is normal in size. Mild concentric hypertrophy of the left ventricle. Normal global wall motion. Visual EF is 55-60%. Diastolic function could not be assessed properly due to A.Fib. 2. Left atrial cavity is moderate to severely dilated. 3. Right atrial cavity is moderately dilated. 4. Right ventricle cavity is borderline dilated. Normal right ventricular function. 5. Trace mitral regurgitation. Mild calcification of the mitral valve annulus. 6. Mild tricuspid regurgitation. Mild pulmonary hypertension with approx. PA syst. pressure of 40 mm of Hg. 7. c.f. echo. of 08/31/2016, TR has increased and mild Pulmonary HTN is new.  Chronic diastolic failure with possible pulmonary hypertension, most likely WHO group III. No significant improvement with increasing diuretic dose. She has never had right heart cath to establish pulmonary pressures. Also, has exertional chest tightness in spite of a normal myocardial perfusion imaging study. Recommend  RHC/LHC with coronary angiogram to establish pulmonary pressures and to see if she would benefit from vasodilators. Continue lasix to 40 mg twice a day. Continue metoprolol, abigatran. WIll hold dabigatran 2 days before heart catheterization.Reiterated diet and lifestyle modifications, salt reduction and daily weighing herself. I will try ccalling her daughter Jacqulynn Cadet on [place], MD [facility] [place] Pager: [phone] Office: [phone] If no answer Cell [phone]

## 2017-09-24 NOTE — Progress Notes (Signed)
Labs 09/19/2017: H/H 10.1/33.7 Platelets 286 INR 1.8 BUN/Cr 17/1.01 Na/K 141/4.7

## 2017-09-25 ENCOUNTER — Ambulatory Visit (HOSPITAL_COMMUNITY)
Admission: RE | Disposition: A | Discharge status: Home / Self Care | Payer: Self-pay | Source: Ambulatory Visit | Attending: Cardiology

## 2017-09-25 ENCOUNTER — Ambulatory Visit (HOSPITAL_COMMUNITY)
Admission: RE | Admit: 2017-09-25 | Discharge: 2017-09-25 | Disposition: A | Discharge status: Home / Self Care | Payer: Medicare Other | Source: Ambulatory Visit | Attending: Cardiology | Admitting: Cardiology

## 2017-09-25 ENCOUNTER — Encounter (HOSPITAL_COMMUNITY): Payer: Self-pay | Admitting: Cardiology

## 2017-09-25 DIAGNOSIS — I481 Persistent atrial fibrillation: Secondary | ICD-10-CM | POA: Diagnosis not present

## 2017-09-25 DIAGNOSIS — Z87891 Personal history of nicotine dependence: Secondary | ICD-10-CM | POA: Insufficient documentation

## 2017-09-25 DIAGNOSIS — E119 Type 2 diabetes mellitus without complications: Secondary | ICD-10-CM | POA: Diagnosis not present

## 2017-09-25 DIAGNOSIS — Z96653 Presence of artificial knee joint, bilateral: Secondary | ICD-10-CM | POA: Insufficient documentation

## 2017-09-25 DIAGNOSIS — I272 Pulmonary hypertension, unspecified: Secondary | ICD-10-CM | POA: Diagnosis present

## 2017-09-25 DIAGNOSIS — I110 Hypertensive heart disease with heart failure: Secondary | ICD-10-CM | POA: Insufficient documentation

## 2017-09-25 DIAGNOSIS — Z79899 Other long term (current) drug therapy: Secondary | ICD-10-CM | POA: Diagnosis not present

## 2017-09-25 DIAGNOSIS — Z853 Personal history of malignant neoplasm of breast: Secondary | ICD-10-CM | POA: Diagnosis not present

## 2017-09-25 DIAGNOSIS — Z9221 Personal history of antineoplastic chemotherapy: Secondary | ICD-10-CM | POA: Diagnosis not present

## 2017-09-25 DIAGNOSIS — Z7984 Long term (current) use of oral hypoglycemic drugs: Secondary | ICD-10-CM | POA: Insufficient documentation

## 2017-09-25 DIAGNOSIS — E785 Hyperlipidemia, unspecified: Secondary | ICD-10-CM | POA: Insufficient documentation

## 2017-09-25 DIAGNOSIS — G4733 Obstructive sleep apnea (adult) (pediatric): Secondary | ICD-10-CM | POA: Insufficient documentation

## 2017-09-25 DIAGNOSIS — Z923 Personal history of irradiation: Secondary | ICD-10-CM | POA: Insufficient documentation

## 2017-09-25 DIAGNOSIS — I5032 Chronic diastolic (congestive) heart failure: Secondary | ICD-10-CM | POA: Diagnosis not present

## 2017-09-25 DIAGNOSIS — I2510 Atherosclerotic heart disease of native coronary artery without angina pectoris: Secondary | ICD-10-CM | POA: Insufficient documentation

## 2017-09-25 HISTORY — PX: INTRAVASCULAR PRESSURE WIRE/FFR STUDY: CATH118243

## 2017-09-25 HISTORY — PX: RIGHT/LEFT HEART CATH AND CORONARY ANGIOGRAPHY: CATH118266

## 2017-09-25 LAB — POCT I-STAT 3, ART BLOOD GAS (G3+)
ACID-BASE DEFICIT: 1 mmol/L (ref 0.0–2.0)
Bicarbonate: 25.3 mmol/L (ref 20.0–28.0)
O2 Saturation: 92 %
PCO2 ART: 45.9 mmHg (ref 32.0–48.0)
PO2 ART: 68 mmHg — AB (ref 83.0–108.0)
TCO2: 27 mmol/L (ref 22–32)
pH, Arterial: 7.349 — ABNORMAL LOW (ref 7.350–7.450)

## 2017-09-25 LAB — GLUCOSE, CAPILLARY
GLUCOSE-CAPILLARY: 120 mg/dL — AB (ref 65–99)
Glucose-Capillary: 120 mg/dL — ABNORMAL HIGH (ref 65–99)

## 2017-09-25 LAB — POCT I-STAT 3, VENOUS BLOOD GAS (G3P V)
Bicarbonate: 26 mmol/L (ref 20.0–28.0)
O2 Saturation: 57 %
PH VEN: 7.338 (ref 7.250–7.430)
TCO2: 27 mmol/L (ref 22–32)
pCO2, Ven: 48.4 mmHg (ref 44.0–60.0)
pO2, Ven: 32 mmHg (ref 32.0–45.0)

## 2017-09-25 LAB — POCT ACTIVATED CLOTTING TIME
ACTIVATED CLOTTING TIME: 356 s
ACTIVATED CLOTTING TIME: 340 s
ACTIVATED CLOTTING TIME: 219 s
Activated Clotting Time: 290 seconds
Activated Clotting Time: 219 seconds
Activated Clotting Time: 197 seconds

## 2017-09-25 SURGERY — RIGHT/LEFT HEART CATH AND CORONARY ANGIOGRAPHY
Anesthesia: LOCAL

## 2017-09-25 MED ORDER — IOPAMIDOL (ISOVUE-370) INJECTION 76%
INTRAVENOUS | Status: DC | PRN
Start: 2017-09-25 — End: 2017-09-25
  Administered 2017-09-25: 90 mL via INTRA_ARTERIAL

## 2017-09-25 MED ORDER — LIDOCAINE HCL (PF) 1 % IJ SOLN
INTRAMUSCULAR | Status: DC | PRN
  Administered 2017-09-25: 2 mL via INTRADERMAL
  Administered 2017-09-25: 3 mL via INTRADERMAL

## 2017-09-25 MED ORDER — SODIUM CHLORIDE 0.9% FLUSH: 3.0000 mL | INTRAVENOUS | Status: DC | PRN

## 2017-09-25 MED ORDER — ACETAMINOPHEN 325 MG PO TABS: 650.0000 mg | ORAL_TABLET | ORAL | Status: DC | PRN

## 2017-09-25 MED ORDER — ASPIRIN 81 MG PO CHEW
CHEWABLE_TABLET | ORAL | Status: AC
  Administered 2017-09-25: 81 mg via ORAL
  Filled 2017-09-25: qty 1

## 2017-09-25 MED ORDER — FENTANYL CITRATE (PF) 100 MCG/2ML IJ SOLN
INTRAMUSCULAR | Status: DC | PRN
  Administered 2017-09-25 (×2): 25 ug via INTRAVENOUS

## 2017-09-25 MED ORDER — MIDAZOLAM HCL 2 MG/2ML IJ SOLN
INTRAMUSCULAR | Status: AC
  Filled 2017-09-25: qty 2

## 2017-09-25 MED ORDER — SODIUM CHLORIDE 0.9% FLUSH: 3.0000 mL | Freq: Two times a day (BID) | INTRAVENOUS | Status: DC

## 2017-09-25 MED ORDER — FENTANYL CITRATE (PF) 100 MCG/2ML IJ SOLN
INTRAMUSCULAR | Status: AC
  Filled 2017-09-25: qty 2

## 2017-09-25 MED ORDER — HEPARIN (PORCINE) IN NACL 2-0.9 UNIT/ML-% IJ SOLN
INTRAMUSCULAR | Status: AC
  Filled 2017-09-25: qty 1000

## 2017-09-25 MED ORDER — IOPAMIDOL (ISOVUE-370) INJECTION 76%
INTRAVENOUS | Status: AC
  Filled 2017-09-25: qty 100

## 2017-09-25 MED ORDER — ONDANSETRON HCL 4 MG/2ML IJ SOLN: 4.0000 mg | Freq: Four times a day (QID) | INTRAMUSCULAR | Status: DC | PRN

## 2017-09-25 MED ORDER — HEPARIN SODIUM (PORCINE) 1000 UNIT/ML IJ SOLN
INTRAMUSCULAR | Status: DC | PRN
  Administered 2017-09-25 (×2): 5000 [IU] via INTRAVENOUS

## 2017-09-25 MED ORDER — ADENOSINE 12 MG/4ML IV SOLN
INTRAVENOUS | Status: AC
  Filled 2017-09-25: qty 16

## 2017-09-25 MED ORDER — SODIUM CHLORIDE 0.9 % IV SOLN: 250.0000 mL | INTRAVENOUS | Status: DC | PRN

## 2017-09-25 MED ORDER — MIDAZOLAM HCL 2 MG/2ML IJ SOLN
INTRAMUSCULAR | Status: DC | PRN
  Administered 2017-09-25: 1 mg via INTRAVENOUS

## 2017-09-25 MED ORDER — VERAPAMIL HCL 2.5 MG/ML IV SOLN
INTRAVENOUS | Status: AC
  Filled 2017-09-25: qty 2

## 2017-09-25 MED ORDER — SODIUM CHLORIDE 0.9 % IV SOLN: INTRAVENOUS | Status: AC

## 2017-09-25 MED ORDER — LABETALOL HCL 5 MG/ML IV SOLN
INTRAVENOUS | Status: AC
  Filled 2017-09-25: qty 4

## 2017-09-25 MED ORDER — ASPIRIN 81 MG PO CHEW
81.0000 mg | CHEWABLE_TABLET | ORAL | Status: AC
Start: 2017-09-25 — End: 2017-09-25
  Administered 2017-09-25: 81 mg via ORAL

## 2017-09-25 MED ORDER — LABETALOL HCL 5 MG/ML IV SOLN
10.0000 mg | INTRAVENOUS | Status: DC | PRN
  Administered 2017-09-25: 10 mg via INTRAVENOUS

## 2017-09-25 MED ORDER — LIDOCAINE HCL 2 % IJ SOLN
INTRAMUSCULAR | Status: AC
  Filled 2017-09-25: qty 10

## 2017-09-25 MED ORDER — SODIUM CHLORIDE 0.9 % IV SOLN
INTRAVENOUS | Status: DC
  Administered 2017-09-25: 07:00:00 via INTRAVENOUS

## 2017-09-25 MED ORDER — VERAPAMIL HCL 2.5 MG/ML IV SOLN
INTRAVENOUS | Status: DC | PRN
  Administered 2017-09-25: 10 mL via INTRA_ARTERIAL

## 2017-09-25 MED ORDER — ADENOSINE (DIAGNOSTIC) 140MCG/KG/MIN
INTRAVENOUS | Status: DC | PRN
  Administered 2017-09-25: 140 ug/kg/min via INTRAVENOUS

## 2017-09-25 MED ORDER — HEPARIN (PORCINE) IN NACL 2-0.9 UNIT/ML-% IJ SOLN
INTRAMUSCULAR | Status: AC | PRN
  Administered 2017-09-25: 1000 mL via INTRA_ARTERIAL

## 2017-09-25 MED ORDER — LOSARTAN POTASSIUM 50 MG PO TABS
50.0000 mg | ORAL_TABLET | Freq: Every day | ORAL | Status: DC
  Administered 2017-09-25: 50 mg via ORAL
  Filled 2017-09-25: qty 1

## 2017-09-25 MED ORDER — HEPARIN SODIUM (PORCINE) 1000 UNIT/ML IJ SOLN
INTRAMUSCULAR | Status: AC
  Filled 2017-09-25: qty 1

## 2017-09-25 SURGICAL SUPPLY — 19 items
CATH BALLN WEDGE 5F 110CM (CATHETERS) ×2 IMPLANT
CATH INFINITI 5 FR JL3.5 (CATHETERS) ×2 IMPLANT
CATH INFINITI 5FR ANG PIGTAIL (CATHETERS) ×2 IMPLANT
CATH INFINITI 5FR JL4 (CATHETERS) ×2 IMPLANT
CATH INFINITI JR4 5F (CATHETERS) ×2 IMPLANT
CATH LAUNCHER 5F EBU3.5 (CATHETERS) ×2 IMPLANT
CATH OPTITORQUE TIG 4.0 5F (CATHETERS) ×2 IMPLANT
DEVICE RAD COMP TR BAND LRG (VASCULAR PRODUCTS) ×2 IMPLANT
GLIDESHEATH SLEND A-KIT 6F 20G (SHEATH) ×2 IMPLANT
GUIDEWIRE INQWIRE 1.5J.035X260 (WIRE) ×1 IMPLANT
GUIDEWIRE PRESSURE COMET II (WIRE) ×2 IMPLANT
INQWIRE 1.5J .035X260CM (WIRE) ×2
KIT HEART LEFT (KITS) ×2 IMPLANT
PACK CARDIAC CATHETERIZATION (CUSTOM PROCEDURE TRAY) ×2 IMPLANT
SHEATH GLIDE SLENDER 4/5FR (SHEATH) ×2 IMPLANT
TRANSDUCER W/STOPCOCK (MISCELLANEOUS) ×2 IMPLANT
TUBING CIL FLEX 10 FLL-RA (TUBING) ×2 IMPLANT
VALVE GUARDIAN II ~~LOC~~ HEMO (MISCELLANEOUS) ×2 IMPLANT
WIRE HI TORQ VERSACORE-J 145CM (WIRE) ×2 IMPLANT

## 2017-09-25 NOTE — Discharge Instructions (Signed)
NO METFORMIN/GLUCOPHAGE FOR 2 DAYS ° ° °Radial Site Care °Refer to this sheet in the next few weeks. These instructions provide you with information about caring for yourself after your procedure. Your health care provider may also give you more specific instructions. Your treatment has been planned according to current medical practices, but problems sometimes occur. Call your health care provider if you have any problems or questions after your procedure. °What can I expect after the procedure? °After your procedure, it is typical to have the following: °· Bruising at the radial site that usually fades within 1-2 weeks. °· Blood collecting in the tissue (hematoma) that may be painful to the touch. It should usually decrease in size and tenderness within 1-2 weeks. ° °Follow these instructions at home: °· Take medicines only as directed by your health care provider. °· You may shower 24-48 hours after the procedure or as directed by your health care provider. Remove the bandage (dressing) and gently wash the site with plain soap and water. Pat the area dry with a clean towel. Do not rub the site, because this may cause bleeding. °· Do not take baths, swim, or use a hot tub until your health care provider approves. °· Check your insertion site every day for redness, swelling, or drainage. °· Do not apply powder or lotion to the site. °· Do not flex or bend the affected arm for 24 hours or as directed by your health care provider. °· Do not push or pull heavy objects with the affected arm for 24 hours or as directed by your health care provider. °· Do not lift over 10 lb (4.5 kg) for 5 days after your procedure or as directed by your health care provider. °· Ask your health care provider when it is okay to: °? Return to work or school. °? Resume usual physical activities or sports. °? Resume sexual activity. °· Do not drive home if you are discharged the same day as the procedure. Have someone else drive you. °· You  may drive 24 hours after the procedure unless otherwise instructed by your health care provider. °· Do not operate machinery or power tools for 24 hours after the procedure. °· If your procedure was done as an outpatient procedure, which means that you went home the same day as your procedure, a responsible adult should be with you for the first 24 hours after you arrive home. °· Keep all follow-up visits as directed by your health care provider. This is important. °Contact a health care provider if: °· You have a fever. °· You have chills. °· You have increased bleeding from the radial site. Hold pressure on the site. °Get help right away if: °· You have unusual pain at the radial site. °· You have redness, warmth, or swelling at the radial site. °· You have drainage (other than a small amount of blood on the dressing) from the radial site. °· The radial site is bleeding, and the bleeding does not stop after 30 minutes of holding steady pressure on the site. °· Your arm or hand becomes pale, cool, tingly, or numb. °This information is not intended to replace advice given to you by your health care provider. Make sure you discuss any questions you have with your health care provider. °Document Released: 01/13/2011 Document Revised: 05/18/2016 Document Reviewed: 06/29/2014 °Elsevier Interactive Patient Education © 2018 [employer] ° °

## 2017-09-25 NOTE — Interval H&P Note (Signed)
History and Physical Interval Note:  09/25/2017 7:21 AM  Lisa Walters  has presented today for surgery, with the diagnosis of heart failure, sob  The various methods of treatment have been discussed with the patient and family. After consideration of risks, benefits and other options for treatment, the patient has consented to  Procedure(s): RIGHT/LEFT HEART CATH AND CORONARY ANGIOGRAPHY (N/A) as a surgical intervention .  The patient's history has been reviewed, patient examined, no change in status, stable for surgery.  I have reviewed the patient's chart and labs.  Questions were answered to the patient's satisfaction.     1 Vessel Disease PCI CABG   No proximal LAD involvement, No proximal left dominant LCX involvement M (4); Indication 1 R (3); Indication 1   Proximal left dominant LCX involvement M (5); Indication 4 M (5); Indication 4   Proximal LAD involvement M (5); Indication 4 M (5); Indication 4   newline 2 Vessel Disease  No proximal LAD involvement M (5); Indication 7 M (4); Indication 7   Proximal LAD involvement M (6); Indication 13 A (7); Indication 13   newline 3 Vessel Disease  Low disease complexity (e.g., focal stenoses, SYNTAX <=22) M (6); Indication 18 A (7); Indication 18   Intermediate or high disease complexity (e.g., SYNTAX >=23) M (5); Indication 22 A (8); Indication 22   newline Left Main Disease  Isolated LMCA disease: ostial or midshaft A (7); Indication 24 A (9); Indication 24   Isolated LMCA disease: bifurcation involvement M (5); Indication 25 A (9); Indication 25   LMCA ostial or midshaft, concurrent low disease burden multivessel disease (e.g., 1-2 additional focal stenoses, SYNTAX <=22) A (7); Indication 26 A (9); Indication 26   LMCA ostial or midshaft, concurrent intermediate or high disease burden multivessel disease (e.g., 1-2 additional bifurcation stenoses, long stenoses, SYNTAX >=23) M (4); Indication 27 A (9); Indication 27   LMCA bifurcation  involvement, concurrent low disease burden multivessel disease (e.g., 1-2 additional focal stenoses, SYNTAX <=22) M (5); Indication 28 A (9); Indication 28   LMCA bifurcation involvement, concurrent intermediate or high disease burden multivessel disease (e.g., 1-2 additional bifurcation stenoses, long stenoses, SYNTAX >=23) R (3); Indication 29 A (9); Indication Golden

## 2017-09-25 NOTE — Progress Notes (Signed)
Site area: Right brachial a 5 french venous sheath was removed  Site Prior to Removal:  Level 0  Pressure Applied For 10 MINUTES    Bedrest Beginning at 1350p  Manual:   Yes.    Patient Status During Pull:  stable  Post Pull Groin Site:  Level 0  Post Pull Instructions Given:  Yes.    Post Pull Pulses Present:  Yes.    Dressing Applied:  Yes.    Comments:  VS remain stable during sheath pull.

## 2017-09-26 MED FILL — Lidocaine HCl Local Inj 2%: INTRAMUSCULAR | Qty: 10 | Status: AC

## 2017-10-04 NOTE — Progress Notes (Deleted)
Subjective:    Patient ID: Lisa Walters, female    DOB: [birth year], 76 y.o.   MRN: [id number]  C.C.:  Follow-up for Severe, Persistent Asthma, Chronic Hypoxic Respiratory Failure, OSA, GERD, & Mild Restrictive Lung Disease.   HPI Severe, persistent asthma: Patient does have severe airway obstruction on spirometry consistent with fibrosis/airway remodeling. Continued on Symbicort and Singulair at last appointment.  Chronic hypoxic respiratory failure: Prescribed oxygen at 2 L/m with exertion. Patient had yet to receive portable oxygen concentrator at last appointment.  OSA: Prescribed CPAP therapy.  GERD: Prescribed Zantac.   Mild restrictive lung disease: Seen on lung volumes. No suggestion of interstitial lung disease on prior CT imaging. Likely due to chronic diastolic congestive heart failure. No plan for further testing at this time.  Review of Systems ***  Allergies  Allergen Reactions  . Codeine Other (See Comments)    Unknown  . Sulfa Antibiotics Other (See Comments)    Unknown    Current Outpatient Prescriptions on File Prior to Visit  Medication Sig Dispense Refill  . acetaminophen (TYLENOL) 500 MG tablet Take 500-1,000 mg by mouth every 6 (six) hours as needed (back).    Marland Kitchen albuterol (PROVENTIL HFA;VENTOLIN HFA) 108 (90 Base) MCG/ACT inhaler Inhale 2 puffs into the lungs every 4 (four) hours as needed for wheezing or shortness of breath. 1 Inhaler 6  . albuterol (PROVENTIL) (5 MG/ML) 0.5% nebulizer solution Take 2.5 mg by nebulization every 6 (six) hours as needed for wheezing or shortness of breath.     Marland Kitchen atorvastatin (LIPITOR) 20 MG tablet Take 20 mg by mouth daily at 6 PM.     . budesonide-formoterol (SYMBICORT) 160-4.5 MCG/ACT inhaler Inhale 2 puffs into the lungs 2 (two) times daily. 1 Inhaler 0  . cholecalciferol (VITAMIN D) 1000 UNITS tablet Take 1,000 Units by mouth daily.    . furosemide (LASIX) 20 MG tablet Take 20 mg by mouth 2 (two) times daily.     Marland Kitchen  losartan (COZAAR) 50 MG tablet Take 50 mg by mouth daily.    . meclizine (ANTIVERT) 25 MG tablet Take 25 mg by mouth every 6 (six) hours as needed for dizziness (for vertigo).    . metFORMIN (GLUCOPHAGE) 500 MG tablet Take 500 mg by mouth 2 (two) times daily with a meal.    . montelukast (SINGULAIR) 10 MG tablet Take 10 mg by mouth every morning.     . potassium chloride SA (K-DUR,KLOR-CON) 20 MEQ tablet Take 20 mEq by mouth daily.    Marland Kitchen PRADAXA 150 MG CAPS capsule Take 150 mg by mouth 2 (two) times daily.     . ranitidine (ZANTAC) 150 MG tablet Take 1 tablet (150 mg total) by mouth at bedtime. 30 tablet 3   No current facility-administered medications on file prior to visit.     Past Medical History:  Diagnosis Date  . Anemia   . Arthritis    "all over" (06/02/2013)  . Asthma   . Breast cancer ([place]) 2007   left  . CHF (congestive heart failure) ([place])   . Chronic bronchitis ([place])   . Chronic lower back pain   . Distal radial fracture 05/26/2013   right "fell" (06/02/2013)  . Exertional shortness of breath    "most of the time" (06/02/2013)  . GERD (gastroesophageal reflux disease)   . H/O hiatal hernia   . History of blood transfusion 2007   "related to breast cancer" (06/02/2013)  . Hyperlipidemia   . Hypertension   .  Irregular heart beat   . Migraines    "years ago" (06/02/2013)  . On home oxygen therapy    "2L at night when I go to bed" (06/03/2103)  . Osteopenia   . Pneumonia    "walking pneumonia once; hospitalized 2nd time I had pneumonia" (06/02/2013)  . Sleep apnea   . Type II diabetes mellitus ([place])   . Urge incontinence   . Vertigo, benign positional   . Vitamin D deficiency disease     Past Surgical History:  Procedure Laterality Date  . BREAST BIOPSY Left 2007  . BREAST LUMPECTOMY Left 2007  . CARDIAC CATHETERIZATION  07/2012  . CATARACT EXTRACTION W/ INTRAOCULAR LENS  IMPLANT, BILATERAL    . INTRAVASCULAR PRESSURE WIRE/FFR STUDY N/A 09/25/2017   Procedure:  INTRAVASCULAR PRESSURE WIRE/FFR STUDY;  Surgeon: Nigel Mormon, MD;  Location: [facility];  Service: Cardiovascular;  Laterality: N/A;  . JOINT REPLACEMENT    . KNEE ARTHROSCOPY Left   . LEFT HEART CATHETERIZATION WITH CORONARY ANGIOGRAM Right 08/02/2012   Procedure: LEFT HEART CATHETERIZATION WITH CORONARY ANGIOGRAM;  Surgeon: Birdie Riddle, MD;  Location: [place] [facility];  Service: Cardiovascular;  Laterality: Right;  . MEDIAL PARTIAL KNEE REPLACEMENT Left   . RIGHT/LEFT HEART CATH AND CORONARY ANGIOGRAPHY N/A 09/25/2017   Procedure: RIGHT/LEFT HEART CATH AND CORONARY ANGIOGRAPHY;  Surgeon: Nigel Mormon, MD;  Location: [facility] [facility];  Service: Cardiovascular;  Laterality: N/A;  . TOTAL KNEE ARTHROPLASTY Bilateral   . TUBAL LIGATION      Family History  Problem Relation Age of Onset  . Stroke Father   . Emphysema Brother   . Cancer Sister        brain tumor  . Kidney disease Mother   . Asthma Brother     Social History   Social History  . Marital status: Widowed    Spouse name: N/A  . Number of children: N/A  . Years of education: N/A   Social History Main Topics  . Smoking status: Former Smoker    Packs/day: 0.10    Years: 5.00    Types: Cigarettes    Quit date: 12/25/1960  . Smokeless tobacco: Never Used  . Alcohol use Yes     Comment: 06/02/2013 "quit drinking ~ 35 yr ago; never had problem w/it"  . Drug use: No  . Sexual activity: No   Other Topics Concern  . Not on file   Social History Narrative   [facility] (07/26/17):   Originally from [facility]. Has always lived in [place]. 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 No bird exposure. No mold exposure.       Objective:   Physical Exam There were no vitals taken for this visit.  General:  Awake. Alert. No acute distress. Sitting watching TV. Family at bedside.  Integument:  Warm & dry. No rash on exposed skin. No  bruising. Extremities:  No cyanosis or clubbing.  HEENT:  Moist mucus membranes. No oral ulcers. No scleral injection or icterus. Endotracheal tube in place. PERRL. Cardiovascular:  Regular rate. No edema. No appreciable JVD.  Pulmonary:  Good aeration & clear to auscultation bilaterally. Symmetric chest wall expansion. No accessory muscle use. Abdomen: Soft. Normal bowel sounds. Nondistended. Grossly nontender. Musculoskeletal:  Normal bulk and tone. Hand grip strength 5/5 bilaterally. No joint deformity or effusion appreciated. Neurological:  Cranial nerves 2-12 grossly in tact. No meningismus. Moving all 4 extremities equally.   ***  General: Elderly female. No distress. Awake. Integument:  Warm. Dry. No rash.. Extremities:  No cyanosis or clubbing.  HEENT:  Minimal nasal turbinate swelling. No scleral icterus. No oral ulcers. Cardiovascular:  Regular rate. Unable to appreciate JVD.  Pulmonary:  Clear bilaterally with auscultation. Normal work of breathing on room air. Abdomen: Soft. Normal bowel sounds. Nondistended. Musculoskeletal:  Normal bulk and tone. No joint deformity or effusion appreciated.  PFT 09/05/17: FVC 1.20 L (59%) FEV1 0.82 L (52%) FEV1/FVC 0.68 FEF 25-75 0.41 L (29%) negative bronchodilator response TLC 3.50 L (73%) RV 102% DLCO corrected 50% 06/08/17: FVC 0.95 L (45%) FEV1 0.72 L (45%) FEV1/FVC 0.76 FEF 25-75 0.57 L (33%)  6MWT 09/04/17:  Walked 2 laps / Baseline Sat 82% on RA / Nadir Sat 94% on 2 L/m (couldn't complete 3rd lap with dyspnea) 07/26/17:  Walked 3 laps / Baseline Sat 95% on RA / Nadir Sat 87% on RA (required 6 L/m with ambulation to maintain - max heart rate 106 bpm)  IMAGING CXR PA/LAT 07/26/17 (previously reviewed by me):  No focal opacity or mass appreciated. No pleural effusion appreciated. Heart normal in size & mediastinum normal in contour.  CXR PA/LAT 01/01/17 (previously reviewed by me):  Low lung volumes. Cardiomegaly noted. Pulmonary vascular  congestion noted. No frank opacity or mass appreciated. No pleural effusion. Mediastinum normal in contour.  CT CHEST W/ CONTRAST 06/10/14 (previously reviewed by me):  No pleural effusion or thickening. No pericardial effusion. No mass appreciated. Subpleural nodularity in right lower lobe seemingly stable since 2012. No pathologic mediastinal adenopathy.  CARDIAC  TTE (07/11/17):  LV normal in size with mild concentric hypertrophy. Normal wall motion. EF 55-60%. Diastolic function could not be assessed due to atrial fibrillation. LA moderately to severely dilated & RA moderately dilated. RV borderline dilation with normal function. No aortic regurgitation. Trace mitral regurgitation. Mild tricuspid regurgitation. Trace pulmonic regurgitation. No pericardial effusion. Aortic root normal in size. Normal pulmonary artery size. Pulmonary artery systolic pressure 40 mmHg.   TTE (7/3/7):  LV normal in size with EF approximately 60%. No wall motion abnormality. Mild increase in LV wall thickness. LA & RA normal in size. RV normal in size. No aortic regurgitation. No mitral regurgitation. Pulmonic valve not well visualized. No significant tricuspid regurgitation. No pericardial effusion.  LABS 07/26/17 CBC: 5.1/10.5/33.4/292 Eosinophils: 0.1 ProBNP: 597 IgE: 29 RAST panel: Cockroach 1.29 & Timothy grass 0.67  06/09/17 BNP: 273.5  04/27/17 BMP: 145/4.4/101/26/12/0.77/111/9.4    Assessment & Plan:  76 y.o.   female with long-standing history of asthma.  Her pulmonary function testing suggests airway remodeling with fixed obstruction and lung volumes show mild restriction. Despite this, she has no significant bronchodilator response. She continues to demonstrate her oxygen requirement that is present now at rest as well as with exertion. She does have an upcoming appointment with cardiology for heart catheterization which will hopefully provide more information into potential cardiac contribution to her  symptom constellation. As such, I'm not adjusting her inhaler regimen for medication regimen at this time. I instructed the patient to contact my office if she had any new breathing problems or questions before her next appointment.  1. Severe, persistent asthma: 2. Chronic hypoxic respiratory failure: 3. OSA: 4. GERD: 5. Health maintenance: Status post unspecified pneumonia vaccine in February 2018. Patient declined influenza vaccine at last appointment. 6. Follow-up: Return to clinic in  7. Severe, persistent asthma:Continuing patient on Singulair and Symbicort. No changes. Switching from Ventolin to Proventil rescue inhaler.  Recommended trying to use her spacer with her rescue inhaler. 8. Chronic hypoxic respiratory failure: Patient to continue on oxygen at 2 L/m as prescribed. Awaiting portable oxygen concentrator. 9. OSA: Continuing CPAP indefinitely. 10. GERD: Continuing Zantac. No changes. 11. Restrictive lung disease: Likely secondary to the patient's congestive heart failure. No need for further testing. 12. Health maintenance: 13. Follow-up: Return to clinic in 3 months or sooner if needed.  Sonia Baller Ashok Cordia, M.D. [facility] Pager:  [phone] After 3pm or if no response, call [phone] 4:10 PM 10/04/17

## 2017-10-05 ENCOUNTER — Ambulatory Visit: Payer: Medicare Other | Admitting: Pulmonary Disease

## 2017-10-15 ENCOUNTER — Ambulatory Visit: Payer: Medicare Other | Admitting: Pulmonary Disease

## 2017-10-17 ENCOUNTER — Telehealth: Payer: Self-pay | Admitting: Pulmonary Disease

## 2017-10-17 NOTE — Telephone Encounter (Signed)
Checked both fax machines, did not see order. Have requested that another order be sent to the fax machine in triage.

## 2017-10-19 NOTE — Telephone Encounter (Signed)
The form is in JN's lookat

## 2017-10-19 NOTE — Telephone Encounter (Signed)
I will look at it when I'm back in office next week.

## 2017-10-22 ENCOUNTER — Ambulatory Visit (INDEPENDENT_AMBULATORY_CARE_PROVIDER_SITE_OTHER): Payer: Medicare Other | Admitting: Pulmonary Disease

## 2017-10-22 ENCOUNTER — Telehealth: Payer: Self-pay | Admitting: Pulmonary Disease

## 2017-10-22 VITALS — BP 130/70 | HR 91 | Ht 63.0 in | Wt 204.4 lb

## 2017-10-22 DIAGNOSIS — K219 Gastro-esophageal reflux disease without esophagitis: Secondary | ICD-10-CM

## 2017-10-22 DIAGNOSIS — J9611 Chronic respiratory failure with hypoxia: Secondary | ICD-10-CM

## 2017-10-22 DIAGNOSIS — J4550 Severe persistent asthma, uncomplicated: Secondary | ICD-10-CM

## 2017-10-22 DIAGNOSIS — G4733 Obstructive sleep apnea (adult) (pediatric): Secondary | ICD-10-CM

## 2017-10-22 MED ORDER — BUDESONIDE 0.25 MG/2ML IN SUSP: 0.2500 mg | Freq: Two times a day (BID) | RESPIRATORY_TRACT | 6 refills | Status: DC

## 2017-10-22 MED ORDER — RANITIDINE HCL 150 MG PO TABS: 150.0000 mg | ORAL_TABLET | Freq: Every day | ORAL | 3 refills | Status: DC

## 2017-10-22 MED ORDER — FORMOTEROL FUMARATE 20 MCG/2ML IN NEBU: 20.0000 ug | INHALATION_SOLUTION | Freq: Two times a day (BID) | RESPIRATORY_TRACT | 6 refills | Status: DC

## 2017-10-22 MED ORDER — RANITIDINE HCL 150 MG PO TABS: 150.0000 mg | ORAL_TABLET | Freq: Every day | ORAL | 3 refills | Status: AC

## 2017-10-22 NOTE — Telephone Encounter (Signed)
Spoke with the pt  She states calling to read [place] the numbers written on her SD card from CPAP  I advised that this is not necc, and she should just bring it over when she comes in for her next appt  She verbalized understanding of this and nothing further needed

## 2017-10-22 NOTE — Addendum Note (Signed)
Addended by: Della Goo C on: 10/22/2017 03:10 PM   Modules accepted: Orders

## 2017-10-22 NOTE — Telephone Encounter (Signed)
Tried contacting patients DME company, [employer]. Unable to get in touch with anyone regarding patients cpap machine. I left a message for someone to give me a call. We are needing to make sure that the patient is airview capable due to Dr. Ashok Cordia wanting a download.

## 2017-10-22 NOTE — Patient Instructions (Signed)
   We are switching your Symbicort over to Pulmicort & Perforomist in your nebulizer. Do not mix these medications.  Do not mix your Pulmicort and Perforomist   Remember to remove any dentures or partials you have before you use your Pulmicort. Remember to brush your teeth & tongue after you use your Pulmicort as well as rinse, gargle & spit to keep from getting thrush in your mouth or on your tongue (a white film).   You can still use your Albuterol rescue inhaler as well as in your nebulizer as needed.  Please bring your machine to your next appointment so we can do a download from it.

## 2017-10-22 NOTE — Progress Notes (Signed)
Subjective:    Patient ID: Lisa Walters, female    DOB: 07-Nov-1941, 76 y.o.   MRN: [id number]  C.C.:  Follow-up for Severe, Persistent Asthma, Chronic Hypoxic Respiratory Failure, OSA, GERD, & Mild Restrictive Lung Disease.   HPI Severe, persistent asthma: Currently prescribed Symbicort & Singulair. She reports she still has significant coughing & wheezing. She reports she is using her rescue medication 4-5 times daily. She is adherent to her Symbicort. She does feel her nebulizer is easier to use.    Chronic hypoxic respiratory failure: Prescribed oxygen at 2 L/m with exertion as well as portable oxygen concentrator. She has not yet received her portable concentrator. She is interested in trying to switch from [facility] if she cannot get a POC.  OSA: Prescribed CPAP therapy. At last appointment patient was having trouble falling asleep again after she awakes at night. Previously reporting rare naps. She reports she is adherent to her CPAP. She does rarely take naps.   GERD: Previously prescribed Zantac. At last appointment she was unsure if she was taking it. She reports it was sent to the wrong pharmacy. She is still having intermittent reflux.   Mild restrictive lung disease: Seen on lung volumes. No suggestion of interstitial lung disease on prior CT imaging. Likely due to chronic diastolic congestive heart failure.Deferring further testing.  Review of Systems She reports no frank chest pain or pressure. No fever or chills. No abdominal pain or nausea.   Allergies  Allergen Reactions  . Codeine Other (See Comments)    Unknown  . Sulfa Antibiotics Other (See Comments)    Unknown    Current Outpatient Prescriptions on File Prior to Visit  Medication Sig Dispense Refill  . acetaminophen (TYLENOL) 500 MG tablet Take 500-1,000 mg by mouth every 6 (six) hours as needed (back).    Marland Kitchen albuterol (PROVENTIL HFA;VENTOLIN HFA) 108 (90 Base) MCG/ACT inhaler Inhale 2 puffs into the lungs every 4  (four) hours as needed for wheezing or shortness of breath. 1 Inhaler 6  . albuterol (PROVENTIL) (5 MG/ML) 0.5% nebulizer solution Take 2.5 mg by nebulization every 6 (six) hours as needed for wheezing or shortness of breath.     Marland Kitchen atorvastatin (LIPITOR) 20 MG tablet Take 20 mg by mouth daily at 6 PM.     . budesonide-formoterol (SYMBICORT) 160-4.5 MCG/ACT inhaler Inhale 2 puffs into the lungs 2 (two) times daily. 1 Inhaler 0  . cholecalciferol (VITAMIN D) 1000 UNITS tablet Take 1,000 Units by mouth daily.    . furosemide (LASIX) 20 MG tablet Take 20 mg by mouth 2 (two) times daily.     Marland Kitchen losartan (COZAAR) 50 MG tablet Take 50 mg by mouth daily.    . meclizine (ANTIVERT) 25 MG tablet Take 25 mg by mouth every 6 (six) hours as needed for dizziness (for vertigo).    . metFORMIN (GLUCOPHAGE) 500 MG tablet Take 500 mg by mouth 2 (two) times daily with a meal.    . montelukast (SINGULAIR) 10 MG tablet Take 10 mg by mouth every morning.     . potassium chloride SA (K-DUR,KLOR-CON) 20 MEQ tablet Take 20 mEq by mouth daily.    Marland Kitchen PRADAXA 150 MG CAPS capsule Take 150 mg by mouth 2 (two) times daily.     . ranitidine (ZANTAC) 150 MG tablet Take 1 tablet (150 mg total) by mouth at bedtime. 30 tablet 3   No current facility-administered medications on file prior to visit.     Past Medical  History:  Diagnosis Date  . Anemia   . Arthritis    "all over" (06/02/2013)  . Asthma   . Breast cancer ([place]) 2007   left  . CHF (congestive heart failure) ([place])   . Chronic bronchitis ([place])   . Chronic lower back pain   . Distal radial fracture 05/26/2013   right "fell" (06/02/2013)  . Exertional shortness of breath    "most of the time" (06/02/2013)  . GERD (gastroesophageal reflux disease)   . H/O hiatal hernia   . History of blood transfusion 2007   "related to breast cancer" (06/02/2013)  . Hyperlipidemia   . Hypertension   . Irregular heart beat   . Migraines    "years ago" (06/02/2013)  . On home oxygen  therapy    "2L at night when I go to bed" (06/03/2103)  . Osteopenia   . Pneumonia    "walking pneumonia once; hospitalized 2nd time I had pneumonia" (06/02/2013)  . Sleep apnea   . Type II diabetes mellitus ([place])   . Urge incontinence   . Vertigo, benign positional   . Vitamin D deficiency disease     Past Surgical History:  Procedure Laterality Date  . BREAST BIOPSY Left 2007  . BREAST LUMPECTOMY Left 2007  . CARDIAC CATHETERIZATION  07/2012  . CATARACT EXTRACTION W/ INTRAOCULAR LENS  IMPLANT, BILATERAL    . INTRAVASCULAR PRESSURE WIRE/FFR STUDY N/A 09/25/2017   Procedure: INTRAVASCULAR PRESSURE WIRE/FFR STUDY;  Surgeon: Nigel Mormon, MD;  Location: [place] [facility];  Service: Cardiovascular;  Laterality: N/A;  . JOINT REPLACEMENT    . KNEE ARTHROSCOPY Left   . LEFT HEART CATHETERIZATION WITH CORONARY ANGIOGRAM Right 08/02/2012   Procedure: LEFT HEART CATHETERIZATION WITH CORONARY ANGIOGRAM;  Surgeon: Birdie Riddle, MD;  Location: [place] [facility];  Service: Cardiovascular;  Laterality: Right;  . MEDIAL PARTIAL KNEE REPLACEMENT Left   . RIGHT/LEFT HEART CATH AND CORONARY ANGIOGRAPHY N/A 09/25/2017   Procedure: RIGHT/LEFT HEART CATH AND CORONARY ANGIOGRAPHY;  Surgeon: Nigel Mormon, MD;  Location: [place] [facility];  Service: Cardiovascular;  Laterality: N/A;  . TOTAL KNEE ARTHROPLASTY Bilateral   . TUBAL LIGATION      Family History  Problem Relation Age of Onset  . Stroke Father   . Emphysema Brother   . Cancer Sister        brain tumor  . Kidney disease Mother   . Asthma Brother     Social History   Social History  . Marital status: Widowed    Spouse name: N/A  . Number of children: N/A  . Years of education: N/A   Social History Main Topics  . Smoking status: Former Smoker    Packs/day: 0.10    Years: 5.00    Types: Cigarettes    Quit date: 12/25/1960  . Smokeless tobacco: Never Used  . Alcohol use Yes     Comment: 06/02/2013 "quit drinking ~ 35 yr ago;  never had problem w/it"  . Drug use: No  . Sexual activity: No   Other Topics Concern  . Not on file   Social History Narrative   [facility] (07/26/17):   Originally from [facility] [facility]. Has always lived in [place]. 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 No bird exposure. No mold exposure.       Objective:   Physical Exam BP 130/70 (BP Location: Left Arm, Cuff Size: Normal)   Pulse 91  Ht 5\' 3"  (1.6 m)   Wt 204 lb 6 oz (92.7 kg)   SpO2 93%   BMI 36.20 kg/m   General:  Awake. Alert.Central obesity. No distress.  Integument:  Warm. Dry. No rash on exposed skin. Extremities:  No cyanosis or clubbing.  HEENT: No nasal turbinate swelling. Dentures in place. No scleral icterus Cardiovascular:  Regular rate. No edema. Unable to appreciate JVD.  Pulmonary:  Clear bilaterally with auscultation. Normal work of breathing on supplemental oxygen. Abdomen: Soft. Normal bowel sounds. Protuberant. Musculoskeletal:  Normal bulk and tone. No joint deformity or effusion appreciated. Neurological:  Cranial nerves 2-12 grossly in tact. No meningismus. Moving all 4 extremities equally.   PFT 09/05/17: FVC 1.20 L (59%) FEV1 0.82 L (52%) FEV1/FVC 0.68 FEF 25-75 0.41 L (29%) negative bronchodilator response TLC 3.50 L (73%) RV 102% DLCO corrected 50% 06/08/17: FVC 0.95 L (45%) FEV1 0.72 L (45%) FEV1/FVC 0.76 FEF 25-75 0.57 L (33%)  6MWT 09/04/17:  Walked 2 laps / Baseline Sat 82% on RA / Nadir Sat 94% on 2 L/m (couldn't complete 3rd lap with dyspnea) 07/26/17:  Walked 3 laps / Baseline Sat 95% on RA / Nadir Sat 87% on RA (required 6 L/m with ambulation to maintain - max heart rate 106 bpm)  IMAGING CXR PA/LAT 07/26/17 (Previously reviewed by me):  No focal opacity or mass appreciated. No pleural effusion appreciated. Heart normal in size & mediastinum normal in contour.  CXR PA/LAT 01/01/17 (previously reviewed by me):  Low lung volumes. Cardiomegaly  noted. Pulmonary vascular congestion noted. No frank opacity or mass appreciated. No pleural effusion. Mediastinum normal in contour.  CT CHEST W/ CONTRAST 06/10/14 (previously reviewed by me):  No pleural effusion or thickening. No pericardial effusion. No mass appreciated. Subpleural nodularity in right lower lobe seemingly stable since 2012. No pathologic mediastinal adenopathy.  CARDIAC  TTE (07/11/17):  LV normal in size with mild concentric hypertrophy. Normal wall motion. EF 55-60%. Diastolic function could not be assessed due to atrial fibrillation. LA moderately to severely dilated & RA moderately dilated. RV borderline dilation with normal function. No aortic regurgitation. Trace mitral regurgitation. Mild tricuspid regurgitation. Trace pulmonic regurgitation. No pericardial effusion. Aortic root normal in size. Normal pulmonary artery size. Pulmonary artery systolic pressure 40 mmHg.   TTE (7/3/7):  LV normal in size with EF approximately 60%. No wall motion abnormality. Mild increase in LV wall thickness. LA & RA normal in size. RV normal in size. No aortic regurgitation. No mitral regurgitation. Pulmonic valve not well visualized. No significant tricuspid regurgitation. No pericardial effusion.  LABS 07/26/17 CBC: 5.1/10.5/33.4/292 Eosinophils: 0.1 ProBNP: 597 IgE: 29 RAST panel: Cockroach 1.29 & Timothy grass 0.67  06/09/17 BNP: 273.5  04/27/17 BMP: 145/4.4/101/26/12/0.77/111/9.4    Assessment & Plan:  76 y.o. female with severe, persistent asthma. I do feel her dyspnea is likely multifactorial with no small contribution from deconditioning. I do believe she may benefit from transitioning from Symbicort to a nebulizer regimen to improve use of use. She is continuing to use oxygen as previously prescribed as well as her CPAP faithfully. I instructed the patient to contact my office if she had questions or concerns before her next appointment.  1. Severe, persistent asthma: Suboptimal  control. Switching patient from Symbicort to Pulmicort 0.25 mg & Perforomist both nebulized twice daily. Continuing albuterol rescue medication. Continuing Singulair. 2. Chronic hypoxic respiratory failure: Continuing on oxygen as previously prescribed. Having staff investigate why she has not yet received her portable oxygen  concentrator. 3. OSA: Continuing CPAP indefinitely. Requested patient bring machine with her to her next appointment to obtain compliance download. 4. GERD: Sending Zantac prescription to her pharmacy of choice. 5. Health maintenance: Status post unspecified pneumonia vaccine February 2018. Previously declined influenza vaccine at last appointment. 6. Follow-up: Return to clinic in 3 months or sooner if needed.  Sonia Baller Ashok Cordia, M.D. [facility] Pager:  [phone] After 3pm or if no response, call [phone] 2:38 PM 10/22/17

## 2017-10-23 NOTE — Telephone Encounter (Signed)
Form is done & in my "Sign" folder.

## 2017-10-23 NOTE — Telephone Encounter (Signed)
JN- have you had a chance to look at this form yet? Please advise, thanks!

## 2017-10-23 NOTE — Telephone Encounter (Signed)
[employer] [employer] checking on status of oxygen order [id number] Fax# Junie Panning [phone]

## 2017-10-24 NOTE — Telephone Encounter (Signed)
Will forward to CC to follow up on forms.

## 2017-10-24 NOTE — Telephone Encounter (Signed)
Form was filled out by JN. Form has been faxed and saved to be followed up on.

## 2017-10-26 ENCOUNTER — Other Ambulatory Visit: Payer: Self-pay

## 2017-10-26 MED ORDER — BUDESONIDE 0.25 MG/2ML IN SUSP: 0.2500 mg | Freq: Two times a day (BID) | RESPIRATORY_TRACT | 6 refills | Status: DC

## 2017-11-18 ENCOUNTER — Encounter (HOSPITAL_COMMUNITY): Payer: Self-pay | Admitting: *Deleted

## 2017-11-18 ENCOUNTER — Emergency Department (HOSPITAL_COMMUNITY): Payer: Medicare Other

## 2017-11-18 ENCOUNTER — Emergency Department (HOSPITAL_COMMUNITY)
Admission: EM | Admit: 2017-11-18 | Discharge: 2017-11-18 | Disposition: A | Discharge status: Home / Self Care | Payer: Medicare Other | Attending: Emergency Medicine | Admitting: Emergency Medicine

## 2017-11-18 ENCOUNTER — Other Ambulatory Visit: Payer: Self-pay

## 2017-11-18 DIAGNOSIS — I110 Hypertensive heart disease with heart failure: Secondary | ICD-10-CM | POA: Diagnosis not present

## 2017-11-18 DIAGNOSIS — Z96653 Presence of artificial knee joint, bilateral: Secondary | ICD-10-CM | POA: Insufficient documentation

## 2017-11-18 DIAGNOSIS — Z79899 Other long term (current) drug therapy: Secondary | ICD-10-CM | POA: Diagnosis not present

## 2017-11-18 DIAGNOSIS — I509 Heart failure, unspecified: Secondary | ICD-10-CM | POA: Diagnosis not present

## 2017-11-18 DIAGNOSIS — Z7984 Long term (current) use of oral hypoglycemic drugs: Secondary | ICD-10-CM | POA: Diagnosis not present

## 2017-11-18 DIAGNOSIS — E119 Type 2 diabetes mellitus without complications: Secondary | ICD-10-CM | POA: Insufficient documentation

## 2017-11-18 DIAGNOSIS — J45909 Unspecified asthma, uncomplicated: Secondary | ICD-10-CM | POA: Insufficient documentation

## 2017-11-18 DIAGNOSIS — R0600 Dyspnea, unspecified: Secondary | ICD-10-CM | POA: Diagnosis not present

## 2017-11-18 DIAGNOSIS — Z87891 Personal history of nicotine dependence: Secondary | ICD-10-CM | POA: Insufficient documentation

## 2017-11-18 LAB — BASIC METABOLIC PANEL
Anion gap: 8 (ref 5–15)
BUN: 23 mg/dL — ABNORMAL HIGH (ref 6–20)
CO2: 24 mmol/L (ref 22–32)
Calcium: 8.8 mg/dL — ABNORMAL LOW (ref 8.9–10.3)
Chloride: 104 mmol/L (ref 101–111)
Creatinine, Ser: 1.25 mg/dL — ABNORMAL HIGH (ref 0.44–1.00)
GFR calc Af Amer: 48 mL/min — ABNORMAL LOW (ref >60)
GFR calc non Af Amer: 41 mL/min — ABNORMAL LOW (ref >60)
Glucose, Bld: 234 mg/dL — ABNORMAL HIGH (ref 65–99)
Potassium: 4.1 mmol/L (ref 3.5–5.1)
Sodium: 136 mmol/L (ref 135–145)

## 2017-11-18 LAB — CBC
HCT: 32.1 % — ABNORMAL LOW (ref 36.0–46.0)
Hemoglobin: 9.6 g/dL — ABNORMAL LOW (ref 12.0–15.0)
MCH: 25.1 pg — ABNORMAL LOW (ref 26.0–34.0)
MCHC: 29.9 g/dL — ABNORMAL LOW (ref 30.0–36.0)
MCV: 83.8 fL (ref 78.0–100.0)
Platelets: 269 10*3/uL (ref 150–400)
RBC: 3.83 MIL/uL — ABNORMAL LOW (ref 3.87–5.11)
RDW: 17.5 % — ABNORMAL HIGH (ref 11.5–15.5)
WBC: 4.3 10*3/uL (ref 4.0–10.5)

## 2017-11-18 LAB — I-STAT TROPONIN, ED: Troponin i, poc: 0 ng/mL (ref 0.00–0.08)

## 2017-11-18 MED ORDER — IPRATROPIUM-ALBUTEROL 0.5-2.5 (3) MG/3ML IN SOLN
3.0000 mL | Freq: Once | RESPIRATORY_TRACT | Status: AC
  Administered 2017-11-18: 3 mL via RESPIRATORY_TRACT
  Filled 2017-11-18: qty 3

## 2017-11-18 NOTE — ED Triage Notes (Signed)
Pt states sob and intermittent chest tightness since she woke up this am.  Pt has not had to increase her O2 above her normal 1L home o2.

## 2017-11-18 NOTE — ED Notes (Signed)
Pt able to ambulate short distances but get SOB walking any more than 5 feet.  Per Pt this has been close to her norm for a couple months. Pt saw her Cardiologist and Pulmonologist last week. Pt takes a diuretic but did not take it today.

## 2017-11-18 NOTE — ED Provider Notes (Signed)
[facility] EMERGENCY DEPARTMENT Provider Note   CSN: [id number] Arrival date & time: 11/18/17  1108     History   Chief Complaint Chief Complaint  Patient presents with  . Shortness of Breath    HPI Lisa Walters is a 76 y.o. female.  HPI   76 year old female with intermittent shortness of breath and chest tightness.  Symptoms wax and wane.  She is not the greatest historian.  It seems like the symptoms may be chronic but worsening since yesterday?  Describes a tightness in the center of her chest.  This comes and goes as well, but has been constant since she woke up this morning and was getting ready for church around 8 AM.  Does not radiate.  No appreciable exacerbating relieving factors.  Reports compliance with her medications aside from missing her morning dose today.  Past Medical History:  Diagnosis Date  . Anemia   . Arthritis    "all over" (06/02/2013)  . Asthma   . Breast cancer ([place]) 2007   left  . CHF (congestive heart failure) ([place])   . Chronic bronchitis ([place])   . Chronic lower back pain   . Distal radial fracture 05/26/2013   right "fell" (06/02/2013)  . Exertional shortness of breath    "most of the time" (06/02/2013)  . GERD (gastroesophageal reflux disease)   . H/O hiatal hernia   . History of blood transfusion 2007   "related to breast cancer" (06/02/2013)  . Hyperlipidemia   . Hypertension   . Irregular heart beat   . Migraines    "years ago" (06/02/2013)  . On home oxygen therapy    "2L at night when I go to bed" (06/03/2103)  . Osteopenia   . Pneumonia    "walking pneumonia once; hospitalized 2nd time I had pneumonia" (06/02/2013)  . Sleep apnea   . Type II diabetes mellitus ([place])   . Urge incontinence   . Vertigo, benign positional   . Vitamin D deficiency disease     Patient Active Problem List   Diagnosis Date Noted  . Pulmonary hypertension ([place]) 09/23/2017  . Asthma 07/26/2017  . Congestive heart failure (CHF) ([place])  07/26/2017  . OSA on CPAP 07/26/2017  . Atrial fibrillation ([place]) 07/26/2017  . Chronic respiratory failure with hypoxia ([place]) 07/26/2017  . Hyperlipidemia 07/26/2017  . GERD (gastroesophageal reflux disease) 07/26/2017  . Diabetes mellitus ([place]) 07/26/2017  . Osteopenia   . Vitamin D deficiency disease   . Breast cancer ([place])   . Irregular heart beat     Past Surgical History:  Procedure Laterality Date  . BREAST BIOPSY Left 2007  . BREAST LUMPECTOMY Left 2007  . CARDIAC CATHETERIZATION  07/2012  . CATARACT EXTRACTION W/ INTRAOCULAR LENS  IMPLANT, BILATERAL    . INTRAVASCULAR PRESSURE WIRE/FFR STUDY N/A 09/25/2017   Procedure: INTRAVASCULAR PRESSURE WIRE/FFR STUDY;  Surgeon: Nigel Mormon, MD;  Location: [facility];  Service: Cardiovascular;  Laterality: N/A;  . JOINT REPLACEMENT    . KNEE ARTHROSCOPY Left   . LEFT HEART CATHETERIZATION WITH CORONARY ANGIOGRAM Right 08/02/2012   Procedure: LEFT HEART CATHETERIZATION WITH CORONARY ANGIOGRAM;  Surgeon: Birdie Riddle, MD;  Location: [facility];  Service: Cardiovascular;  Laterality: Right;  . MEDIAL PARTIAL KNEE REPLACEMENT Left   . RIGHT/LEFT HEART CATH AND CORONARY ANGIOGRAPHY N/A 09/25/2017   Procedure: RIGHT/LEFT HEART CATH AND CORONARY ANGIOGRAPHY;  Surgeon: Nigel Mormon, MD;  Location: [facility];  Service: Cardiovascular;  Laterality: N/A;  . TOTAL KNEE ARTHROPLASTY Bilateral   . TUBAL LIGATION      OB History    Gravida Para Term Preterm AB Living   4 4 4     4    SAB TAB Ectopic Multiple Live Births                   Home Medications    Prior to Admission medications   Medication Sig Start Date End Date Taking? Authorizing Provider  acetaminophen (TYLENOL) 500 MG tablet Take 500-1,000 mg by mouth every 6 (six) hours as needed (back).    [provider]  albuterol (PROVENTIL HFA;VENTOLIN HFA) 108 (90 Base) MCG/ACT inhaler Inhale 2 puffs into the lungs every 4 (four) hours as  needed for wheezing or shortness of breath. 09/19/17   Javier Glazier, MD  albuterol (PROVENTIL) (5 MG/ML) 0.5% nebulizer solution Take 2.5 mg by nebulization every 6 (six) hours as needed for wheezing or shortness of breath.     [provider]  atorvastatin (LIPITOR) 20 MG tablet Take 20 mg by mouth daily at 6 PM.  06/25/17   [provider]  budesonide (PULMICORT) 0.25 MG/2ML nebulizer solution Take 2 mLs (0.25 mg total) by nebulization 2 (two) times daily. DX: J45.909 10/26/17   Javier Glazier, MD  budesonide-formoterol [facility]) 160-4.5 MCG/ACT inhaler Inhale 2 puffs into the lungs 2 (two) times daily. 07/26/17   Javier Glazier, MD  cholecalciferol (VITAMIN D) 1000 UNITS tablet Take 1,000 Units by mouth daily.    [provider]  formoterol (PERFOROMIST) 20 MCG/2ML nebulizer solution Take 2 mLs (20 mcg total) by nebulization 2 (two) times daily. 10/22/17   Javier Glazier, MD  furosemide (LASIX) 20 MG tablet Take 20 mg by mouth 2 (two) times daily.     [provider]  losartan (COZAAR) 50 MG tablet Take 50 mg by mouth daily.    [provider]  meclizine (ANTIVERT) 25 MG tablet Take 25 mg by mouth every 6 (six) hours as needed for dizziness (for vertigo).    [provider]  metFORMIN (GLUCOPHAGE) 500 MG tablet Take 500 mg by mouth 2 (two) times daily with a meal.    [provider]  montelukast (SINGULAIR) 10 MG tablet Take 10 mg by mouth every morning.     [provider]  potassium chloride SA (K-DUR,KLOR-CON) 20 MEQ tablet Take 20 mEq by mouth daily.    [provider]  PRADAXA 150 MG CAPS capsule Take 150 mg by mouth 2 (two) times daily.  06/14/17   [provider]  ranitidine (ZANTAC) 150 MG tablet Take 1 tablet (150 mg total) by mouth at bedtime. 10/22/17   Javier Glazier, MD  ranitidine (ZANTAC) 150 MG tablet Take 1 tablet (150 mg total) by mouth at bedtime. 10/22/17   Javier Glazier, MD    Family History Family History  Problem Relation Age of Onset  . Stroke Father   . Emphysema Brother   . Cancer Sister        brain tumor  . Kidney disease Mother   . Asthma Brother     Social History Social History   Tobacco Use  . Smoking status: Former Smoker    Packs/day: 0.10    Years: 5.00    Pack years: 0.50    Types: Cigarettes    Last attempt to quit: 12/25/1960    Years since quitting: 56.9  .  Smokeless tobacco: Never Used  Substance Use Topics  . Alcohol use: Yes    Comment: 06/02/2013 "quit drinking ~ 35 yr ago; never had problem w/it"  . Drug use: No     Allergies   Codeine and Sulfa antibiotics   Review of Systems Review of Systems  All systems reviewed and negative, other than as noted in HPI.  Physical Exam Updated Vital Signs BP 100/65   Pulse 70   Temp 98.1 F (36.7 C) (Oral)   Resp (!) 23   Wt 92.5 kg (204 lb)   SpO2 98%   BMI 36.14 kg/m   Physical Exam  Constitutional: She appears well-developed and well-nourished. No distress.  HENT:  Head: Normocephalic and atraumatic.  Eyes: Conjunctivae are normal. Right eye exhibits no discharge. Left eye exhibits no discharge.  Neck: Neck supple.  Cardiovascular: Normal rate, regular rhythm and normal heart sounds. Exam reveals no gallop and no friction rub.  No murmur heard. Pulmonary/Chest: Effort normal. No accessory muscle usage. No respiratory distress.  Occasional wheezing bilaterally.  Abdominal: Soft. She exhibits no distension. There is no tenderness.  Musculoskeletal: She exhibits no edema or tenderness.  Neurological: She is alert.  Skin: Skin is warm and dry.  Psychiatric: She has a normal mood and affect. Her behavior is normal. Thought content normal.  Nursing note and vitals reviewed.    ED Treatments / Results  Labs (all labs ordered are listed, but only abnormal results are displayed) Labs Reviewed  BASIC METABOLIC PANEL - Abnormal; Notable for the following  components:      Result Value   Glucose, Bld 234 (*)    BUN 23 (*)    Creatinine, Ser 1.25 (*)    Calcium 8.8 (*)    GFR calc non Af Amer 41 (*)    GFR calc Af Amer 48 (*)    All other components within normal limits  CBC - Abnormal; Notable for the following components:   RBC 3.83 (*)    Hemoglobin 9.6 (*)    HCT 32.1 (*)    MCH 25.1 (*)    MCHC 29.9 (*)    RDW 17.5 (*)    All other components within normal limits  I-STAT TROPONIN, ED    EKG  EKG Interpretation  Date/Time:  Sunday November 18 2017 11:14:02 EST Ventricular Rate:  84 PR Interval:    QRS Duration: 70 QT Interval:  364 QTC Calculation: 430 R Axis:   82 Text Interpretation:  Atrial fibrillation Low voltage QRS Nonspecific T wave abnormality Abnormal ECG Confirmed by Virgel Manifold [phone]) on 11/18/2017 11:42:33 AM       Radiology Dg Chest 2 View  Result Date: 11/18/2017 CLINICAL DATA:  Shortness of breath. EXAM: CHEST  2 VIEW COMPARISON:  Radiographs of July 26, 2017. FINDINGS: Stable cardiomediastinal silhouette. Atherosclerosis of thoracic aorta is noted. No pneumothorax is noted. Minimal bilateral pleural effusions are noted. Minimal bibasilar subsegmental atelectasis is noted. Bony thorax is unremarkable. IMPRESSION: Minimal bibasilar subsegmental atelectasis. Minimal bilateral pleural effusions. Electronically Signed   By: Marijo Conception, M.D.   On: 11/18/2017 11:48    Procedures Procedures (including critical care time)  Medications Ordered in ED Medications - No data to display   Initial Impression / Assessment and Plan / ED Course  I have reviewed the triage vital signs and the nursing notes.  Pertinent labs & imaging results that were available during my care of the patient were reviewed by me and considered in my  medical decision making (see chart for details).     76yF with CP/dyspnea. Now much improved. Underlying lung disease.  She has had a cardiac catheterization in the last few  months.  Mild, nonobstructive CAD.  Chest pain seems atypical for ACS.  Initial troponin is normal.  She does not appear distressed.  Oxygen saturations remain in the mid 90s to 100% on 1 L of oxygen which is her baseline.  Final Clinical Impressions(s) / ED Diagnoses   Final diagnoses:  Dyspnea, unspecified type    ED Discharge Orders    None       Virgel Manifold, MD 11/20/17 1034

## 2017-11-18 NOTE — ED Notes (Signed)
Pt verbalized understanding discharge instructions and denies any further needs or questions at this time. VS stable 

## 2017-12-31 ENCOUNTER — Emergency Department (HOSPITAL_COMMUNITY): Payer: Medicare Other

## 2017-12-31 ENCOUNTER — Inpatient Hospital Stay (HOSPITAL_COMMUNITY)
Admission: EM | Admit: 2017-12-31 | Discharge: 2018-01-04 | DRG: 291 | Disposition: A | Discharge status: Home Health | Payer: Medicare Other | Attending: Internal Medicine | Admitting: Internal Medicine

## 2017-12-31 ENCOUNTER — Encounter (HOSPITAL_COMMUNITY): Payer: Self-pay | Admitting: Emergency Medicine

## 2017-12-31 ENCOUNTER — Other Ambulatory Visit: Payer: Self-pay

## 2017-12-31 DIAGNOSIS — I509 Heart failure, unspecified: Secondary | ICD-10-CM

## 2017-12-31 DIAGNOSIS — Z9989 Dependence on other enabling machines and devices: Secondary | ICD-10-CM

## 2017-12-31 DIAGNOSIS — I4891 Unspecified atrial fibrillation: Secondary | ICD-10-CM | POA: Diagnosis not present

## 2017-12-31 DIAGNOSIS — M8580 Other specified disorders of bone density and structure, unspecified site: Secondary | ICD-10-CM | POA: Diagnosis present

## 2017-12-31 DIAGNOSIS — Z885 Allergy status to narcotic agent status: Secondary | ICD-10-CM

## 2017-12-31 DIAGNOSIS — Z79899 Other long term (current) drug therapy: Secondary | ICD-10-CM

## 2017-12-31 DIAGNOSIS — R0602 Shortness of breath: Secondary | ICD-10-CM | POA: Diagnosis not present

## 2017-12-31 DIAGNOSIS — J9611 Chronic respiratory failure with hypoxia: Secondary | ICD-10-CM | POA: Diagnosis present

## 2017-12-31 DIAGNOSIS — E559 Vitamin D deficiency, unspecified: Secondary | ICD-10-CM | POA: Diagnosis present

## 2017-12-31 DIAGNOSIS — I272 Pulmonary hypertension, unspecified: Secondary | ICD-10-CM

## 2017-12-31 DIAGNOSIS — Z7951 Long term (current) use of inhaled steroids: Secondary | ICD-10-CM

## 2017-12-31 DIAGNOSIS — I2609 Other pulmonary embolism with acute cor pulmonale: Secondary | ICD-10-CM | POA: Diagnosis present

## 2017-12-31 DIAGNOSIS — Z87891 Personal history of nicotine dependence: Secondary | ICD-10-CM

## 2017-12-31 DIAGNOSIS — G4733 Obstructive sleep apnea (adult) (pediatric): Secondary | ICD-10-CM | POA: Diagnosis present

## 2017-12-31 DIAGNOSIS — M1990 Unspecified osteoarthritis, unspecified site: Secondary | ICD-10-CM | POA: Diagnosis present

## 2017-12-31 DIAGNOSIS — E119 Type 2 diabetes mellitus without complications: Secondary | ICD-10-CM | POA: Diagnosis present

## 2017-12-31 DIAGNOSIS — I110 Hypertensive heart disease with heart failure: Principal | ICD-10-CM | POA: Diagnosis present

## 2017-12-31 DIAGNOSIS — Z7902 Long term (current) use of antithrombotics/antiplatelets: Secondary | ICD-10-CM

## 2017-12-31 DIAGNOSIS — Z961 Presence of intraocular lens: Secondary | ICD-10-CM | POA: Diagnosis present

## 2017-12-31 DIAGNOSIS — G8929 Other chronic pain: Secondary | ICD-10-CM | POA: Diagnosis present

## 2017-12-31 DIAGNOSIS — J45909 Unspecified asthma, uncomplicated: Secondary | ICD-10-CM | POA: Diagnosis present

## 2017-12-31 DIAGNOSIS — Z9841 Cataract extraction status, right eye: Secondary | ICD-10-CM

## 2017-12-31 DIAGNOSIS — Z6833 Body mass index (BMI) 33.0-33.9, adult: Secondary | ICD-10-CM

## 2017-12-31 DIAGNOSIS — K219 Gastro-esophageal reflux disease without esophagitis: Secondary | ICD-10-CM | POA: Diagnosis present

## 2017-12-31 DIAGNOSIS — Z853 Personal history of malignant neoplasm of breast: Secondary | ICD-10-CM

## 2017-12-31 DIAGNOSIS — Z825 Family history of asthma and other chronic lower respiratory diseases: Secondary | ICD-10-CM

## 2017-12-31 DIAGNOSIS — H8110 Benign paroxysmal vertigo, unspecified ear: Secondary | ICD-10-CM | POA: Diagnosis present

## 2017-12-31 DIAGNOSIS — Z882 Allergy status to sulfonamides status: Secondary | ICD-10-CM

## 2017-12-31 DIAGNOSIS — I482 Chronic atrial fibrillation: Secondary | ICD-10-CM | POA: Diagnosis present

## 2017-12-31 DIAGNOSIS — Z9981 Dependence on supplemental oxygen: Secondary | ICD-10-CM

## 2017-12-31 DIAGNOSIS — I5033 Acute on chronic diastolic (congestive) heart failure: Secondary | ICD-10-CM | POA: Diagnosis present

## 2017-12-31 DIAGNOSIS — E785 Hyperlipidemia, unspecified: Secondary | ICD-10-CM | POA: Diagnosis present

## 2017-12-31 DIAGNOSIS — I2721 Secondary pulmonary arterial hypertension: Secondary | ICD-10-CM | POA: Diagnosis present

## 2017-12-31 DIAGNOSIS — I2510 Atherosclerotic heart disease of native coronary artery without angina pectoris: Secondary | ICD-10-CM | POA: Diagnosis present

## 2017-12-31 DIAGNOSIS — Z9842 Cataract extraction status, left eye: Secondary | ICD-10-CM

## 2017-12-31 DIAGNOSIS — Z96653 Presence of artificial knee joint, bilateral: Secondary | ICD-10-CM | POA: Diagnosis present

## 2017-12-31 DIAGNOSIS — I481 Persistent atrial fibrillation: Secondary | ICD-10-CM | POA: Diagnosis present

## 2017-12-31 DIAGNOSIS — Q2545 Double aortic arch: Secondary | ICD-10-CM

## 2017-12-31 DIAGNOSIS — Z808 Family history of malignant neoplasm of other organs or systems: Secondary | ICD-10-CM

## 2017-12-31 DIAGNOSIS — J4550 Severe persistent asthma, uncomplicated: Secondary | ICD-10-CM | POA: Diagnosis present

## 2017-12-31 DIAGNOSIS — Z7984 Long term (current) use of oral hypoglycemic drugs: Secondary | ICD-10-CM

## 2017-12-31 LAB — CBC WITH DIFFERENTIAL/PLATELET
Basophils Absolute: 0 10*3/uL (ref 0.0–0.1)
Basophils Relative: 0 %
EOS ABS: 0.3 10*3/uL (ref 0.0–0.7)
EOS PCT: 5 %
HCT: 30 % — ABNORMAL LOW (ref 36.0–46.0)
HEMOGLOBIN: 9.2 g/dL — AB (ref 12.0–15.0)
LYMPHS ABS: 1.4 10*3/uL (ref 0.7–4.0)
LYMPHS PCT: 27 %
MCH: 25.8 pg — AB (ref 26.0–34.0)
MCHC: 30.7 g/dL (ref 30.0–36.0)
MCV: 84 fL (ref 78.0–100.0)
Monocytes Absolute: 0.3 10*3/uL (ref 0.1–1.0)
Monocytes Relative: 5 %
NEUTROS PCT: 63 %
Neutro Abs: 3.2 10*3/uL (ref 1.7–7.7)
PLATELETS: 278 10*3/uL (ref 150–400)
RBC: 3.57 MIL/uL — AB (ref 3.87–5.11)
RDW: 18.4 % — ABNORMAL HIGH (ref 11.5–15.5)
WBC: 5.1 10*3/uL (ref 4.0–10.5)

## 2017-12-31 LAB — BASIC METABOLIC PANEL
Anion gap: 10 (ref 5–15)
BUN: 20 mg/dL (ref 6–20)
CO2: 27 mmol/L (ref 22–32)
Calcium: 8.9 mg/dL (ref 8.9–10.3)
Chloride: 100 mmol/L — ABNORMAL LOW (ref 101–111)
Creatinine, Ser: 1.18 mg/dL — ABNORMAL HIGH (ref 0.44–1.00)
GFR calc Af Amer: 51 mL/min — ABNORMAL LOW (ref >60)
GFR calc non Af Amer: 44 mL/min — ABNORMAL LOW (ref >60)
Glucose, Bld: 99 mg/dL (ref 65–99)
POTASSIUM: 4 mmol/L (ref 3.5–5.1)
SODIUM: 137 mmol/L (ref 135–145)

## 2017-12-31 LAB — BRAIN NATRIURETIC PEPTIDE: B NATRIURETIC PEPTIDE 5: 375.3 pg/mL — AB (ref 0.0–100.0)

## 2017-12-31 LAB — TROPONIN I: Troponin I: <0.03 ng/mL (ref <0.03)

## 2017-12-31 MED ORDER — ONDANSETRON HCL 4 MG/2ML IJ SOLN: 4.0000 mg | Freq: Four times a day (QID) | INTRAMUSCULAR | Status: DC | PRN

## 2017-12-31 MED ORDER — ALBUTEROL SULFATE (2.5 MG/3ML) 0.083% IN NEBU
5.0000 mg | INHALATION_SOLUTION | Freq: Once | RESPIRATORY_TRACT | Status: AC
  Administered 2017-12-31: 5 mg via RESPIRATORY_TRACT
  Filled 2017-12-31: qty 6

## 2017-12-31 MED ORDER — MONTELUKAST SODIUM 10 MG PO TABS
10.0000 mg | ORAL_TABLET | Freq: Every day | ORAL | Status: DC
  Administered 2018-01-01 – 2018-01-04 (×4): 10 mg via ORAL
  Filled 2017-12-31 (×4): qty 1

## 2017-12-31 MED ORDER — SODIUM CHLORIDE 0.9% FLUSH: 3.0000 mL | INTRAVENOUS | Status: DC | PRN

## 2017-12-31 MED ORDER — ACETAMINOPHEN 325 MG PO TABS: 650.0000 mg | ORAL_TABLET | ORAL | Status: DC | PRN

## 2017-12-31 MED ORDER — METOPROLOL SUCCINATE ER 100 MG PO TB24
200.0000 mg | ORAL_TABLET | Freq: Every day | ORAL | Status: DC
  Administered 2018-01-01 – 2018-01-03 (×3): 200 mg via ORAL
  Filled 2017-12-31 (×4): qty 2

## 2017-12-31 MED ORDER — ALBUTEROL SULFATE (2.5 MG/3ML) 0.083% IN NEBU: 2.5000 mg | INHALATION_SOLUTION | Freq: Four times a day (QID) | RESPIRATORY_TRACT | Status: DC | PRN

## 2017-12-31 MED ORDER — FUROSEMIDE 10 MG/ML IJ SOLN
40.0000 mg | Freq: Four times a day (QID) | INTRAMUSCULAR | Status: DC
  Administered 2017-12-31 – 2018-01-01 (×4): 40 mg via INTRAVENOUS
  Filled 2017-12-31 (×4): qty 4

## 2017-12-31 MED ORDER — SODIUM CHLORIDE 0.9 % IV SOLN: 250.0000 mL | INTRAVENOUS | Status: DC | PRN

## 2017-12-31 MED ORDER — FLUTICASONE FUROATE-VILANTEROL 200-25 MCG/INH IN AEPB
1.0000 | INHALATION_SPRAY | Freq: Every day | RESPIRATORY_TRACT | Status: DC
  Administered 2018-01-01 – 2018-01-04 (×4): 1 via RESPIRATORY_TRACT
  Filled 2017-12-31: qty 28

## 2017-12-31 MED ORDER — ATORVASTATIN CALCIUM 20 MG PO TABS
20.0000 mg | ORAL_TABLET | Freq: Every day | ORAL | Status: DC
  Administered 2018-01-01 – 2018-01-03 (×3): 20 mg via ORAL
  Filled 2017-12-31 (×4): qty 1

## 2017-12-31 MED ORDER — INSULIN ASPART 100 UNIT/ML ~~LOC~~ SOLN
0.0000 [IU] | Freq: Three times a day (TID) | SUBCUTANEOUS | Status: DC
  Administered 2018-01-01 – 2018-01-02 (×2): 1 [IU] via SUBCUTANEOUS
  Administered 2018-01-02 – 2018-01-03 (×3): 2 [IU] via SUBCUTANEOUS
  Administered 2018-01-04: 1 [IU] via SUBCUTANEOUS

## 2017-12-31 MED ORDER — SODIUM CHLORIDE 0.9% FLUSH
3.0000 mL | Freq: Two times a day (BID) | INTRAVENOUS | Status: DC
  Administered 2018-01-01 – 2018-01-04 (×7): 3 mL via INTRAVENOUS

## 2017-12-31 MED ORDER — ASPIRIN EC 81 MG PO TBEC
81.0000 mg | DELAYED_RELEASE_TABLET | Freq: Every day | ORAL | Status: DC
  Administered 2018-01-01 – 2018-01-04 (×4): 81 mg via ORAL
  Filled 2017-12-31 (×4): qty 1

## 2017-12-31 MED ORDER — POTASSIUM CHLORIDE CRYS ER 20 MEQ PO TBCR
20.0000 meq | EXTENDED_RELEASE_TABLET | Freq: Every day | ORAL | Status: DC
  Administered 2018-01-01 – 2018-01-04 (×4): 20 meq via ORAL
  Filled 2017-12-31 (×4): qty 1

## 2017-12-31 MED ORDER — ALBUTEROL SULFATE (2.5 MG/3ML) 0.083% IN NEBU
2.5000 mg | INHALATION_SOLUTION | RESPIRATORY_TRACT | Status: DC | PRN
  Administered 2018-01-01 – 2018-01-04 (×6): 2.5 mg via RESPIRATORY_TRACT
  Filled 2017-12-31 (×6): qty 3

## 2017-12-31 MED ORDER — DABIGATRAN ETEXILATE MESYLATE 150 MG PO CAPS
150.0000 mg | ORAL_CAPSULE | Freq: Two times a day (BID) | ORAL | Status: DC
  Administered 2018-01-01 – 2018-01-04 (×8): 150 mg via ORAL
  Filled 2017-12-31 (×10): qty 1

## 2017-12-31 MED ORDER — ALBUTEROL SULFATE HFA 108 (90 BASE) MCG/ACT IN AERS: 2.0000 | INHALATION_SPRAY | RESPIRATORY_TRACT | Status: DC | PRN

## 2017-12-31 MED ORDER — FUROSEMIDE 10 MG/ML IJ SOLN
40.0000 mg | Freq: Once | INTRAMUSCULAR | Status: AC
  Administered 2017-12-31: 40 mg via INTRAVENOUS
  Filled 2017-12-31: qty 4

## 2017-12-31 NOTE — ED Notes (Signed)
EMERGENCY DEPARTMENT  US GUIDANCE EXAM Emergency Ultrasound:  US Guidance for Needle Guidance  INDICATIONS: Difficult vascular access with blood draw, attempted by 2 prior RN Linear probe used in real-time to visualize location of needle entry through skin.   PERFORMED BY: myself IMAGES ARCHIVED?: no LIMITATIONS: none VIEWS USED: Transverse INTERPRETATION: Needle visualized within vein   Tolerated well w/no complications.   Aaron Edelman RN  12/31/17 6:07 PM

## 2017-12-31 NOTE — ED Provider Notes (Signed)
[facility] EMERGENCY DEPARTMENT Provider Note   CSN: [id number] Arrival date & time: 12/31/17  1503     History   Chief Complaint Chief Complaint  Patient presents with  . Pulmonary Hypertension  . Leg Swelling    HPI Lisa Walters is a 77 y.o. female w PMHx DM, CHF w preserved EF, severe pulmonary HTN, persistent asthma, a fib, HTN, sent to ED from [facility] for worsening DOE, LE edema, and oxygen requirement. Per Dr. Virgina Jock, pt needs admission for IV diuresis and right heart cath that is scheduled for tomorrow. He states she needs medical admission for management of other co-morbidities.  Per patient, she reports worsening SOB on exertion since Friday, CP on exertion, cough, and significant LE edema. Also reports lightheadedness with exertion, stating she needs to sit down or she feels like she will pass out. She states she has been requiring an extra liter of O2 at home as well. States she feels fine at rest, but with any exertion the symptoms occur.  The history is provided by the patient (and Cardiologist, Dr. Virgina Jock).    Past Medical History:  Diagnosis Date  . Anemia   . Arthritis    "all over" (06/02/2013)  . Asthma   . Breast cancer ([place]) 2007   left  . CHF (congestive heart failure) ([place])   . Chronic bronchitis ([place])   . Chronic lower back pain   . Distal radial fracture 05/26/2013   right "fell" (06/02/2013)  . Exertional shortness of breath    "most of the time" (06/02/2013)  . GERD (gastroesophageal reflux disease)   . H/O hiatal hernia   . History of blood transfusion 2007   "related to breast cancer" (06/02/2013)  . Hyperlipidemia   . Hypertension   . Irregular heart beat   . Migraines    "years ago" (06/02/2013)  . On home oxygen therapy    "2L at night when I go to bed" (06/03/2103)  . Osteopenia   . Pneumonia    "walking pneumonia once; hospitalized 2nd time I had pneumonia" (06/02/2013)  . Sleep apnea   . Type II diabetes  mellitus ([place])   . Urge incontinence   . Vertigo, benign positional   . Vitamin D deficiency disease     Patient Active Problem List   Diagnosis Date Noted  . Pulmonary hypertension ([place]) 09/23/2017  . Asthma 07/26/2017  . Congestive heart failure (CHF) ([place]) 07/26/2017  . OSA on CPAP 07/26/2017  . Atrial fibrillation ([place]) 07/26/2017  . Chronic respiratory failure with hypoxia ([place]) 07/26/2017  . Hyperlipidemia 07/26/2017  . GERD (gastroesophageal reflux disease) 07/26/2017  . Diabetes mellitus ([place]) 07/26/2017  . Osteopenia   . Vitamin D deficiency disease   . Breast cancer ([place])   . Irregular heart beat     Past Surgical History:  Procedure Laterality Date  . BREAST BIOPSY Left 2007  . BREAST LUMPECTOMY Left 2007  . CARDIAC CATHETERIZATION  07/2012  . CATARACT EXTRACTION W/ INTRAOCULAR LENS  IMPLANT, BILATERAL    . INTRAVASCULAR PRESSURE WIRE/FFR STUDY N/A 09/25/2017   Procedure: INTRAVASCULAR PRESSURE WIRE/FFR STUDY;  Surgeon: Nigel Mormon, MD;  Location: [facility];  Service: Cardiovascular;  Laterality: N/A;  . JOINT REPLACEMENT    . KNEE ARTHROSCOPY Left   . LEFT HEART CATHETERIZATION WITH CORONARY ANGIOGRAM Right 08/02/2012   Procedure: LEFT HEART CATHETERIZATION WITH CORONARY ANGIOGRAM;  Surgeon: Birdie Riddle, MD;  Location: [facility];  Service: Cardiovascular;  Laterality: Right;  . MEDIAL PARTIAL KNEE REPLACEMENT Left   . RIGHT/LEFT HEART CATH AND CORONARY ANGIOGRAPHY N/A 09/25/2017   Procedure: RIGHT/LEFT HEART CATH AND CORONARY ANGIOGRAPHY;  Surgeon: Nigel Mormon, MD;  Location: [facility];  Service: Cardiovascular;  Laterality: N/A;  . TOTAL KNEE ARTHROPLASTY Bilateral   . TUBAL LIGATION      OB History    Gravida Para Term Preterm AB Living   4 4 4     4    SAB TAB Ectopic Multiple Live Births                   Home Medications    Prior to Admission medications   Medication Sig Start Date End Date Taking? Authorizing  Provider  acetaminophen (TYLENOL) 500 MG tablet Take 500-1,000 mg by mouth every 6 (six) hours as needed (back).   Yes [provider]  albuterol (PROVENTIL HFA;VENTOLIN HFA) 108 (90 Base) MCG/ACT inhaler Inhale 2 puffs into the lungs every 4 (four) hours as needed for wheezing or shortness of breath. 09/19/17  Yes Javier Glazier, MD  albuterol (PROVENTIL) (5 MG/ML) 0.5% nebulizer solution Take 2.5 mg by nebulization every 6 (six) hours as needed for wheezing or shortness of breath.    Yes [provider]  atorvastatin (LIPITOR) 20 MG tablet Take 20 mg by mouth daily at 6 PM.  06/25/17  Yes [provider]  budesonide-formoterol (SYMBICORT) 160-4.5 MCG/ACT inhaler Inhale 2 puffs into the lungs 2 (two) times daily. 07/26/17  Yes Javier Glazier, MD  cholecalciferol (VITAMIN D) 1000 UNITS tablet Take 1,000 Units by mouth daily.   Yes [provider]  furosemide (LASIX) 40 MG tablet Take 40 mg by mouth 3 (three) times daily.    Yes [provider]  losartan (COZAAR) 50 MG tablet Take 50 mg by mouth daily.   Yes [provider]  meclizine (ANTIVERT) 12.5 MG tablet Take 12.5 mg by mouth every 6 (six) hours as needed for dizziness (for vertigo).    Yes [provider]  metFORMIN (GLUCOPHAGE) 500 MG tablet Take 500 mg by mouth 2 (two) times daily with a meal.   Yes [provider]  metoprolol (TOPROL-XL) 200 MG 24 hr tablet Take 200 mg by mouth daily. 12/17/17  Yes [provider]  montelukast (SINGULAIR) 10 MG tablet Take 10 mg by mouth every morning.    Yes [provider]  potassium chloride SA (K-DUR,KLOR-CON) 20 MEQ tablet Take 20 mEq by mouth daily.   Yes [provider]  PRADAXA 150 MG CAPS capsule Take 150 mg by mouth 2 (two) times daily.  06/14/17  Yes [provider]  ranitidine (ZANTAC) 150 MG tablet Take 1 tablet (150 mg total) by mouth at bedtime. 10/22/17  Yes Javier Glazier, MD    budesonide (PULMICORT) 0.25 MG/2ML nebulizer solution Take 2 mLs (0.25 mg total) by nebulization 2 (two) times daily. DX: J45.909 Patient not taking: Reported on 12/31/2017 10/26/17   Javier Glazier, MD  formoterol (PERFOROMIST) 20 MCG/2ML nebulizer solution Take 2 mLs (20 mcg total) by nebulization 2 (two) times daily. Patient not taking: Reported on 12/31/2017 10/22/17   Javier Glazier, MD  ranitidine (ZANTAC) 150 MG tablet Take 1 tablet (150 mg total) by mouth at bedtime. Patient not taking: Reported on 12/31/2017 10/22/17   Javier Glazier, MD    Family History Family History  Problem Relation Age of Onset  . Stroke Father   .  Emphysema Brother   . Cancer Sister        brain tumor  . Kidney disease Mother   . Asthma Brother     Social History Social History   Tobacco Use  . Smoking status: Former Smoker    Packs/day: 0.10    Years: 5.00    Pack years: 0.50    Types: Cigarettes    Last attempt to quit: 12/25/1960    Years since quitting: 57.0  . Smokeless tobacco: Never Used  Substance Use Topics  . Alcohol use: Yes    Comment: 06/02/2013 "quit drinking ~ 35 yr ago; never had problem w/it"  . Drug use: No     Allergies   Codeine and Sulfa antibiotics   Review of Systems Review of Systems  Constitutional: Negative for fever.  Respiratory: Positive for cough and shortness of breath.   Cardiovascular: Positive for chest pain and leg swelling.  Gastrointestinal: Negative for abdominal pain.  Neurological: Positive for light-headedness. Negative for syncope.  All other systems reviewed and are negative.    Physical Exam Updated Vital Signs BP 119/79   Pulse 87   Temp 97.6 F (36.4 C) (Oral)   Resp 20   Ht 5\' 3"  (1.6 m)   Wt 89.8 kg (198 lb)   SpO2 98%   BMI 35.07 kg/m   Physical Exam  Constitutional: She appears well-developed and well-nourished. No distress.  HENT:  Head: Normocephalic and atraumatic.  Eyes: Conjunctivae are normal.   Cardiovascular: Normal rate, normal heart sounds and intact distal pulses. An irregularly irregular rhythm present.  Pulmonary/Chest: Effort normal.  Decreased breath sounds bilaterally.  No rales  Abdominal: Soft. Bowel sounds are normal. She exhibits no distension. There is no tenderness.  Musculoskeletal:  Significant BLE pitting edema. No erythema or warmth  Neurological: She is alert.  Skin: Skin is warm.  Psychiatric: She has a normal mood and affect. Her behavior is normal.  Nursing note and vitals reviewed.    ED Treatments / Results  Labs (all labs ordered are listed, but only abnormal results are displayed) Labs Reviewed  BASIC METABOLIC PANEL - Abnormal; Notable for the following components:      Result Value   Chloride 100 (*)    Creatinine, Ser 1.18 (*)    GFR calc non Af Amer 44 (*)    GFR calc Af Amer 51 (*)    All other components within normal limits  CBC WITH DIFFERENTIAL/PLATELET - Abnormal; Notable for the following components:   RBC 3.57 (*)    Hemoglobin 9.2 (*)    HCT 30.0 (*)    MCH 25.8 (*)    RDW 18.4 (*)    All other components within normal limits  BRAIN NATRIURETIC PEPTIDE - Abnormal; Notable for the following components:   B Natriuretic Peptide 375.3 (*)    All other components within normal limits  TROPONIN I    EKG  EKG Interpretation  Date/Time:  Monday December 31 2017 17:12:16 EST Ventricular Rate:  92 PR Interval:    QRS Duration: 82 QT Interval:  444 QTC Calculation: 535 R Axis:   75 Text Interpretation:  Atrial fibrillation Low voltage, precordial leads Nonspecific T abnormalities, diffuse leads Prolonged QT interval no significant change since Nov 2018 Confirmed by Sherwood Gambler [phone]) on 12/31/2017 7:01:23 PM       Radiology Dg Chest 2 View  Result Date: 12/31/2017 CLINICAL DATA:  Mid chest pain with shortness of breath and dizziness with exertion. History of  CHF, asthma-chronic bronchitis. Remote history of smoking.  EXAM: CHEST  2 VIEW COMPARISON:  Chest x-ray dated November 18, 2017 FINDINGS: The lungs are adequately inflated. There is no focal infiltrate. There is no pleural effusion. The cardiac silhouette is enlarged but stable. The pulmonary vascularity is not engorged. There is calcification in the wall of the aortic arch. There surgical clips in the left axillary region. There is multilevel degenerative disc disease of the thoracic spine with mild compression of the body of T9 which appears stable. IMPRESSION: Chronic bronchitic changes. No pneumonia nor pulmonary edema. Stable cardiomegaly. Thoracic aortic atherosclerosis. Electronically Signed   By: David  [place] M.D.   On: 12/31/2017 16:48    Procedures Procedures (including critical care time)  Medications Ordered in ED Medications  furosemide (LASIX) injection 40 mg (not administered)  furosemide (LASIX) injection 40 mg (40 mg Intravenous Given 12/31/17 1739)     Initial Impression / Assessment and Plan / ED Course  I have reviewed the triage vital signs and the nursing notes.  Pertinent labs & imaging results that were available during my care of the patient were reviewed by me and considered in my medical decision making (see chart for details).  Clinical Course as of Dec 31 2105  Molli Knock Dec 31, 2017  2103 Dr. Shanon Brow with [facility] accepting admission.  [JR]    Clinical Course User Index [JR] Robinson, [place] N, PA-C    Pt with pulmonary HTN, CHF w preserved EF, persistent asthma, sent to ED from Cardiologist office, Dr. Virgina Jock, for worsening dyspnea on exertion, LE, increasing O2 requirement, CP on exertion and lightheadedness on exertion. His plan is for IV diuresis and right heart cath tomorrow. Pt stable at rest without increased work of breathing, O2 sat in mid 90s on 2L [place]. Significant BLE pitting edema noted on exam, lungs with dec breath sounds bilaterally but no rales. Will obtain labs, BNP, CXR, EKG, and admit to medicine for  management of comorbidities.   Trop neg. BNP 375.3, Cr 1.18. Dr Shanon Brow with [facility] accepting admission.  Patient discussed with and seen by Dr. Regenia Skeeter.  The patient appears reasonably stabilized for admission considering the current resources, flow, and capabilities available in the ED at this time, and I doubt any other [facility] requiring further screening and/or treatment in the ED prior to admission.  Final Clinical Impressions(s) / ED Diagnoses   Final diagnoses:  Pulmonary hypertension, moderate to severe [facility])    ED Discharge Orders    None       Robinson, [place] N, PA-C 12/31/17 2107    Sherwood Gambler, MD 01/02/18 [phone]

## 2017-12-31 NOTE — Consult Note (Addendum)
Reason for Consult: Shortness of breath  Referring Physician: [facility]  Lisa Walters is an 77 y.o. female.  HPI:   77 year old African-American female with hypertension, hyperlipidemia, type II diabetes mellitus, persistent Afib (CHA2DS2VASc score 4), on pradaxa, moderate obesity, severe persistent asthma on home oxygen, WHO group II/III pulmonary hypertension FC II-III. I saw the patient in the office earlier today.  She has had significant worsening of her dyspnea, now class III to class IV symptoms, as well as peripheral edema.  She continues to be on home oxygen.  She denies any chest pain, presyncope or syncope, palpitations.  Given her severity of symptoms, I recommended hospital admission.  Past Medical History:  Diagnosis Date  . Anemia   . Arthritis    "all over" (06/02/2013)  . Asthma   . Breast cancer ([place]) 2007   left  . CHF (congestive heart failure) ([place])   . Chronic bronchitis ([place])   . Chronic lower back pain   . Distal radial fracture 05/26/2013   right "fell" (06/02/2013)  . Exertional shortness of breath    "most of the time" (06/02/2013)  . GERD (gastroesophageal reflux disease)   . H/O hiatal hernia   . History of blood transfusion 2007   "related to breast cancer" (06/02/2013)  . Hyperlipidemia   . Hypertension   . Irregular heart beat   . Migraines    "years ago" (06/02/2013)  . On home oxygen therapy    "2L at night when I go to bed" (06/03/2103)  . Osteopenia   . Pneumonia    "walking pneumonia once; hospitalized 2nd time I had pneumonia" (06/02/2013)  . Sleep apnea   . Type II diabetes mellitus ([place])   . Urge incontinence   . Vertigo, benign positional   . Vitamin D deficiency disease     Past Surgical History:  Procedure Laterality Date  . BREAST BIOPSY Left 2007  . BREAST LUMPECTOMY Left 2007  . CARDIAC CATHETERIZATION  07/2012  . CATARACT EXTRACTION W/ INTRAOCULAR LENS  IMPLANT, BILATERAL    . INTRAVASCULAR PRESSURE WIRE/FFR STUDY N/A 09/25/2017   Procedure: INTRAVASCULAR PRESSURE WIRE/FFR STUDY;  Surgeon: Nigel Mormon, MD;  Location: [facility];  Service: Cardiovascular;  Laterality: N/A;  . JOINT REPLACEMENT    . KNEE ARTHROSCOPY Left   . LEFT HEART CATHETERIZATION WITH CORONARY ANGIOGRAM Right 08/02/2012   Procedure: LEFT HEART CATHETERIZATION WITH CORONARY ANGIOGRAM;  Surgeon: Birdie Riddle, MD;  Location: [facility];  Service: Cardiovascular;  Laterality: Right;  . MEDIAL PARTIAL KNEE REPLACEMENT Left   . RIGHT/LEFT HEART CATH AND CORONARY ANGIOGRAPHY N/A 09/25/2017   Procedure: RIGHT/LEFT HEART CATH AND CORONARY ANGIOGRAPHY;  Surgeon: Nigel Mormon, MD;  Location: [facility];  Service: Cardiovascular;  Laterality: N/A;  . TOTAL KNEE ARTHROPLASTY Bilateral   . TUBAL LIGATION      Family History  Problem Relation Age of Onset  . Stroke Father   . Emphysema Brother   . Cancer Sister        brain tumor  . Kidney disease Mother   . Asthma Brother     Social History:  reports that she quit smoking about 57 years ago. Her smoking use included cigarettes. She has a 0.50 pack-year smoking history. she has never used smokeless tobacco. She reports that she drinks alcohol. She reports that she does not use drugs.  Allergies:  Allergies  Allergen Reactions  . Codeine Other (See Comments)  Unknown  . Sulfa Antibiotics Other (See Comments)    Unknown    Medications: I have reviewed the patient's current medications.  No results found for this or any previous visit (from the past 48 hour(s)).  No results found.  Review of Systems  Constitutional: Negative for malaise/fatigue.  Eyes: Negative.   Respiratory: Positive for shortness of breath.   Cardiovascular: Positive for leg swelling. Negative for chest pain and palpitations.  Gastrointestinal: Negative for abdominal pain and blood in stool.  Genitourinary: Negative.   Skin: Negative.   Neurological: Negative for loss of consciousness and  headaches.  Endo/Heme/Allergies: Does not bruise/bleed easily.  Psychiatric/Behavioral: The patient is nervous/anxious.   All other systems reviewed and are negative.  Blood pressure 103/69, pulse 86, temperature 97.6 F (36.4 C), temperature source Oral, resp. rate (!) 21, height 5\' 3"  (1.6 m), weight 89.8 kg (198 lb), SpO2 98 %. Physical Exam  Nursing note and vitals reviewed. Constitutional: She is oriented to person, place, and time. She appears well-developed and well-nourished.  HENT:  Head: Atraumatic.  Eyes: Pupils are equal, round, and reactive to light.  Neck: JVD present.  Cardiovascular: Normal rate.  Murmur (II/VI holosystolic murmur RLSB) heard. Respiratory: Breath sounds normal. She is in respiratory distress. She has no wheezes.  GI: Soft. Bowel sounds are normal. There is no tenderness.  Musculoskeletal: Normal range of motion. She exhibits edema (3+ b/l).  Neurological: She is alert and oriented to person, place, and time.  Skin: Skin is dry.    Assessment:  Acute on chronic cor pulmonale Pulmonary hypertension WHO Grp II/III FC III Peristent Afib CHA2DS2VASc score 4 Severe persistent asthma Hypertension Type 2 diabetes mellitus Hyperlipidemia History of breast cancer Moderate obesity  Recommendations: Admit to stepdown unit.  IV diuresis with IV Lasix 40 mg q. 6 hour period daily weights and strict intake and output. Will plan on performing Right heart cath after adequate diuresis, to establish if the patient has any component of WHO Grp 1 pulmonary arterial hypertension. Continue metoprolol succinate 200 mg once daily, pradaxa. Management of chronic medical comorbidities as per hospitalist service.  Appreciate their help.   Brynnly Bonet J Dionysios Massman 12/31/2017, 3:48 PM    Aerianna Losey Esther Hardy, MD [facility] Pager: [phone] Office: [phone] If no answer Cell [phone]

## 2017-12-31 NOTE — ED Triage Notes (Signed)
Per EMS: Pt from [facility] with pain and sob on exertion.  Cardiologist sent pt due to pulmonary hypertension.  Plan for a Cath placement tomorrow per EMS.  Pt also has bilateral lower extremity swelling.  PTA vitals:  BP 113/62, HR 81, RR 18-25.

## 2017-12-31 NOTE — ED Notes (Signed)
Ordered pt a dinner tray 

## 2017-12-31 NOTE — H&P (Signed)
History and Physical    Lisa Walters UKG:[id number] DOB: March 04, 1941 DOA: 12/31/2017  PCP: Benito Mccreedy, MD  Patient coming from:  home  Chief Complaint:  Sob and swelling  HPI: Lisa Walters is a 77 y.o. female with medical history significant of diastolic CHF, afib on pradaxa, asthma, crf on home oxygen, comes in with 3 days of worsening doe and le edema.  No fevers.  No cough.  No chest pain.  She went to see her cardiologist today who recommended admission for RHC in am and diuresis.  Her lasix orally 40mg  was just increased to tid last week and has not helped.  She is compliant with meds   Review of Systems: As per HPI otherwise 10 point review of systems negative.   Past Medical History:  Diagnosis Date  . Anemia   . Arthritis    "all over" (06/02/2013)  . Asthma   . Breast cancer ([place]) 2007   left  . CHF (congestive heart failure) ([place])   . Chronic bronchitis ([place])   . Chronic lower back pain   . Distal radial fracture 05/26/2013   right "fell" (06/02/2013)  . Exertional shortness of breath    "most of the time" (06/02/2013)  . GERD (gastroesophageal reflux disease)   . H/O hiatal hernia   . History of blood transfusion 2007   "related to breast cancer" (06/02/2013)  . Hyperlipidemia   . Hypertension   . Irregular heart beat   . Migraines    "years ago" (06/02/2013)  . On home oxygen therapy    "2L at night when I go to bed" (06/03/2103)  . Osteopenia   . Pneumonia    "walking pneumonia once; hospitalized 2nd time I had pneumonia" (06/02/2013)  . Sleep apnea   . Type II diabetes mellitus ([place])   . Urge incontinence   . Vertigo, benign positional   . Vitamin D deficiency disease     Past Surgical History:  Procedure Laterality Date  . BREAST BIOPSY Left 2007  . BREAST LUMPECTOMY Left 2007  . CARDIAC CATHETERIZATION  07/2012  . CATARACT EXTRACTION W/ INTRAOCULAR LENS  IMPLANT, BILATERAL    . INTRAVASCULAR PRESSURE WIRE/FFR STUDY N/A 09/25/2017   Procedure:  INTRAVASCULAR PRESSURE WIRE/FFR STUDY;  Surgeon: Nigel Mormon, MD;  Location: [facility];  Service: Cardiovascular;  Laterality: N/A;  . JOINT REPLACEMENT    . KNEE ARTHROSCOPY Left   . LEFT HEART CATHETERIZATION WITH CORONARY ANGIOGRAM Right 08/02/2012   Procedure: LEFT HEART CATHETERIZATION WITH CORONARY ANGIOGRAM;  Surgeon: Birdie Riddle, MD;  Location: [facility];  Service: Cardiovascular;  Laterality: Right;  . MEDIAL PARTIAL KNEE REPLACEMENT Left   . RIGHT/LEFT HEART CATH AND CORONARY ANGIOGRAPHY N/A 09/25/2017   Procedure: RIGHT/LEFT HEART CATH AND CORONARY ANGIOGRAPHY;  Surgeon: Nigel Mormon, MD;  Location: [facility];  Service: Cardiovascular;  Laterality: N/A;  . TOTAL KNEE ARTHROPLASTY Bilateral   . TUBAL LIGATION       reports that she quit smoking about 57 years ago. Her smoking use included cigarettes. She has a 0.50 pack-year smoking history. she has never used smokeless tobacco. She reports that she drinks alcohol. She reports that she does not use drugs.  Allergies  Allergen Reactions  . Codeine Other (See Comments)    Unknown  . Sulfa Antibiotics Other (See Comments)    Unknown    Family History  Problem Relation Age of Onset  . Stroke Father   .  Emphysema Brother   . Cancer Sister        brain tumor  . Kidney disease Mother   . Asthma Brother     Prior to Admission medications   Medication Sig Start Date End Date Taking? Authorizing Provider  acetaminophen (TYLENOL) 500 MG tablet Take 500-1,000 mg by mouth every 6 (six) hours as needed (back).   Yes [provider]  albuterol (PROVENTIL HFA;VENTOLIN HFA) 108 (90 Base) MCG/ACT inhaler Inhale 2 puffs into the lungs every 4 (four) hours as needed for wheezing or shortness of breath. 09/19/17  Yes Javier Glazier, MD  albuterol (PROVENTIL) (5 MG/ML) 0.5% nebulizer solution Take 2.5 mg by nebulization every 6 (six) hours as needed for wheezing or shortness of breath.    Yes  [provider]  atorvastatin (LIPITOR) 20 MG tablet Take 20 mg by mouth daily at 6 PM.  06/25/17  Yes [provider]  budesonide-formoterol (SYMBICORT) 160-4.5 MCG/ACT inhaler Inhale 2 puffs into the lungs 2 (two) times daily. 07/26/17  Yes Javier Glazier, MD  cholecalciferol (VITAMIN D) 1000 UNITS tablet Take 1,000 Units by mouth daily.   Yes [provider]  furosemide (LASIX) 40 MG tablet Take 40 mg by mouth 3 (three) times daily.    Yes [provider]  losartan (COZAAR) 50 MG tablet Take 50 mg by mouth daily.   Yes [provider]  meclizine (ANTIVERT) 12.5 MG tablet Take 12.5 mg by mouth every 6 (six) hours as needed for dizziness (for vertigo).    Yes [provider]  metFORMIN (GLUCOPHAGE) 500 MG tablet Take 500 mg by mouth 2 (two) times daily with a meal.   Yes [provider]  metoprolol (TOPROL-XL) 200 MG 24 hr tablet Take 200 mg by mouth daily. 12/17/17  Yes [provider]  montelukast (SINGULAIR) 10 MG tablet Take 10 mg by mouth every morning.    Yes [provider]  potassium chloride SA (K-DUR,KLOR-CON) 20 MEQ tablet Take 20 mEq by mouth daily.   Yes [provider]  PRADAXA 150 MG CAPS capsule Take 150 mg by mouth 2 (two) times daily.  06/14/17  Yes [provider]  ranitidine (ZANTAC) 150 MG tablet Take 1 tablet (150 mg total) by mouth at bedtime. 10/22/17  Yes Javier Glazier, MD  budesonide (PULMICORT) 0.25 MG/2ML nebulizer solution Take 2 mLs (0.25 mg total) by nebulization 2 (two) times daily. DX: J45.909 Patient not taking: Reported on 12/31/2017 10/26/17   Javier Glazier, MD  formoterol (PERFOROMIST) 20 MCG/2ML nebulizer solution Take 2 mLs (20 mcg total) by nebulization 2 (two) times daily. Patient not taking: Reported on 12/31/2017 10/22/17   Javier Glazier, MD  ranitidine (ZANTAC) 150 MG tablet Take 1 tablet (150 mg total) by mouth at bedtime. Patient not taking:  Reported on 12/31/2017 10/22/17   Javier Glazier, MD    Physical Exam: Vitals:   12/31/17 2045 12/31/17 2115 12/31/17 2130 12/31/17 2200  BP: 119/79 112/73 (!) 102/59 102/65  Pulse:   84 83  Resp: 20 (!) 24 (!) 21 16  Temp:      TempSrc:      SpO2:   99% 100%  Weight:      Height:          Constitutional: NAD, calm, comfortable Vitals:   12/31/17 2045 12/31/17 2115 12/31/17 2130 12/31/17 2200  BP: 119/79 112/73 (!) 102/59 102/65  Pulse:   84 83  Resp: 20 (!) 24 (!)  21 16  Temp:      TempSrc:      SpO2:   99% 100%  Weight:      Height:       Eyes: PERRL, lids and conjunctivae normal ENMT: Mucous membranes are moist. Posterior pharynx clear of any exudate or lesions.Normal dentition.  Neck: normal, supple, no masses, no thyromegaly Respiratory: clear to auscultation bilaterally, no wheezing, no crackles. Normal respiratory effort. No accessory muscle use.  Cardiovascular: Regular rate and rhythm, no murmurs / rubs / gallops. 2+ extremity edema. 2+ pedal pulses. No carotid bruits.  Abdomen: no tenderness, no masses palpated. No hepatosplenomegaly. Bowel sounds positive.  Musculoskeletal: no clubbing / cyanosis. No joint deformity upper and lower extremities. Good ROM, no contractures. Normal muscle tone.  Skin: no rashes, lesions, ulcers. No induration Neurologic: CN 2-12 grossly intact. Sensation intact, DTR normal. Strength 5/5 in all 4.  Psychiatric: Normal judgment and insight. Alert and oriented x 3. Normal mood.    Labs on Admission: I have personally reviewed following labs and imaging studies  CBC: Recent Labs  Lab 12/31/17 1800  WBC 5.1  NEUTROABS 3.2  HGB 9.2*  HCT 30.0*  MCV 84.0  PLT 932   Basic Metabolic Panel: Recent Labs  Lab 12/31/17 1800  NA 137  K 4.0  CL 100*  CO2 27  GLUCOSE 99  BUN 20  CREATININE 1.18*  CALCIUM 8.9   GFR: Estimated Creatinine Clearance: 44.5 mL/min (A) (by C-G formula based on SCr of 1.18 mg/dL (H)). Liver  Function Tests: No results for input(s): AST, ALT, ALKPHOS, BILITOT, PROT, ALBUMIN in the last 168 hours. No results for input(s): LIPASE, AMYLASE in the last 168 hours. No results for input(s): AMMONIA in the last 168 hours. Coagulation Profile: No results for input(s): INR, PROTIME in the last 168 hours. Cardiac Enzymes: Recent Labs  Lab 12/31/17 1800  TROPONINI <0.03   BNP (last 3 results) Recent Labs    07/26/17 1103  PROBNP 597.0*   HbA1C: No results for input(s): HGBA1C in the last 72 hours. CBG: No results for input(s): GLUCAP in the last 168 hours. Lipid Profile: No results for input(s): CHOL, HDL, LDLCALC, TRIG, CHOLHDL, LDLDIRECT in the last 72 hours. Thyroid Function Tests: No results for input(s): TSH, T4TOTAL, FREET4, T3FREE, THYROIDAB in the last 72 hours. Anemia Panel: No results for input(s): VITAMINB12, FOLATE, FERRITIN, TIBC, IRON, RETICCTPCT in the last 72 hours. Urine analysis:    Component Value Date/Time   COLORURINE YELLOW 05/31/2013 0925   APPEARANCEUR CLOUDY (A) 05/31/2013 0925   LABSPEC 1.021 05/31/2013 0925   PHURINE 5.5 05/31/2013 0925   GLUCOSEU NEGATIVE 05/31/2013 0925   HGBUR TRACE (A) 05/31/2013 0925   BILIRUBINUR NEGATIVE 05/31/2013 0925   KETONESUR NEGATIVE 05/31/2013 0925   PROTEINUR NEGATIVE 05/31/2013 0925   UROBILINOGEN 1.0 05/31/2013 0925   NITRITE NEGATIVE 05/31/2013 0925   LEUKOCYTESUR MODERATE (A) 05/31/2013 0925   Sepsis Labs: !!!!!!!!!!!!!!!!!!!!!!!!!!!!!!!!!!!!!!!!!!!! @LABRCNTIP (procalcitonin:4,lacticidven:4) )No results found for this or any previous visit (from the past 240 hour(s)).   Radiological Exams on Admission: Dg Chest 2 View  Result Date: 12/31/2017 CLINICAL DATA:  Mid chest pain with shortness of breath and dizziness with exertion. History of CHF, asthma-chronic bronchitis. Remote history of smoking. EXAM: CHEST  2 VIEW COMPARISON:  Chest x-ray dated November 18, 2017 FINDINGS: The lungs are adequately  inflated. There is no focal infiltrate. There is no pleural effusion. The cardiac silhouette is enlarged but stable. The pulmonary vascularity is not engorged. There  is calcification in the wall of the aortic arch. There surgical clips in the left axillary region. There is multilevel degenerative disc disease of the thoracic spine with mild compression of the body of T9 which appears stable. IMPRESSION: Chronic bronchitic changes. No pneumonia nor pulmonary edema. Stable cardiomegaly. Thoracic aortic atherosclerosis. Electronically Signed   By: David  Martinique M.D.   On: 12/31/2017 16:48    EKG: Independently reviewed. afib cxr reviewed no edema or infiltrate Old chart reviewed Case discussed with edp  Assessment/Plan 77 yo female with acute on chronic chf exacerbation  Principal Problem:   CHF exacerbation (HCC)- increase lasix to 40mg  iv q 6 hours.  chf pathway.  RHC in am.  Npo after midnight.  Oxygen sats normal on 2 liters on RA.  Tele monitoring.  Active Problems:   Asthma- cont home breathing treatments   OSA on CPAP- noted   Atrial fibrillation (HCC)- rate controlled, cont metoprolol and pradaxa   Chronic respiratory failure with hypoxia ([place])- cont home o2 needs   Pulmonary hypertension ([place])- RHC in am    DVT prophylaxis:  pradaxa Code Status:  full Family Communication:  none Disposition Plan:  Per day team Consults called:  cardiology Admission status:  observation   DAVID,RACHAL A MD [facility]  If 7PM-7AM, please contact night-coverage [email or web address] Password [email or web address]  12/31/2017, 10:05 PM

## 2018-01-01 ENCOUNTER — Ambulatory Visit (HOSPITAL_COMMUNITY): Admission: RE | Admit: 2018-01-01 | Payer: Medicare Other | Source: Ambulatory Visit | Admitting: Cardiology

## 2018-01-01 ENCOUNTER — Encounter (HOSPITAL_COMMUNITY)
Admission: EM | Disposition: A | Discharge status: Home Health | Payer: Self-pay | Source: Home / Self Care | Attending: Internal Medicine

## 2018-01-01 ENCOUNTER — Other Ambulatory Visit: Payer: Self-pay

## 2018-01-01 DIAGNOSIS — I110 Hypertensive heart disease with heart failure: Secondary | ICD-10-CM | POA: Diagnosis present

## 2018-01-01 DIAGNOSIS — I2721 Secondary pulmonary arterial hypertension: Secondary | ICD-10-CM | POA: Diagnosis present

## 2018-01-01 DIAGNOSIS — I481 Persistent atrial fibrillation: Secondary | ICD-10-CM | POA: Diagnosis present

## 2018-01-01 DIAGNOSIS — I482 Chronic atrial fibrillation: Secondary | ICD-10-CM

## 2018-01-01 DIAGNOSIS — R0602 Shortness of breath: Secondary | ICD-10-CM | POA: Diagnosis present

## 2018-01-01 DIAGNOSIS — H8110 Benign paroxysmal vertigo, unspecified ear: Secondary | ICD-10-CM | POA: Diagnosis present

## 2018-01-01 DIAGNOSIS — G4733 Obstructive sleep apnea (adult) (pediatric): Secondary | ICD-10-CM

## 2018-01-01 DIAGNOSIS — I272 Pulmonary hypertension, unspecified: Secondary | ICD-10-CM | POA: Diagnosis not present

## 2018-01-01 DIAGNOSIS — J45909 Unspecified asthma, uncomplicated: Secondary | ICD-10-CM | POA: Diagnosis not present

## 2018-01-01 DIAGNOSIS — I480 Paroxysmal atrial fibrillation: Secondary | ICD-10-CM

## 2018-01-01 DIAGNOSIS — J9611 Chronic respiratory failure with hypoxia: Secondary | ICD-10-CM | POA: Diagnosis present

## 2018-01-01 DIAGNOSIS — I5033 Acute on chronic diastolic (congestive) heart failure: Secondary | ICD-10-CM

## 2018-01-01 DIAGNOSIS — M8580 Other specified disorders of bone density and structure, unspecified site: Secondary | ICD-10-CM | POA: Diagnosis present

## 2018-01-01 DIAGNOSIS — Z9989 Dependence on other enabling machines and devices: Secondary | ICD-10-CM

## 2018-01-01 DIAGNOSIS — E559 Vitamin D deficiency, unspecified: Secondary | ICD-10-CM | POA: Diagnosis present

## 2018-01-01 DIAGNOSIS — K219 Gastro-esophageal reflux disease without esophagitis: Secondary | ICD-10-CM | POA: Diagnosis present

## 2018-01-01 DIAGNOSIS — M1990 Unspecified osteoarthritis, unspecified site: Secondary | ICD-10-CM | POA: Diagnosis present

## 2018-01-01 DIAGNOSIS — I2510 Atherosclerotic heart disease of native coronary artery without angina pectoris: Secondary | ICD-10-CM | POA: Diagnosis present

## 2018-01-01 DIAGNOSIS — J4550 Severe persistent asthma, uncomplicated: Secondary | ICD-10-CM | POA: Diagnosis present

## 2018-01-01 DIAGNOSIS — E785 Hyperlipidemia, unspecified: Secondary | ICD-10-CM | POA: Diagnosis present

## 2018-01-01 DIAGNOSIS — Q2545 Double aortic arch: Secondary | ICD-10-CM | POA: Diagnosis not present

## 2018-01-01 DIAGNOSIS — Z961 Presence of intraocular lens: Secondary | ICD-10-CM | POA: Diagnosis present

## 2018-01-01 DIAGNOSIS — I2609 Other pulmonary embolism with acute cor pulmonale: Secondary | ICD-10-CM | POA: Diagnosis present

## 2018-01-01 DIAGNOSIS — E119 Type 2 diabetes mellitus without complications: Secondary | ICD-10-CM | POA: Diagnosis present

## 2018-01-01 DIAGNOSIS — Z6833 Body mass index (BMI) 33.0-33.9, adult: Secondary | ICD-10-CM | POA: Diagnosis not present

## 2018-01-01 DIAGNOSIS — Z9981 Dependence on supplemental oxygen: Secondary | ICD-10-CM | POA: Diagnosis not present

## 2018-01-01 DIAGNOSIS — G8929 Other chronic pain: Secondary | ICD-10-CM | POA: Diagnosis present

## 2018-01-01 DIAGNOSIS — Z96653 Presence of artificial knee joint, bilateral: Secondary | ICD-10-CM | POA: Diagnosis present

## 2018-01-01 LAB — BASIC METABOLIC PANEL
ANION GAP: 10 (ref 5–15)
BUN: 21 mg/dL — ABNORMAL HIGH (ref 6–20)
CALCIUM: 9.2 mg/dL (ref 8.9–10.3)
CO2: 30 mmol/L (ref 22–32)
CREATININE: 1.22 mg/dL — AB (ref 0.44–1.00)
Chloride: 100 mmol/L — ABNORMAL LOW (ref 101–111)
GFR, EST AFRICAN AMERICAN: 49 mL/min — AB (ref >60)
GFR, EST NON AFRICAN AMERICAN: 43 mL/min — AB (ref >60)
Glucose, Bld: 98 mg/dL (ref 65–99)
Potassium: 4.4 mmol/L (ref 3.5–5.1)
SODIUM: 140 mmol/L (ref 135–145)

## 2018-01-01 LAB — CBG MONITORING, ED
GLUCOSE-CAPILLARY: 113 mg/dL — AB (ref 65–99)
GLUCOSE-CAPILLARY: 136 mg/dL — AB (ref 65–99)

## 2018-01-01 LAB — GLUCOSE, CAPILLARY
Glucose-Capillary: 118 mg/dL — ABNORMAL HIGH (ref 65–99)
Glucose-Capillary: 189 mg/dL — ABNORMAL HIGH (ref 65–99)

## 2018-01-01 SURGERY — RIGHT HEART CATH
Anesthesia: LOCAL

## 2018-01-01 MED ORDER — FUROSEMIDE 10 MG/ML IJ SOLN
40.0000 mg | Freq: Two times a day (BID) | INTRAMUSCULAR | Status: DC
  Administered 2018-01-02 (×2): 40 mg via INTRAVENOUS
  Filled 2018-01-01 (×2): qty 4

## 2018-01-01 MED ORDER — ALBUTEROL SULFATE (2.5 MG/3ML) 0.083% IN NEBU
2.5000 mg | INHALATION_SOLUTION | Freq: Four times a day (QID) | RESPIRATORY_TRACT | Status: DC
  Administered 2018-01-02: 2.5 mg via RESPIRATORY_TRACT
  Filled 2018-01-01: qty 3

## 2018-01-01 NOTE — Progress Notes (Signed)
PROGRESS NOTE    Lisa Walters  HQP:[id number] DOB: 10-Jun-1941 DOA: 12/31/2017 PCP: Benito Mccreedy, MD    Brief Narrative:  77 y.o. female with medical history significant of diastolic CHF, afib on pradaxa, asthma, crf on home oxygen, comes in with 3 days of worsening doe and le edema.  No fevers.  No cough.  No chest pain.  She went to see her cardiologist today who recommended admission for RHC in am and diuresis.  Her lasix orally 40mg  was just increased to tid last week and has not helped.  She is compliant with meds  Assessment & Plan:   Principal Problem:   CHF exacerbation ([place]) Active Problems:   Asthma   OSA on CPAP   Atrial fibrillation (HCC)   Chronic respiratory failure with hypoxia (HCC)   Pulmonary hypertension ([place])   1. CHF exacerbation 1. Cardiology following 2. Initial plans for heart cath 1/8, however now on hold 3. Plan for continued diuresis as tolerated 2. Asthma 1. Currently on minimal O2 support 2. Stable at this time 3. No audible wheeizng 3. OSA 1. Stable at present 4. Afib 1. Rate controlled at present 2. Continue to monitor 3. Cont current regimen 5. Chronic respiratory failure 1. Currently stable on minimal O2 support 6. Pulm HTN 1. Stable at present  DVT prophylaxis: Pradaxa Code Status: Full Family Communication: Pt in room Disposition Plan: Uncertain at this time  Consultants:   Cardiology  Procedures:     Antimicrobials: Anti-infectives (From admission, onward)   None       Subjective: Without complaints  Objective: Vitals:   01/01/18 1145 01/01/18 1300 01/01/18 1400 01/01/18 1448  BP:  91/67 108/76 99/71  Pulse: 87 76 79 85  Resp: (!) 25 19 17    Temp:    98.2 F (36.8 C)  TempSrc:    Oral  SpO2: 93% (!) 89% 95% 92%  Weight:    86.4 kg (190 lb 9 oz)  Height:    5\' 3"  (1.6 m)    Intake/Output Summary (Last 24 hours) at 01/01/2018 1513 Last data filed at 01/01/2018 1402 Gross per 24 hour  Intake 3 ml    Output 1150 ml  Net -1147 ml   Filed Weights   12/31/17 1514 01/01/18 1448  Weight: 89.8 kg (198 lb) 86.4 kg (190 lb 9 oz)    Examination:  General exam: Appears calm and comfortable  Respiratory system: Clear to auscultation. Respiratory effort normal. Cardiovascular system: S1 & S2 heard, RRR Gastrointestinal system: Abdomen is nondistended, soft and nontender. No organomegaly or masses felt. Normal bowel sounds heard. Central nervous system: Alert and oriented. No focal neurological deficits. Extremities: Symmetric 5 x 5 power. Skin: No rashes, lesions Psychiatry: Judgement and insight appear normal. Mood & affect appropriate.   Data Reviewed: I have personally reviewed following labs and imaging studies  CBC: Recent Labs  Lab 12/31/17 1800  WBC 5.1  NEUTROABS 3.2  HGB 9.2*  HCT 30.0*  MCV 84.0  PLT 599   Basic Metabolic Panel: Recent Labs  Lab 12/31/17 1800 01/01/18 0357  NA 137 140  K 4.0 4.4  CL 100* 100*  CO2 27 30  GLUCOSE 99 98  BUN 20 21*  CREATININE 1.18* 1.22*  CALCIUM 8.9 9.2   GFR: Estimated Creatinine Clearance: 42.2 mL/min (A) (by C-G formula based on SCr of 1.22 mg/dL (H)). Liver Function Tests: No results for input(s): AST, ALT, ALKPHOS, BILITOT, PROT, ALBUMIN in the last 168 hours. No results for  input(s): LIPASE, AMYLASE in the last 168 hours. No results for input(s): AMMONIA in the last 168 hours. Coagulation Profile: No results for input(s): INR, PROTIME in the last 168 hours. Cardiac Enzymes: Recent Labs  Lab 12/31/17 1800  TROPONINI <0.03   BNP (last 3 results) Recent Labs    07/26/17 1103  PROBNP 597.0*   HbA1C: No results for input(s): HGBA1C in the last 72 hours. CBG: Recent Labs  Lab 01/01/18 0742 01/01/18 1200  GLUCAP 113* 136*   Lipid Profile: No results for input(s): CHOL, HDL, LDLCALC, TRIG, CHOLHDL, LDLDIRECT in the last 72 hours. Thyroid Function Tests: No results for input(s): TSH, T4TOTAL, FREET4,  T3FREE, THYROIDAB in the last 72 hours. Anemia Panel: No results for input(s): VITAMINB12, FOLATE, FERRITIN, TIBC, IRON, RETICCTPCT in the last 72 hours. Sepsis Labs: No results for input(s): PROCALCITON, LATICACIDVEN in the last 168 hours.  No results found for this or any previous visit (from the past 240 hour(s)).   Radiology Studies: Dg Chest 2 View  Result Date: 12/31/2017 CLINICAL DATA:  Mid chest pain with shortness of breath and dizziness with exertion. History of CHF, asthma-chronic bronchitis. Remote history of smoking. EXAM: CHEST  2 VIEW COMPARISON:  Chest x-ray dated November 18, 2017 FINDINGS: The lungs are adequately inflated. There is no focal infiltrate. There is no pleural effusion. The cardiac silhouette is enlarged but stable. The pulmonary vascularity is not engorged. There is calcification in the wall of the aortic arch. There surgical clips in the left axillary region. There is multilevel degenerative disc disease of the thoracic spine with mild compression of the body of T9 which appears stable. IMPRESSION: Chronic bronchitic changes. No pneumonia nor pulmonary edema. Stable cardiomegaly. Thoracic aortic atherosclerosis. Electronically Signed   By: David  Martinique M.D.   On: 12/31/2017 16:48    Scheduled Meds: . aspirin EC  81 mg Oral Daily  . atorvastatin  20 mg Oral q1800  . dabigatran  150 mg Oral BID  . fluticasone furoate-vilanterol  1 puff Inhalation Daily  . furosemide  40 mg Intravenous Q6H  . insulin aspart  0-9 Units Subcutaneous TID WC  . metoprolol  200 mg Oral Daily  . montelukast  10 mg Oral Q breakfast  . potassium chloride SA  20 mEq Oral Daily  . sodium chloride flush  3 mL Intravenous Q12H   Continuous Infusions: . sodium chloride       LOS: 0 days   Marylu Lund, MD [facility] Pager [phone]  If 7PM-7AM, please contact night-coverage [email or web address] Password [facility] 01/01/2018, 3:13 PM

## 2018-01-01 NOTE — ED Notes (Signed)
Medication requested from pharmacy.

## 2018-01-01 NOTE — Progress Notes (Signed)
Subjective:   77 year old African-American female with hypertension, hyperlipidemia, type II diabetes mellitus, persistent Afib (CHA2DS2VASc score 4), on pradaxa, moderate obesity, severe persistent asthma on home oxygen, WHO group II/III pulmonary hypertension FC II-III. I saw the patient in the office earlier today.  She has had significant worsening of her dyspnea, now class III to class IV symptoms, as well as peripheral edema.  She continues to be on home oxygen.  She denies any chest pain, presyncope or syncope, palpitations.  Given her severity of symptoms, I recommended hospital admission on 12/31/2017  Breathing improved with diuresis. Seen by heart failure specialist Dr. Haroldine Laws. Appreciate his help.     Objective:  Vital Signs in the last 24 hours: Temp:  [98.2 F (36.8 C)] 98.2 F (36.8 C) (01/08 1448) Pulse Rate:  [75-93] 85 (01/08 1448) Resp:  [13-25] 17 (01/08 1400) BP: (91-130)/(57-99) 99/71 (01/08 1448) SpO2:  [89 %-100 %] 92 % (01/08 1448) FiO2 (%):  [90 %] 90 % (01/08 1755) Weight:  [86.4 kg (190 lb 9 oz)] 86.4 kg (190 lb 9 oz) (01/08 1448)  Intake/Output from previous day: 01/07 0701 - 01/08 0700 In: -  Out: 550 [Urine:550] Intake/Output from this shift: Total I/O In: 3 [I.V.:3] Out: 1300 [Urine:1300]  Physical Exam: Nursing note and vitals reviewed. Constitutional: She is oriented to person, place, and time. She appears well-developed and well-nourished.  HENT:  Head: Atraumatic.  Eyes: Pupils are equal, round, and reactive to light.  Neck: JVD present.  Cardiovascular: Normal rate.  Murmur (II/VI holosystolic murmur RLSB) heard. Respiratory: Breath sounds normal. She is not in respiratory distress. She has no wheezes.  GI: Soft. Bowel sounds are normal. There is no tenderness.  Musculoskeletal: Normal range of motion. She exhibits edema (2+ b/l).  Neurological: She is alert and oriented to person, place, and time.  Skin: Skin is dry.      Lab  Results: Recent Labs    12/31/17 1800  WBC 5.1  HGB 9.2*  PLT 278   Recent Labs    12/31/17 1800 01/01/18 0357  NA 137 140  K 4.0 4.4  CL 100* 100*  CO2 27 30  GLUCOSE 99 98  BUN 20 21*  CREATININE 1.18* 1.22*   Recent Labs    12/31/17 1800  TROPONINI <0.03    Imaging: CXR 12/31/17: CLINICAL DATA:  Mid chest pain with shortness of breath and dizziness with exertion. History of CHF, asthma-chronic bronchitis. Remote history of smoking.  EXAM: CHEST  2 VIEW  COMPARISON:  Chest x-ray dated November 18, 2017  FINDINGS: The lungs are adequately inflated. There is no focal infiltrate. There is no pleural effusion. The cardiac silhouette is enlarged but stable. The pulmonary vascularity is not engorged. There is calcification in the wall of the aortic arch. There surgical clips in the left axillary region. There is multilevel degenerative disc disease of the thoracic spine with mild compression of the body of T9 which appears stable.  IMPRESSION: Chronic bronchitic changes. No pneumonia nor pulmonary edema. Stable cardiomegaly.  Thoracic aortic atherosclerosis.  Cardiac Studies: EKG 12/31/2017: Rate controlled atrial fibrillation. Low voltage. Nonspecific ST-T changes.   Assessment: Acute on chronic cor pulmonale Pulmonary hypertension WHO Grp II/III FC III Peristent Afib CHA2DS2VASc score 4 Severe persistent asthma Hypertension Type 2 diabetes mellitus Hyperlipidemia History of breast cancer Moderate obesity  Recommendations: Continue diuresis. Decrease to 40 mg IV bid.  Continue daily weights and strict intake and output.  Holding off right heart cath, as she  will not be a candidate for vasodilator therapy. Consider pulmonary consult.  Continue metoprolol succinate 200 mg once daily, pradaxa. Management of chronic medical comorbidities as per hospitalist service.  Appreciate their help.     LOS: 0 days    Manish J Patwardhan 01/01/2018,  6:08 PM  [place], MD [facility] Pager: [phone] Office: [phone] If no answer Cell [phone]

## 2018-01-01 NOTE — Consult Note (Signed)
Advanced Heart Failure Team Consult Note   Primary Physician: Primary Cardiologist:  Dr Einar Gip  Reason for Consultation: Heart Failure   HPI:    Lisa Walters is seen today for evaluation of heart failure at the request of Dr Einar Gip.   Lisa Walters is a 77 year old with history of hypertension, hyperlipidemia, DMII, left breast cancer with chemo/radiation,  chronic A fib, asthma, OSA uses CPAP,  on chronic home oxygen.  Followed in the community by Dr Einar Gip. 5-6 months ago she developed increased shortness of breath with exertion.  Started home oxygen and CPAP. Recently she has been having more difficulty with leg edema despite increasing lasix to 40 mg three times a day. Weight had gone down at home from 220 >198 pounds. Has presyncope last week. Lives alone.   Presented to [facility] ED with increased dyspnea and lower extremity edema. Pertinent admission labs include: K 4, creatinine 1.18, Hgb 9.2, BNP 375, and troponin <0.03. Diuresing with IV lasix. CXR with bronchitic changes. No pneumonia or pulmonary edema.   Feeling a little better.   Echo -[facility] ECHO pending.    RHC/LHC  09/2017  Mild nonobstructive CAD RA 19 PCWP 24 PVR 4,7  CO 5.3  CI 2.77  PFT 09/05/17: FVC 1.20 L (59%) FEV1 0.82 L (52%) FEV1/FVC 0.68 FEF 25-75 0.41 L (29%) negative bronchodilator response TLC 3.50 L (73%) RV 102% DLCO corrected 50% 06/08/17:FVC 0.95 L (45%) FEV1 0.72 L (45%) FEV1/FVC 0.76 FEF 25-75 0.57 L (33%)  CT of chest 2015- double aortic arch. No PE or ILD   TTE (07/11/17): LV normal in size with mild concentric hypertrophy. Normal wall motion. EF 55-60%.Diastolic function could not be assessed due to atrial fibrillation. LA moderately to severely dilated &RA moderately dilated. RV borderline dilation with normal function. No aortic regurgitation. Trace mitral regurgitation. Mild tricuspid regurgitation. Trace pulmonic regurgitation. No pericardial effusion. Aortic root normal in size. Normal  pulmonary artery size. Pulmonary artery systolic pressure 40 mmHg    Review of Systems: [y] = yes, [ ]  = no   General: Weight gain [Y ]; Weight loss [ ] ; Anorexia [ ] ; Fatigue [Y ]; Fever [ ] ; Chills [ ] ; Weakness [ ]   Cardiac: Chest pain/pressure [ ] ; Resting SOB [ ] ; Exertional SOB [Y ]; Orthopnea [ ] ; Pedal Edema [ ] ; Palpitations [ ] ; Syncope [ ] ; Presyncope [Y ]; Paroxysmal nocturnal dyspnea[ ]   Pulmonary: Cough [ ] ; Wheezing[ ] ; Hemoptysis[ ] ; Sputum [ ] ; Snoring [ ]   GI: Vomiting[ ] ; Dysphagia[ ] ; Melena[ ] ; Hematochezia [ ] ; Heartburn[ ] ; Abdominal pain [ ] ; Constipation [ ] ; Diarrhea [ ] ; BRBPR [ ]   GU: Hematuria[ ] ; Dysuria [ ] ; Nocturia[ ]   Vascular: Pain in legs with walking [ ] ; Pain in feet with lying flat [ ] ; Non-healing sores [ ] ; Stroke [ ] ; TIA [ ] ; Slurred speech [ ] ;  Neuro: Headaches[ ] ; Vertigo[ ] ; Seizures[ ] ; Paresthesias[ ] ;Blurred vision [ ] ; Diplopia [ ] ; Vision changes [ ]   Ortho/Skin: Arthritis [ ] ; Joint pain [Y ]; Muscle pain [ ] ; Joint swelling [ ] ; Back Pain [ ] ; Rash [ ]   Psych: Depression[ ] ; Anxiety[ ]   Heme: Bleeding problems [ ] ; Clotting disorders [ ] ; Anemia [ ]   Endocrine: Diabetes [ ] ; Thyroid dysfunction[ ]   Home Medications Prior to Admission medications   Medication Sig Start Date End Date Taking? Authorizing Provider  acetaminophen (TYLENOL) 500 MG tablet Take 500-1,000 mg by mouth every 6 (six)  hours as needed (back).   Yes [provider]  albuterol (PROVENTIL HFA;VENTOLIN HFA) 108 (90 Base) MCG/ACT inhaler Inhale 2 puffs into the lungs every 4 (four) hours as needed for wheezing or shortness of breath. 09/19/17  Yes Javier Glazier, MD  albuterol (PROVENTIL) (5 MG/ML) 0.5% nebulizer solution Take 2.5 mg by nebulization every 6 (six) hours as needed for wheezing or shortness of breath.    Yes [provider]  atorvastatin (LIPITOR) 20 MG tablet Take 20 mg by mouth daily at 6 PM.  06/25/17  Yes [provider]    budesonide-formoterol (SYMBICORT) 160-4.5 MCG/ACT inhaler Inhale 2 puffs into the lungs 2 (two) times daily. 07/26/17  Yes Javier Glazier, MD  cholecalciferol (VITAMIN D) 1000 UNITS tablet Take 1,000 Units by mouth daily.   Yes [provider]  furosemide (LASIX) 40 MG tablet Take 40 mg by mouth 3 (three) times daily.    Yes [provider]  losartan (COZAAR) 50 MG tablet Take 50 mg by mouth daily.   Yes [provider]  meclizine (ANTIVERT) 12.5 MG tablet Take 12.5 mg by mouth every 6 (six) hours as needed for dizziness (for vertigo).    Yes [provider]  metFORMIN (GLUCOPHAGE) 500 MG tablet Take 500 mg by mouth 2 (two) times daily with a meal.   Yes [provider]  metoprolol (TOPROL-XL) 200 MG 24 hr tablet Take 200 mg by mouth daily. 12/17/17  Yes [provider]  montelukast (SINGULAIR) 10 MG tablet Take 10 mg by mouth every morning.    Yes [provider]  potassium chloride SA (K-DUR,KLOR-CON) 20 MEQ tablet Take 20 mEq by mouth daily.   Yes [provider]  PRADAXA 150 MG CAPS capsule Take 150 mg by mouth 2 (two) times daily.  06/14/17  Yes [provider]  ranitidine (ZANTAC) 150 MG tablet Take 1 tablet (150 mg total) by mouth at bedtime. 10/22/17  Yes Javier Glazier, MD  budesonide (PULMICORT) 0.25 MG/2ML nebulizer solution Take 2 mLs (0.25 mg total) by nebulization 2 (two) times daily. DX: J45.909 Patient not taking: Reported on 12/31/2017 10/26/17   Javier Glazier, MD  formoterol (PERFOROMIST) 20 MCG/2ML nebulizer solution Take 2 mLs (20 mcg total) by nebulization 2 (two) times daily. Patient not taking: Reported on 12/31/2017 10/22/17   Javier Glazier, MD  ranitidine (ZANTAC) 150 MG tablet Take 1 tablet (150 mg total) by mouth at bedtime. Patient not taking: Reported on 12/31/2017 10/22/17   Javier Glazier, MD    Past Medical History: Past Medical History:  Diagnosis Date  . Anemia   .  Arthritis    "all over" (06/02/2013)  . Asthma   . Breast cancer ([place]) 2007   left  . CHF (congestive heart failure) ([place])   . Chronic bronchitis ([place])   . Chronic lower back pain   . Distal radial fracture 05/26/2013   right "fell" (06/02/2013)  . Exertional shortness of breath    "most of the time" (06/02/2013)  . GERD (gastroesophageal reflux disease)   . H/O hiatal hernia   . History of blood transfusion 2007   "related to breast cancer" (06/02/2013)  . Hyperlipidemia   . Hypertension   . Irregular heart beat   . Migraines    "years ago" (06/02/2013)  . On home oxygen therapy    "2L at night when I go to bed" (06/03/2103)  . Osteopenia   . Pneumonia    "walking pneumonia  once; hospitalized 2nd time I had pneumonia" (06/02/2013)  . Sleep apnea   . Type II diabetes mellitus ([place])   . Urge incontinence   . Vertigo, benign positional   . Vitamin D deficiency disease     Past Surgical History: Past Surgical History:  Procedure Laterality Date  . BREAST BIOPSY Left 2007  . BREAST LUMPECTOMY Left 2007  . CARDIAC CATHETERIZATION  07/2012  . CATARACT EXTRACTION W/ INTRAOCULAR LENS  IMPLANT, BILATERAL    . INTRAVASCULAR PRESSURE WIRE/FFR STUDY N/A 09/25/2017   Procedure: INTRAVASCULAR PRESSURE WIRE/FFR STUDY;  Surgeon: Nigel Mormon, MD;  Location: [place] [facility];  Service: Cardiovascular;  Laterality: N/A;  . JOINT REPLACEMENT    . KNEE ARTHROSCOPY Left   . LEFT HEART CATHETERIZATION WITH CORONARY ANGIOGRAM Right 08/02/2012   Procedure: LEFT HEART CATHETERIZATION WITH CORONARY ANGIOGRAM;  Surgeon: Birdie Riddle, MD;  Location: [place] CATH LAB;  Service: Cardiovascular;  Laterality: Right;  . MEDIAL PARTIAL KNEE REPLACEMENT Left   . RIGHT/LEFT HEART CATH AND CORONARY ANGIOGRAPHY N/A 09/25/2017   Procedure: RIGHT/LEFT HEART CATH AND CORONARY ANGIOGRAPHY;  Surgeon: Nigel Mormon, MD;  Location: [place] CV LAB;  Service: Cardiovascular;  Laterality: N/A;  . TOTAL KNEE  ARTHROPLASTY Bilateral   . TUBAL LIGATION      Family History: Family History  Problem Relation Age of Onset  . Stroke Father   . Emphysema Brother   . Cancer Sister        brain tumor  . Kidney disease Mother   . Asthma Brother     Social History: Social History   Socioeconomic History  . Marital status: Widowed    Spouse name: None  . Number of children: None  . Years of education: None  . Highest education level: None  Social Needs  . Financial resource strain: None  . Food insecurity - worry: None  . Food insecurity - inability: None  . Transportation needs - medical: None  . Transportation needs - non-medical: None  Occupational History  . None  Tobacco Use  . Smoking status: Former Smoker    Packs/day: 0.10    Years: 5.00    Pack years: 0.50    Types: Cigarettes    Last attempt to quit: 12/25/1960    Years since quitting: 57.0  . Smokeless tobacco: Never Used  Substance and Sexual Activity  . Alcohol use: Yes    Comment: 06/02/2013 "quit drinking ~ 35 yr ago; never had problem w/it"  . Drug use: No  . Sexual activity: No    Birth control/protection: Post-menopausal  Other Topics Concern  . None  Social History Narrative   [facility] (07/26/17):   Originally from [facility]. Has always lived in [place]. 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 No bird exposure. No mold exposure.     Allergies:  Allergies  Allergen Reactions  . Codeine Other (See Comments)    Unknown  . Sulfa Antibiotics Other (See Comments)    Unknown    Objective:    Vital Signs:   Temp:  [97.6 F (36.4 C)] 97.6 F (36.4 C) (01/07 1513) Pulse Rate:  [75-93] 87 (01/08 1145) Resp:  [13-25] 25 (01/08 1145) BP: (92-130)/(57-99) 100/61 (01/08 1100) SpO2:  [93 %-100 %] 93 % (01/08 1145) Weight:  [198 lb (89.8 kg)] 198 lb (89.8 kg) (01/07 1514)    Weight change: Filed Weights   12/31/17 1514  Weight: 198 lb (89.8  kg)     Intake/Output:   Intake/Output Summary (Last 24 hours) at 01/01/2018 1336 Last data filed at 01/01/2018 1030 Gross per 24 hour  Intake 3 ml  Output 550 ml  Net -547 ml      Physical Exam    General:  Elderly. Weak appearing  No resp difficulty HEENT: normal x poor dentition  Neck: supple. JVP 9-10. Carotids 2+ bilat; no bruits. No lymphadenopathy or thyromegaly appreciated. Cor: PMI nondisplaced. Irregular  rate & rhythm. No rubs, gallops or murmurs. Lungs: markedly decreased BS clear on 2 liters oxygen  Abdomen: soft, nontender, nondistended. No hepatosplenomegaly. No bruits or masses. Good bowel sounds. Extremities: no cyanosis, clubbing, rash, R and LLE trace edema Neuro: alert & orientedx3, cranial nerves grossly intact. moves all 4 extremities w/o difficulty. Affect pleasant   Telemetry    A Fib 70s low volts.  Personally reviewed  EKG    A fib 92 bpm low volts Personally reviewed   Labs   Basic Metabolic Panel: Recent Labs  Lab 12/31/17 1800 01/01/18 0357  NA 137 140  K 4.0 4.4  CL 100* 100*  CO2 27 30  GLUCOSE 99 98  BUN 20 21*  CREATININE 1.18* 1.22*  CALCIUM 8.9 9.2    Liver Function Tests: No results for input(s): AST, ALT, ALKPHOS, BILITOT, PROT, ALBUMIN in the last 168 hours. No results for input(s): LIPASE, AMYLASE in the last 168 hours. No results for input(s): AMMONIA in the last 168 hours.  CBC: Recent Labs  Lab 12/31/17 1800  WBC 5.1  NEUTROABS 3.2  HGB 9.2*  HCT 30.0*  MCV 84.0  PLT 278    Cardiac Enzymes: Recent Labs  Lab 12/31/17 1800  TROPONINI <0.03    BNP: BNP (last 3 results) Recent Labs    01/01/17 1735 12/31/17 1800  BNP 340.6* 375.3*    ProBNP (last 3 results) Recent Labs    07/26/17 1103  PROBNP 597.0*     CBG: Recent Labs  Lab 01/01/18 0742 01/01/18 1200  GLUCAP 113* 136*    Coagulation Studies: No results for input(s): LABPROT, INR in the last 72 hours.   Imaging   Dg Chest 2  View  Result Date: 12/31/2017 CLINICAL DATA:  Mid chest pain with shortness of breath and dizziness with exertion. History of CHF, asthma-chronic bronchitis. Remote history of smoking. EXAM: CHEST  2 VIEW COMPARISON:  Chest x-ray dated November 18, 2017 FINDINGS: The lungs are adequately inflated. There is no focal infiltrate. There is no pleural effusion. The cardiac silhouette is enlarged but stable. The pulmonary vascularity is not engorged. There is calcification in the wall of the aortic arch. There surgical clips in the left axillary region. There is multilevel degenerative disc disease of the thoracic spine with mild compression of the body of T9 which appears stable. IMPRESSION: Chronic bronchitic changes. No pneumonia nor pulmonary edema. Stable cardiomegaly. Thoracic aortic atherosclerosis. Electronically Signed   By: David  Martinique M.D.   On: 12/31/2017 16:48      Medications:     Current Medications: . aspirin EC  81 mg Oral Daily  . atorvastatin  20 mg Oral q1800  . dabigatran  150 mg Oral BID  . fluticasone furoate-vilanterol  1 puff Inhalation Daily  . furosemide  40 mg Intravenous Q6H  . insulin aspart  0-9 Units Subcutaneous TID WC  . metoprolol  200 mg Oral Daily  . montelukast  10 mg Oral Q breakfast  . potassium chloride SA  20  mEq Oral Daily  . sodium chloride flush  3 mL Intravenous Q12H     Infusions: . sodium chloride         Patient Profile   Lisa Lamartina is a 77 year old with history of hypertension, hyperlipidemia, DMII, left breast cancer with chemo/radiation,  chronic A fib, asthma, OSA uses CPAP,  on chronic home oxygen.   Assessment/Plan   1. A/C Hypoxic Respiratory Failure with severe chronic asthma On chronic oxygen.  Sats stable on 2 liters of oxygen  2. A/C Diastolic Heart Failure 3. Pulmonary Hypertension 4. OSA- uses CPAP 5. Chronic Afib    -Rate controlled     -On pradaxa 6. Morbid obesity  7. Deconditioning  See below Dr Gillermina Hu  recommendations. .   Medication concerns reviewed with patient and pharmacy team. Barriers identified: none  Length of Stay: 0  Darrick Grinder, NP  01/01/2018, 1:36 PM  Advanced Heart Failure Team Pager [phone] (M-F; 7a - 4p)   Patient seen and examined with Darrick Grinder, NP. We discussed all aspects of the encounter. I agree with the assessment and plan as stated above.   Patient's chart and above studies reviewed personally in detail. Echo image not available.   She has chronic Class IIIB dyspnea. She has had a very thorough w/u to date by Dr. Ashok Cordia in the recent past and has also undergone echo and R/L cath by Dr. Virgina Jock.   Her dyspnea is clearly multifactorial but I think the biggest driving factor is her severe underlying lung disease with a combination of obstructive/restrictive physiology and an FEV of only 0.82L & DLCO 45%.  Other contributing factors include diastolic HF (PCW on last cath 24), PAH, obesity and deconditioning.   In reviewing cath, PAH likely WHO group II (CHF) & III (chronic hypoxic lung disease) and not candidate for selective vasodilators.  With low volts on ECG, I do think it is reasonable to exclude possible amyloidosis with cMRI and SPEP/IFE   At this point we do not have much more to offer but would suggest:  1) Continue to support with O2 and CPAP to keep sats >= 90% at all times 2) Keep as dry as possible with diuretics 3) Continue weight loss efforts 4) Refer to Pulmonary Rehab -If unable to come to [facility] can enroll in [facility] home [facility] program 5) Consider cMRI and SPEP/IFE to exclude infiltrative process as contributing factor.   We will sign-off. Please call if we can help.   Glori Bickers, MD  3:41 PM   Please contact [facility] for night-coverage after hours (4p -7a ) and weekends on [email or web address]

## 2018-01-02 DIAGNOSIS — J45909 Unspecified asthma, uncomplicated: Secondary | ICD-10-CM

## 2018-01-02 LAB — BASIC METABOLIC PANEL
ANION GAP: 9 (ref 5–15)
BUN: 24 mg/dL — ABNORMAL HIGH (ref 6–20)
CO2: 27 mmol/L (ref 22–32)
Calcium: 8.5 mg/dL — ABNORMAL LOW (ref 8.9–10.3)
Chloride: 100 mmol/L — ABNORMAL LOW (ref 101–111)
Creatinine, Ser: 1.1 mg/dL — ABNORMAL HIGH (ref 0.44–1.00)
GFR calc Af Amer: 56 mL/min — ABNORMAL LOW (ref >60)
GFR calc non Af Amer: 48 mL/min — ABNORMAL LOW (ref >60)
GLUCOSE: 123 mg/dL — AB (ref 65–99)
POTASSIUM: 5.1 mmol/L (ref 3.5–5.1)
Sodium: 136 mmol/L (ref 135–145)

## 2018-01-02 LAB — GLUCOSE, CAPILLARY
GLUCOSE-CAPILLARY: 101 mg/dL — AB (ref 65–99)
GLUCOSE-CAPILLARY: 161 mg/dL — AB (ref 65–99)
Glucose-Capillary: 141 mg/dL — ABNORMAL HIGH (ref 65–99)
Glucose-Capillary: 121 mg/dL — ABNORMAL HIGH (ref 65–99)

## 2018-01-02 MED ORDER — FUROSEMIDE 10 MG/ML IJ SOLN: 40.0000 mg | Freq: Two times a day (BID) | INTRAMUSCULAR | Status: DC

## 2018-01-02 MED ORDER — SENNOSIDES-DOCUSATE SODIUM 8.6-50 MG PO TABS
1.0000 | ORAL_TABLET | Freq: Every day | ORAL | Status: DC
  Administered 2018-01-02 – 2018-01-03 (×2): 1 via ORAL
  Filled 2018-01-02 (×2): qty 1

## 2018-01-02 MED ORDER — POLYETHYLENE GLYCOL 3350 17 G PO PACK
17.0000 g | PACK | Freq: Every day | ORAL | Status: DC
  Administered 2018-01-02 – 2018-01-04 (×3): 17 g via ORAL
  Filled 2018-01-02 (×3): qty 1

## 2018-01-02 NOTE — Discharge Instructions (Signed)

## 2018-01-02 NOTE — Care Management Note (Signed)
Case Management Note  Patient Details  Name: Lisa Walters MRN: [id number] Date of Birth: March 16, 1941  Subjective/Objective:   CHF              Action/Plan: Patient lives at home alone; she has a very supportive grandson Marta Antu that takes her to her Md apts( she has [employer] also for transportation), shopping and he also pick up her medication; PCP: Benito Mccreedy, MD / also Dr Autumn Messing; has private insurance with Medicare / Medicaid with prescription drug coverage; pharmacy of choice is [facility]; she cooks food without salt and knows to weigh herself daily; patient could benefit from a Disease Management Program for CHF; [facility] choice offered, pt chose [facility]; [facility] with [facility] called for arrangements; DME - home oxygen through [facility], CPAP machine through [facility] and a cane. She is requesting a walker; rollater ( rolling walker with seat) ordered and to be delivered to the room. Very pleasant lady without any complaints. CM will continue to follow for progression of care.  Expected Discharge Date:     Possibly 01/03/2018             Expected Discharge Plan:  [place]  Discharge planning Services  CM Consult Choice offered to:  Patient  DME Arranged:  Walker rolling with seat DME Agency:  [place]:  RN, Disease Management [place] Agency:  [facility] (now [facility])  Status of Service:  In process, will continue to follow  Sherrilyn Rist [phone] 01/02/2018, 10:22 AM

## 2018-01-02 NOTE — Progress Notes (Signed)
Visited with this to pray with her via [place].  Patient's son at bedside.  Patient states she is feeling better and may be going home soon.  Chaplain can continue to follow as needed.    01/02/18 1149  Clinical Encounter Type  Visited With Patient;Family  Visit Type Initial;Spiritual support  Spiritual Encounters  Spiritual Needs Prayer

## 2018-01-02 NOTE — Progress Notes (Signed)
Subjective:   77 year old African-American female with hypertension, hyperlipidemia, type II diabetes mellitus, persistent Afib (CHA2DS2VASc score 4), on pradaxa, moderate obesity, severe persistent asthma on home oxygen, WHO group II/III pulmonary hypertension FC II-III. I saw the patient in the office earlier today.  She has had significant worsening of her dyspnea, now class III to class IV symptoms, as well as peripheral edema.  She continues to be on home oxygen.  She denies any chest pain, presyncope or syncope, palpitations.  Given her severity of symptoms, I recommended hospital admission on 12/31/2017  Breathing improved with diuresis. Edema has improved as well.   Objective:  Vital Signs in the last 24 hours: Temp:  [97.9 F (36.6 C)-98.2 F (36.8 C)] 98.2 F (36.8 C) (01/09 0603) Pulse Rate:  [76-93] 87 (01/09 0603) Resp:  [13-25] 18 (01/08 2045) BP: (91-108)/(57-76) 99/57 (01/09 0603) SpO2:  [89 %-97 %] 94 % (01/09 0603) FiO2 (%):  [90 %] 90 % (01/08 1755) Weight:  [85.6 kg (188 lb 12.8 oz)-86.4 kg (190 lb 9 oz)] 85.6 kg (188 lb 12.8 oz) (01/09 0539)  Intake/Output from previous day: 01/08 0701 - 01/09 0700 In: 123 [P.O.:120; I.V.:3] Out: 2150 [Urine:2150] Intake/Output from this shift: No intake/output data recorded.  Physical Exam: Nursing note and vitals reviewed. Constitutional: She is oriented to person, place, and time. She appears well-developed and well-nourished.  HENT:  Head: Atraumatic.  Eyes: Pupils are equal, round, and reactive to light.  Neck: No JVD appreciated.  Cardiovascular: Normal rate.  Murmur (II/VI holosystolic murmur RLSB) heard. Respiratory: Breath sounds normal. She is not in respiratory distress. She has no wheezes.  GI: Soft. Bowel sounds are normal. There is no tenderness.  Musculoskeletal: Normal range of motion. She exhibits edema (1+ b/l).  Neurological: She is alert and oriented to person, place, and time.  Skin: Skin is dry.       Lab Results: Recent Labs    12/31/17 1800  WBC 5.1  HGB 9.2*  PLT 278   Recent Labs    01/01/18 0357 01/02/18 0440  NA 140 136  K 4.4 5.1  CL 100* 100*  CO2 30 27  GLUCOSE 98 123*  BUN 21* 24*  CREATININE 1.22* 1.10*   Recent Labs    12/31/17 1800  TROPONINI <0.03    Imaging: CXR 12/31/17: CLINICAL DATA:  Mid chest pain with shortness of breath and dizziness with exertion. History of CHF, asthma-chronic bronchitis. Remote history of smoking.  EXAM: CHEST  2 VIEW  COMPARISON:  Chest x-ray dated November 18, 2017  FINDINGS: The lungs are adequately inflated. There is no focal infiltrate. There is no pleural effusion. The cardiac silhouette is enlarged but stable. The pulmonary vascularity is not engorged. There is calcification in the wall of the aortic arch. There surgical clips in the left axillary region. There is multilevel degenerative disc disease of the thoracic spine with mild compression of the body of T9 which appears stable.  IMPRESSION: Chronic bronchitic changes. No pneumonia nor pulmonary edema. Stable cardiomegaly.  Thoracic aortic atherosclerosis.  Cardiac Studies: EKG 12/31/2017: Rate controlled atrial fibrillation. Low voltage. Nonspecific ST-T changes.   Assessment: Acute on chronic cor pulmonale Pulmonary hypertension WHO Grp II/III FC III Peristent Afib CHA2DS2VASc score 4 Severe persistent asthma Hypertension Type 2 diabetes mellitus Hyperlipidemia History of breast cancer Moderate obesity  Recommendations: Decrease lasix to 40 mg IV bid. Possible switch to PO on 01/03/17 and discharge by 01/04/17. Continue daily weights and strict intake and output.  Holding off right heart cath, as she will not be a candidate for vasodilator therapy. Consider pulmonary consult.  Continue metoprolol succinate 200 mg once daily, pradaxa. Management of chronic medical comorbidities as per hospitalist service.  Appreciate their  help.     LOS: 1 day    Jermarion Poffenberger J Tykia Mellone 01/02/2018, 7:40 AM  [place], MD [facility] [place] Pager: [phone] Office: [phone] If no answer Cell [phone]

## 2018-01-02 NOTE — Progress Notes (Signed)
Patient requesting laxative. MD text paged.

## 2018-01-02 NOTE — Progress Notes (Signed)
PROGRESS NOTE  Lisa Walters YWV:[id number] DOB: 22-Aug-1941 DOA: 12/31/2017 PCP: Benito Mccreedy, MD  HPI/Recap of past 24 hours: 77 y.o.femalewith medical history significant ofdiastolic CHF, afib on pradaxa, asthma, crf on home oxygen, comes in with 3 days of worsening doe and le edema. No fevers. No cough. No chest pain. She went to see her cardiologist today who recommended admission for RHC in am and diuresis. Her lasix orally 40mg  was just increased to tid last week and has not helped. She is compliant with meds. Admitted for further management.  Today, pt denied any new complains, reports breathing is better overall. Denies any chest pain, abdominal pain.  Assessment/Plan: Principal Problem:   CHF exacerbation (HCC) Active Problems:   Asthma   OSA on CPAP   Atrial fibrillation (HCC)   Chronic respiratory failure with hypoxia (HCC)   Pulmonary hypertension ([place])  Acute on chronic cor pulmonale Improving Cardiology following, continue diuresis Continue IV lasix 40 mg BID, plan to switch to PO 1/10 Metoprolol Strict I & Os  Pulmonary HTN Grp II/III Stable Plans for heart cath, currently discontinued as pt would not be a candidate for vasodilator therapy however now on hold Consider pulm consult  Severe persistent Asthma/chronic res failure O2 as needed, nebs, inhalers, singular  Persistent Afib Rate controlled CHA2DS2VASc score 4, continue pradaxa  HTN On the lower side, monitor closley  DM Type 2 SSI, held home metformin  OSA Not on CPAP    Code Status: Full  Family Communication: None at bedside  Disposition Plan: Home once stable   Consultants:  Cardiology  Procedures:  None  Antimicrobials:  None  DVT prophylaxis:  Pradaxa   Objective: Vitals:   01/02/18 0603 01/02/18 0917 01/02/18 1155 01/02/18 1625  BP: (!) 99/57  97/66 (!) 84/50  Pulse: 87 88 90 89  Resp:  18    Temp: 98.2 F (36.8 C)     TempSrc: Oral     SpO2:  94% 92% 99% 91%  Weight:      Height:        Intake/Output Summary (Last 24 hours) at 01/02/2018 1732 Last data filed at 01/02/2018 1156 Gross per 24 hour  Intake 120 ml  Output 2550 ml  Net -2430 ml   Filed Weights   12/31/17 1514 01/01/18 1448 01/02/18 0539  Weight: 89.8 kg (198 lb) 86.4 kg (190 lb 9 oz) 85.6 kg (188 lb 12.8 oz)    Exam:   General:  AAO, NAD  Cardiovascular: S1, S2 present, holosystolic murmur heard  Respiratory: Chest clear bilaterally  Abdomen: Soft, non-tender, non- distended, BS present  Musculoskeletal: 1+ bilateral LE edema  Skin: Normal  Psychiatry: Normal affect   Data Reviewed: CBC: Recent Labs  Lab 12/31/17 1800  WBC 5.1  NEUTROABS 3.2  HGB 9.2*  HCT 30.0*  MCV 84.0  PLT 854   Basic Metabolic Panel: Recent Labs  Lab 12/31/17 1800 01/01/18 0357 01/02/18 0440  NA 137 140 136  K 4.0 4.4 5.1  CL 100* 100* 100*  CO2 27 30 27   GLUCOSE 99 98 123*  BUN 20 21* 24*  CREATININE 1.18* 1.22* 1.10*  CALCIUM 8.9 9.2 8.5*   GFR: Estimated Creatinine Clearance: 46.5 mL/min (A) (by C-G formula based on SCr of 1.1 mg/dL (H)). Liver Function Tests: No results for input(s): AST, ALT, ALKPHOS, BILITOT, PROT, ALBUMIN in the last 168 hours. No results for input(s): LIPASE, AMYLASE in the last 168 hours. No results for input(s): AMMONIA in the  last 168 hours. Coagulation Profile: No results for input(s): INR, PROTIME in the last 168 hours. Cardiac Enzymes: Recent Labs  Lab 12/31/17 1800  TROPONINI <0.03   BNP (last 3 results) Recent Labs    07/26/17 1103  PROBNP 597.0*   HbA1C: No results for input(s): HGBA1C in the last 72 hours. CBG: Recent Labs  Lab 01/01/18 1637 01/01/18 2117 01/02/18 0738 01/02/18 1130 01/02/18 1622  GLUCAP 118* 189* 101* 161* 141*   Lipid Profile: No results for input(s): CHOL, HDL, LDLCALC, TRIG, CHOLHDL, LDLDIRECT in the last 72 hours. Thyroid Function Tests: No results for input(s): TSH,  T4TOTAL, FREET4, T3FREE, THYROIDAB in the last 72 hours. Anemia Panel: No results for input(s): VITAMINB12, FOLATE, FERRITIN, TIBC, IRON, RETICCTPCT in the last 72 hours. Urine analysis:    Component Value Date/Time   COLORURINE YELLOW 05/31/2013 0925   APPEARANCEUR CLOUDY (A) 05/31/2013 0925   LABSPEC 1.021 05/31/2013 0925   PHURINE 5.5 05/31/2013 0925   GLUCOSEU NEGATIVE 05/31/2013 0925   HGBUR TRACE (A) 05/31/2013 0925   BILIRUBINUR NEGATIVE 05/31/2013 0925   KETONESUR NEGATIVE 05/31/2013 0925   PROTEINUR NEGATIVE 05/31/2013 0925   UROBILINOGEN 1.0 05/31/2013 0925   NITRITE NEGATIVE 05/31/2013 0925   LEUKOCYTESUR MODERATE (A) 05/31/2013 0925   Sepsis Labs: @LABRCNTIP (procalcitonin:4,lacticidven:4)  )No results found for this or any previous visit (from the past 240 hour(s)).    Studies: No results found.  Scheduled Meds: . aspirin EC  81 mg Oral Daily  . atorvastatin  20 mg Oral q1800  . dabigatran  150 mg Oral BID  . fluticasone furoate-vilanterol  1 puff Inhalation Daily  . furosemide  40 mg Intravenous Q12H  . insulin aspart  0-9 Units Subcutaneous TID WC  . metoprolol  200 mg Oral Daily  . montelukast  10 mg Oral Q breakfast  . potassium chloride SA  20 mEq Oral Daily  . sodium chloride flush  3 mL Intravenous Q12H    Continuous Infusions: . sodium chloride       LOS: 1 day     Alma Friendly, MD [facility]   If 7PM-7AM, please contact night-coverage [email or web address] Password [place] 01/02/2018, 5:32 PM

## 2018-01-03 LAB — GLUCOSE, CAPILLARY
GLUCOSE-CAPILLARY: 161 mg/dL — AB (ref 65–99)
Glucose-Capillary: 107 mg/dL — ABNORMAL HIGH (ref 65–99)
Glucose-Capillary: 158 mg/dL — ABNORMAL HIGH (ref 65–99)
Glucose-Capillary: 168 mg/dL — ABNORMAL HIGH (ref 65–99)

## 2018-01-03 LAB — BASIC METABOLIC PANEL
Anion gap: 9 (ref 5–15)
BUN: 23 mg/dL — AB (ref 6–20)
CALCIUM: 8.7 mg/dL — AB (ref 8.9–10.3)
CO2: 29 mmol/L (ref 22–32)
Chloride: 100 mmol/L — ABNORMAL LOW (ref 101–111)
Creatinine, Ser: 1.24 mg/dL — ABNORMAL HIGH (ref 0.44–1.00)
GFR calc Af Amer: 48 mL/min — ABNORMAL LOW (ref >60)
GFR, EST NON AFRICAN AMERICAN: 42 mL/min — AB (ref >60)
GLUCOSE: 110 mg/dL — AB (ref 65–99)
Potassium: 3.3 mmol/L — ABNORMAL LOW (ref 3.5–5.1)
Sodium: 138 mmol/L (ref 135–145)

## 2018-01-03 MED ORDER — FUROSEMIDE 40 MG PO TABS
40.0000 mg | ORAL_TABLET | Freq: Two times a day (BID) | ORAL | Status: DC
  Administered 2018-01-03 – 2018-01-04 (×3): 40 mg via ORAL
  Filled 2018-01-03 (×3): qty 1

## 2018-01-03 NOTE — Evaluation (Signed)
Physical Therapy Evaluation Patient Details Name: Lisa Walters MRN: [id number] DOB: [birth year] Today's Date: 01/03/2018   History of Present Illness  Pt is a 77 y.o. female admitted 12/31/17 after presenting to outpatient cardiologist with worsening dyspnea and peripheral edema; worked up for CHF exacerbation. PMH includes HTN, HLD, persistent a-fib, severe asthma (on home O2), pulmonary hypertension.     Clinical Impression  Patient evaluated by Physical Therapy with no further acute PT needs identified. Today, pt educated on use of rollator; mod indep with mobility using this. Also mod indep with stair training. All education has been completed and the patient has no further questions. Discussed importance of mobility, fall risk reduction, and activity pacing. PT is signing off. Thank you for this referral.    Follow Up Recommendations No PT follow up    Equipment Recommendations  None recommended by PT    Recommendations for Other Services       Precautions / Restrictions Precautions Precautions: Fall Restrictions Weight Bearing Restrictions: No      Mobility  Bed Mobility Overal bed mobility: Independent                Transfers Overall transfer level: Independent Equipment used: None                Ambulation/Gait Ambulation/Gait assistance: Modified independent (Device/Increase time) Ambulation Distance (Feet): 200 Feet Assistive device: 4-wheeled walker(rollator) Gait Pattern/deviations: Step-through pattern;Decreased stride length;Trunk flexed Gait velocity: Decreased Gait velocity interpretation: <1.8 ft/sec, indicative of risk for recurrent falls General Gait Details: Practiced ambulating with rollator, pt with good technique. 1x seated rest break after stair training secondary to DOE, educ on locking rollator before sit<>stand  Stairs Stairs: Yes Stairs assistance: Modified independent (Device/Increase time) Stair Management: One rail Left;Step  to pattern;Forwards Number of Stairs: 5 General stair comments: Ascend/descended 5 steps with single UE support on rail. DOE 2/4 requiring 1x seated rest break on Pensions consultant    Modified Rankin (Stroke Patients Only)       Balance Overall balance assessment: Modified Independent                                           Pertinent Vitals/Pain Pain Assessment: No/denies pain    Home Living Family/patient expects to be discharged to:: Private residence Living Arrangements: Alone Available Help at Discharge: Family;Available PRN/intermittently Type of Home: House Home Access: Stairs to enter Entrance Stairs-Rails: Psychiatric nurse of Steps: 8 Home Layout: One level Home Equipment: None      Prior Function Level of Independence: Independent         Comments: Reliant on grandson, SCAT, or social services for transportation. Grandson buys groceries/runs errands     Hand Dominance        Extremity/Trunk Assessment   Upper Extremity Assessment Upper Extremity Assessment: Overall WFL for tasks assessed    Lower Extremity Assessment Lower Extremity Assessment: Generalized weakness       Communication   Communication: No difficulties  Cognition Arousal/Alertness: Awake/alert Behavior During Therapy: WFL for tasks assessed/performed Overall Cognitive Status: Within Functional Limits for tasks assessed                                        General Comments General comments (skin integrity, edema,  etc.): SpO2 >91% on 2L O2 [place]    Exercises     Assessment/Plan    PT Assessment Patent does not need any further PT services  PT Problem List         PT Treatment Interventions      PT Goals (Current goals can be found in the Care Plan section)  Acute Rehab PT Goals PT Goal Formulation: All assessment and education complete, DC therapy    Frequency     Barriers to  discharge        Co-evaluation               AM-PAC PT "6 Clicks" Daily Activity  Outcome Measure Difficulty turning over in bed (including adjusting bedclothes, sheets and blankets)?: None Difficulty moving from lying on back to sitting on the side of the bed? : None Difficulty sitting down on and standing up from a chair with arms (e.g., wheelchair, bedside commode, etc,.)?: None Help needed moving to and from a bed to chair (including a wheelchair)?: None Help needed walking in hospital room?: None Help needed climbing 3-5 steps with a railing? : A Little 6 Click Score: 23    End of Session Equipment Utilized During Treatment: Gait belt;Oxygen Activity Tolerance: Patient tolerated treatment well Patient left: in chair;with call bell/phone within reach Nurse Communication: Mobility status PT Visit Diagnosis: Other abnormalities of gait and mobility (R26.89)    Time: 2951-8841 PT Time Calculation (min) (ACUTE ONLY): 23 min   Charges:   PT Evaluation $PT Eval Moderate Complexity: 1 Mod PT Treatments $Gait Training: 8-22 mins   PT G Codes:       Mabeline Caras, PT, DPT Acute Rehab Services  Pager: [place] 01/03/2018, 9:16 AM

## 2018-01-03 NOTE — Progress Notes (Signed)
Subjective:   77 year old African-American female with hypertension, hyperlipidemia, type II diabetes mellitus, persistent Afib (CHA2DS2VASc score 4), on pradaxa, moderate obesity, severe persistent asthma on home oxygen, WHO group II/III pulmonary hypertension FC II-III. I saw the patient in the office earlier today.  She has had significant worsening of her dyspnea, now class III to class IV symptoms, as well as peripheral edema.  She continues to be on home oxygen.  She denies any chest pain, presyncope or syncope, palpitations.  Given her severity of symptoms, I recommended hospital admission on 12/31/2017  Breathing and edema much improved with diuresis.    Objective:  Vital Signs in the last 24 hours: Temp:  [97.4 F (36.3 C)-98 F (36.7 C)] 97.4 F (36.3 C) (01/10 0433) Pulse Rate:  [79-90] 79 (01/10 0433) Resp:  [18] 18 (01/10 0433) BP: (84-109)/(49-66) 107/66 (01/10 0433) SpO2:  [91 %-99 %] 99 % (01/10 0433) Weight:  [84.9 kg (187 lb 1.6 oz)] 84.9 kg (187 lb 1.6 oz) (01/10 0433)  INet -4.1 L  Physical Exam: Nursing note and vitals reviewed. Constitutional: She is oriented to person, place, and time. She appears well-developed and well-nourished.  HENT:  Head: Atraumatic.  Eyes: Pupils are equal, round, and reactive to light.  Neck: No JVD appreciated.  Cardiovascular: Normal rate.  Murmur (II/VI holosystolic murmur RLSB) heard. Respiratory: Breath sounds normal. She is not in respiratory distress. She has no wheezes.  GI: Soft. Bowel sounds are normal. There is no tenderness.  Musculoskeletal: Normal range of motion. She exhibits no edema  Neurological: She is alert and oriented to person, place, and time.  Skin: Skin is dry.      Lab Results: Recent Labs    12/31/17 1800  WBC 5.1  HGB 9.2*  PLT 278   Recent Labs    01/01/18 0357 01/02/18 0440  NA 140 136  K 4.4 5.1  CL 100* 100*  CO2 30 27  GLUCOSE 98 123*  BUN 21* 24*  CREATININE 1.22* 1.10*    Recent Labs    12/31/17 1800  TROPONINI <0.03    Imaging: CXR 12/31/17: CLINICAL DATA:  Mid chest pain with shortness of breath and dizziness with exertion. History of CHF, asthma-chronic bronchitis. Remote history of smoking.  EXAM: CHEST  2 VIEW  COMPARISON:  Chest x-ray dated November 18, 2017  FINDINGS: The lungs are adequately inflated. There is no focal infiltrate. There is no pleural effusion. The cardiac silhouette is enlarged but stable. The pulmonary vascularity is not engorged. There is calcification in the wall of the aortic arch. There surgical clips in the left axillary region. There is multilevel degenerative disc disease of the thoracic spine with mild compression of the body of T9 which appears stable.  IMPRESSION: Chronic bronchitic changes. No pneumonia nor pulmonary edema. Stable cardiomegaly.  Thoracic aortic atherosclerosis.  Cardiac Studies: EKG 12/31/2017: Rate controlled atrial fibrillation. Low voltage. Nonspecific ST-T changes.   Assessment: Acute on chronic cor pulmonale Pulmonary hypertension WHO Grp II/III FC III Peristent Afib CHA2DS2VASc score 4 Severe persistent asthma Hypertension Type 2 diabetes mellitus Hyperlipidemia History of breast cancer Moderate obesity  Recommendations: Stop IV lasix. Start PO to 40 mg bid. Ambulate today. Possible discharge Friday 01/04/2017. Will arrange outpatient f/u.  Continue daily weights and strict intake and output.  Holding off right heart cath, as she will not be a candidate for vasodilator therapy. While amyloidosis is in the differential given low EKG, clinically more likely to be WHO Grp II/III pulmonary hypertension.  Continue metoprolol succinate 200 mg once daily, pradaxa, aspirin. Management of chronic medical comorbidities as per hospitalist service.  Appreciate their help.     LOS: 2 days    Ineze Serrao J Devaney Segers 01/03/2018, 7:28 AM  [place], MD [facility]  [facility]. [place] Pager: [phone] Office: [phone] If no answer Cell [phone]

## 2018-01-03 NOTE — Progress Notes (Signed)
PROGRESS NOTE  Lisa Walters TML:[id number] DOB: November 22, 1941 DOA: 12/31/2017 PCP: Benito Mccreedy, MD  HPI/Recap of past 24 hours: 77 y.o.femalewith medical history significant ofdiastolic CHF, afib on pradaxa, asthma, crf on home oxygen, comes in with 3 days of worsening doe and le edema. No fevers. No cough. No chest pain. She went to see her cardiologist today who recommended admission for RHC in am and diuresis. Her lasix orally 40mg  was just increased to tid last week and has not helped. She is compliant with meds. Admitted for further management.  Today, pt denied any new complains, reports breathing is better overall. Denies any chest pain, abdominal pain.  Assessment/Plan: Principal Problem:   CHF exacerbation (HCC) Active Problems:   Asthma   OSA on CPAP   Atrial fibrillation (HCC)   Chronic respiratory failure with hypoxia (HCC)   Pulmonary hypertension (HCC)  Acute on chronic cor pulmonale Improving Cardiology following, continue diuresis Switched to PO lasix 40 mg BID, Metoprolol Strict I & Os  Pulmonary HTN Grp II/III Stable Plans for heart cath, currently discontinued as pt would not be a candidate for vasodilator therapy however now on hold Consider pulm consult as an outpt  Severe persistent Asthma/chronic res failure O2 as needed, nebs, inhalers, singular  Persistent Afib Rate controlled CHA2DS2VASc score 4, continue pradaxa  HTN On the lower side, monitor closley  DM Type 2 SSI, held home metformin  OSA Not on CPAP    Code Status: Full  Family Communication: None at bedside  Disposition Plan: Home once stable   Consultants:  Cardiology  Procedures:  None  Antimicrobials:  None  DVT prophylaxis:  Pradaxa   Objective: Vitals:   01/03/18 0031 01/03/18 0433 01/03/18 0800 01/03/18 0937  BP: (!) 85/52 107/66 (!) 94/52   Pulse: 84 79 73 88  Resp: 18 18 18 16   Temp: (!) 97.5 F (36.4 C) (!) 97.4 F (36.3 C) 97.6 F  (36.4 C)   TempSrc: Oral Oral Oral   SpO2: 96% 99% 96% 96%  Weight:  84.9 kg (187 lb 1.6 oz)    Height:        Intake/Output Summary (Last 24 hours) at 01/03/2018 1557 Last data filed at 01/03/2018 0249 Gross per 24 hour  Intake 240 ml  Output 825 ml  Net -585 ml   Filed Weights   01/01/18 1448 01/02/18 0539 01/03/18 0433  Weight: 86.4 kg (190 lb 9 oz) 85.6 kg (188 lb 12.8 oz) 84.9 kg (187 lb 1.6 oz)    Exam:   General:  AAO, NAD  Cardiovascular: S1, S2 present, holosystolic murmur heard  Respiratory: Chest clear bilaterally  Abdomen: Soft, non-tender, non- distended, BS present  Musculoskeletal: 1+ bilateral LE edema  Skin: Normal  Psychiatry: Normal affect   Data Reviewed: CBC: Recent Labs  Lab 12/31/17 1800  WBC 5.1  NEUTROABS 3.2  HGB 9.2*  HCT 30.0*  MCV 84.0  PLT 681   Basic Metabolic Panel: Recent Labs  Lab 12/31/17 1800 01/01/18 0357 01/02/18 0440 01/03/18 0636  NA 137 140 136 138  K 4.0 4.4 5.1 3.3*  CL 100* 100* 100* 100*  CO2 27 30 27 29   GLUCOSE 99 98 123* 110*  BUN 20 21* 24* 23*  CREATININE 1.18* 1.22* 1.10* 1.24*  CALCIUM 8.9 9.2 8.5* 8.7*   GFR: Estimated Creatinine Clearance: 41.1 mL/min (A) (by C-G formula based on SCr of 1.24 mg/dL (H)). Liver Function Tests: No results for input(s): AST, ALT, ALKPHOS, BILITOT, PROT, ALBUMIN  in the last 168 hours. No results for input(s): LIPASE, AMYLASE in the last 168 hours. No results for input(s): AMMONIA in the last 168 hours. Coagulation Profile: No results for input(s): INR, PROTIME in the last 168 hours. Cardiac Enzymes: Recent Labs  Lab 12/31/17 1800  TROPONINI <0.03   BNP (last 3 results) Recent Labs    07/26/17 1103  PROBNP 597.0*   HbA1C: No results for input(s): HGBA1C in the last 72 hours. CBG: Recent Labs  Lab 01/02/18 1622 01/02/18 2117 01/03/18 0728 01/03/18 1141 01/03/18 1531  GLUCAP 141* 121* 107* 158* 168*   Lipid Profile: No results for input(s):  CHOL, HDL, LDLCALC, TRIG, CHOLHDL, LDLDIRECT in the last 72 hours. Thyroid Function Tests: No results for input(s): TSH, T4TOTAL, FREET4, T3FREE, THYROIDAB in the last 72 hours. Anemia Panel: No results for input(s): VITAMINB12, FOLATE, FERRITIN, TIBC, IRON, RETICCTPCT in the last 72 hours. Urine analysis:    Component Value Date/Time   COLORURINE YELLOW 05/31/2013 0925   APPEARANCEUR CLOUDY (A) 05/31/2013 0925   LABSPEC 1.021 05/31/2013 0925   PHURINE 5.5 05/31/2013 0925   GLUCOSEU NEGATIVE 05/31/2013 0925   HGBUR TRACE (A) 05/31/2013 0925   BILIRUBINUR NEGATIVE 05/31/2013 0925   KETONESUR NEGATIVE 05/31/2013 0925   PROTEINUR NEGATIVE 05/31/2013 0925   UROBILINOGEN 1.0 05/31/2013 0925   NITRITE NEGATIVE 05/31/2013 0925   LEUKOCYTESUR MODERATE (A) 05/31/2013 0925   Sepsis Labs: @LABRCNTIP (procalcitonin:4,lacticidven:4)  )No results found for this or any previous visit (from the past 240 hour(s)).    Studies: No results found.  Scheduled Meds: . aspirin EC  81 mg Oral Daily  . atorvastatin  20 mg Oral q1800  . dabigatran  150 mg Oral BID  . fluticasone furoate-vilanterol  1 puff Inhalation Daily  . furosemide  40 mg Oral BID  . insulin aspart  0-9 Units Subcutaneous TID WC  . metoprolol  200 mg Oral Daily  . montelukast  10 mg Oral Q breakfast  . polyethylene glycol  17 g Oral Daily  . potassium chloride SA  20 mEq Oral Daily  . senna-docusate  1 tablet Oral QHS  . sodium chloride flush  3 mL Intravenous Q12H    Continuous Infusions: . sodium chloride       LOS: 2 days     Alma Friendly, MD [facility]   If 7PM-7AM, please contact night-coverage [email or web address] Password [facility] 01/03/2018, 3:57 PM

## 2018-01-04 LAB — BASIC METABOLIC PANEL
Anion gap: 8 (ref 5–15)
BUN: 23 mg/dL — AB (ref 6–20)
CO2: 30 mmol/L (ref 22–32)
Calcium: 8.8 mg/dL — ABNORMAL LOW (ref 8.9–10.3)
Chloride: 99 mmol/L — ABNORMAL LOW (ref 101–111)
Creatinine, Ser: 1.17 mg/dL — ABNORMAL HIGH (ref 0.44–1.00)
GFR calc Af Amer: 52 mL/min — ABNORMAL LOW (ref >60)
GFR calc non Af Amer: 45 mL/min — ABNORMAL LOW (ref >60)
GLUCOSE: 121 mg/dL — AB (ref 65–99)
POTASSIUM: 3.9 mmol/L (ref 3.5–5.1)
Sodium: 137 mmol/L (ref 135–145)

## 2018-01-04 LAB — GLUCOSE, CAPILLARY
Glucose-Capillary: 129 mg/dL — ABNORMAL HIGH (ref 65–99)
Glucose-Capillary: 109 mg/dL — ABNORMAL HIGH (ref 65–99)

## 2018-01-04 MED ORDER — ASPIRIN 81 MG PO TBEC: 81.0000 mg | DELAYED_RELEASE_TABLET | Freq: Every day | ORAL | 0 refills | Status: AC

## 2018-01-04 MED ORDER — FUROSEMIDE 40 MG PO TABS: 40.0000 mg | ORAL_TABLET | Freq: Two times a day (BID) | ORAL | Status: AC

## 2018-01-04 NOTE — Progress Notes (Signed)
Subjective:   77 year old African-American female with hypertension, hyperlipidemia, type II diabetes mellitus, persistent Afib (CHA2DS2VASc score 4), on pradaxa, moderate obesity, severe persistent asthma on home oxygen, WHO group II/III pulmonary hypertension FC II-III. I saw the patient in the office earlier today.  She has had significant worsening of her dyspnea, now class III to class IV symptoms, as well as peripheral edema.  She continues to be on home oxygen.  She denies any chest pain, presyncope or syncope, palpitations.  Given her severity of symptoms, I recommended hospital admission on 12/31/2017  Breathing and edema much improved with diuresis.    Objective:  Vital Signs in the last 24 hours: Temp:  [97.2 F (36.2 C)-98.2 F (36.8 C)] 98.2 F (36.8 C) (01/11 0454) Pulse Rate:  [69-89] 77 (01/11 0454) Resp:  [16-20] 20 (01/11 0454) BP: (92-99)/(53-59) 92/59 (01/11 0454) SpO2:  [96 %-98 %] 96 % (01/11 0454) Weight:  [86 kg (189 lb 8 oz)] 86 kg (189 lb 8 oz) (01/11 0454)  INet -4.1 L  Physical Exam: Nursing note and vitals reviewed. Constitutional: She is oriented to person, place, and time. She appears well-developed and well-nourished.  HENT:  Head: Atraumatic.  Eyes: Pupils are equal, round, and reactive to light.  Neck: No JVD appreciated.  Cardiovascular: Normal rate.  Murmur (II/VI holosystolic murmur RLSB) heard. Respiratory: Breath sounds normal. She is not in respiratory distress. She has no wheezes.  GI: Soft. Bowel sounds are normal. There is no tenderness.  Musculoskeletal: Normal range of motion. She exhibits no edema  Neurological: She is alert and oriented to person, place, and time.  Skin: Skin is dry.      Lab Results: No results for input(s): WBC, HGB, PLT in the last 72 hours. Recent Labs    01/03/18 0636 01/04/18 0438  NA 138 137  K 3.3* 3.9  CL 100* 99*  CO2 29 30  GLUCOSE 110* 121*  BUN 23* 23*  CREATININE 1.24* 1.17*   No results  for input(s): TROPONINI in the last 72 hours.  Invalid input(s): CK, MB  Imaging: CXR 12/31/17: CLINICAL DATA:  Mid chest pain with shortness of breath and dizziness with exertion. History of CHF, asthma-chronic bronchitis. Remote history of smoking.  EXAM: CHEST  2 VIEW  COMPARISON:  Chest x-ray dated November 18, 2017  FINDINGS: The lungs are adequately inflated. There is no focal infiltrate. There is no pleural effusion. The cardiac silhouette is enlarged but stable. The pulmonary vascularity is not engorged. There is calcification in the wall of the aortic arch. There surgical clips in the left axillary region. There is multilevel degenerative disc disease of the thoracic spine with mild compression of the body of T9 which appears stable.  IMPRESSION: Chronic bronchitic changes. No pneumonia nor pulmonary edema. Stable cardiomegaly.  Thoracic aortic atherosclerosis.  Cardiac Studies: EKG 12/31/2017: Rate controlled atrial fibrillation. Low voltage. Nonspecific ST-T changes.   Assessment: Acute on chronic cor pulmonale Pulmonary hypertension WHO Grp II/III FC III Peristent Afib CHA2DS2VASc score 4 Severe persistent asthma Hypertension Type 2 diabetes mellitus Hyperlipidemia History of breast cancer Moderate obesity  Recommendations: Patient can be discharged today with lasix 40 mg PO bid. F/u w/me on 01/09/2018. Continue daily weights and strict intake and output.   Holding off right heart cath, as she will not be a candidate for vasodilator therapy. While amyloidosis is in the differential given low EKG, clinically more likely to be WHO Grp II/III pulmonary hypertension.  Continue metoprolol succinate 200 mg once  daily, pradaxa, aspirin. Management of chronic medical comorbidities as per hospitalist service.  Appreciate their help.     LOS: 3 days    Dusty Raczkowski J Estellar Cadena 01/04/2018, 8:35 AM  Nigel Mormon, MD [facility] Pager:  [phone] Office: [phone] If no answer Cell [phone]

## 2018-01-04 NOTE — Discharge Summary (Signed)
Discharge Summary  Lisa Walters HQI:[id number] DOB: [birth year]  PCP: Benito Mccreedy, MD  Admit date: 12/31/2017 Discharge date: 01/04/2018  Time spent: > 30 mins  Recommendations for Outpatient Follow-up:  1. PCP 2. Cardiology  Discharge Diagnoses:  Active Hospital Problems   Diagnosis Date Noted  . CHF exacerbation ([place]) 12/31/2017  . Pulmonary hypertension ([place]) 09/23/2017  . Atrial fibrillation ([place]) 07/26/2017  . Asthma 07/26/2017  . Chronic respiratory failure with hypoxia ([place]) 07/26/2017  . OSA on CPAP 07/26/2017    Resolved Hospital Problems  No resolved problems to display.    Discharge Condition: Stable  Diet recommendation: heart healthy  Vitals:   01/04/18 0900 01/04/18 1001  BP:  (!) 102/54  Pulse: 88 69  Resp: 18   Temp:    SpO2: 98%     History of present illness:  77 y.o.femalewith medical history significant ofdiastolic CHF, afib on pradaxa, asthma, crf on home oxygen, comes in with 3 days of worsening doe and le edema. No fevers. No cough. No chest pain. She went to see her cardiologist today who recommended admission for RHC in am and diuresis. Her lasix orally 40mg  was just increased to tid last week and has not helped. She is compliant with meds. Admitted for further management.  Today, pt denied any new complains, reports breathing is better overall. Denies any chest pain, abdominal pain. Patient is stable for discharge.  Hospital Course:  Principal Problem:   CHF exacerbation ([place]) Active Problems:   Asthma   OSA on CPAP   Atrial fibrillation ([facility])   Chronic respiratory failure with hypoxia (H[facility])   Pulmonary hypertension ([facility])  Acute on chronic cor pulmonale Improved Cardiology following, continue diuresis at home PO lasix 40 mg BID, Metoprolol Follow up with cardiology as an outpt  Pulmonary HTN Grp II/III Stable Plans for heart cath, currently discontinued as pt would not be a candidate for vasodilator  therapy Consider pulm consult as an outpt  Severe persistent Asthma/chronic res failure O2 as needed, nebs, inhalers, singular  Persistent Afib Rate controlled CHA2DS2VASc score 4, continue pradaxa  HTN Soft BP, held losartan PCP/Cardiology, monitor closely and restart permitting BP   DM Type 2 Continue home metformin  OSA Not on CPAP    Procedures:  None  Consultations:  Cardiology   Discharge Exam: BP (!) 102/54   Pulse 69   Temp 98.2 F (36.8 C) (Oral)   Resp 18   Ht 5\' 3"  (1.6 m)   Wt 86 kg (189 lb 8 oz)   SpO2 98%   BMI 33.57 kg/m   General: AAO, NAD Cardiovascular: S1, S2 present, holosystolic murmur heard Respiratory: Chest clear bilaterally  Discharge Instructions You were cared for by a hospitalist during your hospital stay. If you have any questions about your discharge medications or the care you received while you were in the hospital after you are discharged, you can call the unit and asked to speak with the hospitalist on call if the hospitalist that took care of you is not available. Once you are discharged, your primary care physician will handle any further medical issues. Please note that NO REFILLS for any discharge medications will be authorized once you are discharged, as it is imperative that you return to your primary care physician (or establish a relationship with a primary care physician if you do not have one) for your aftercare needs so that they can reassess your need for medications and monitor your lab values.  Discharge Instructions  Diet - low sodium heart healthy   Complete by:  As directed    Increase activity slowly   Complete by:  As directed      Allergies as of 01/04/2018      Reactions   Codeine Other (See Comments)   Unknown   Sulfa Antibiotics Other (See Comments)   Unknown      Medication List    STOP taking these medications   budesonide 0.25 MG/2ML nebulizer solution Commonly known as:  PULMICORT    formoterol 20 MCG/2ML nebulizer solution Commonly known as:  PERFOROMIST   losartan 50 MG tablet Commonly known as:  COZAAR     TAKE these medications   acetaminophen 500 MG tablet Commonly known as:  TYLENOL Take 500-1,000 mg by mouth every 6 (six) hours as needed (back).   albuterol (5 MG/ML) 0.5% nebulizer solution Commonly known as:  PROVENTIL Take 2.5 mg by nebulization every 6 (six) hours as needed for wheezing or shortness of breath.   albuterol 108 (90 Base) MCG/ACT inhaler Commonly known as:  PROVENTIL HFA;VENTOLIN HFA Inhale 2 puffs into the lungs every 4 (four) hours as needed for wheezing or shortness of breath.   aspirin 81 MG EC tablet Take 1 tablet (81 mg total) by mouth daily. Start taking on:  01/05/2018   atorvastatin 20 MG tablet Commonly known as:  LIPITOR Take 20 mg by mouth daily at 6 PM.   budesonide-formoterol 160-4.5 MCG/ACT inhaler Commonly known as:  SYMBICORT Inhale 2 puffs into the lungs 2 (two) times daily.   cholecalciferol 1000 units tablet Commonly known as:  VITAMIN D Take 1,000 Units by mouth daily.   furosemide 40 MG tablet Commonly known as:  LASIX Take 1 tablet (40 mg total) by mouth 2 (two) times daily. What changed:  when to take this   meclizine 12.5 MG tablet Commonly known as:  ANTIVERT Take 12.5 mg by mouth every 6 (six) hours as needed for dizziness (for vertigo).   metFORMIN 500 MG tablet Commonly known as:  GLUCOPHAGE Take 500 mg by mouth 2 (two) times daily with a meal.   metoprolol 200 MG 24 hr tablet Commonly known as:  TOPROL-XL Take 200 mg by mouth daily.   montelukast 10 MG tablet Commonly known as:  SINGULAIR Take 10 mg by mouth every morning.   potassium chloride SA 20 MEQ tablet Commonly known as:  K-DUR,KLOR-CON Take 20 mEq by mouth daily.   PRADAXA 150 MG Caps capsule Generic drug:  dabigatran Take 150 mg by mouth 2 (two) times daily.   ranitidine 150 MG tablet Commonly known as:  ZANTAC Take  1 tablet (150 mg total) by mouth at bedtime. What changed:  Another medication with the same name was removed. Continue taking this medication, and follow the directions you see here.      Allergies  Allergen Reactions  . Codeine Other (See Comments)    Unknown  . Sulfa Antibiotics Other (See Comments)    Unknown   Follow-up Information    [facility] Follow up.   Specialty:  [place] Why:  They will do your home health care at your home Contact information: [place] [address] [phone]        Nigel Mormon, MD Follow up on 01/09/2018.   Specialty:  Cardiology Why:  2:30 PM Contact information: [address] [place] [address] [phone]        Benito Mccreedy,  MD. Daphane Shepherd on 01/07/2018.   Specialty:  Internal Medicine Why:  11 am hospital follow up appt Contact information: [place] [address] [phone]            The results of significant diagnostics from this hospitalization (including imaging, microbiology, ancillary and laboratory) are listed below for reference.    Significant Diagnostic Studies: Dg Chest 2 View  Result Date: 12/31/2017 CLINICAL DATA:  Mid chest pain with shortness of breath and dizziness with exertion. History of CHF, asthma-chronic bronchitis. Remote history of smoking. EXAM: CHEST  2 VIEW COMPARISON:  Chest x-ray dated November 18, 2017 FINDINGS: The lungs are adequately inflated. There is no focal infiltrate. There is no pleural effusion. The cardiac silhouette is enlarged but stable. The pulmonary vascularity is not engorged. There is calcification in the wall of the aortic arch. There surgical clips in the left axillary region. There is multilevel degenerative disc disease of the thoracic spine with mild compression of the body of T9 which appears stable. IMPRESSION: Chronic bronchitic changes. No pneumonia nor pulmonary edema. Stable cardiomegaly.  Thoracic aortic atherosclerosis. Electronically Signed   By: David  Martinique M.D.   On: 12/31/2017 16:48    Microbiology: No results found for this or any previous visit (from the past 240 hour(s)).   Labs: Basic Metabolic Panel: Recent Labs  Lab 12/31/17 1800 01/01/18 0357 01/02/18 0440 01/03/18 0636 01/04/18 0438  NA 137 140 136 138 137  K 4.0 4.4 5.1 3.3* 3.9  CL 100* 100* 100* 100* 99*  CO2 27 30 27 29 30   GLUCOSE 99 98 123* 110* 121*  BUN 20 21* 24* 23* 23*  CREATININE 1.18* 1.22* 1.10* 1.24* 1.17*  CALCIUM 8.9 9.2 8.5* 8.7* 8.8*   Liver Function Tests: No results for input(s): AST, ALT, ALKPHOS, BILITOT, PROT, ALBUMIN in the last 168 hours. No results for input(s): LIPASE, AMYLASE in the last 168 hours. No results for input(s): AMMONIA in the last 168 hours. CBC: Recent Labs  Lab 12/31/17 1800  WBC 5.1  NEUTROABS 3.2  HGB 9.2*  HCT 30.0*  MCV 84.0  PLT 278   Cardiac Enzymes: Recent Labs  Lab 12/31/17 1800  TROPONINI <0.03   BNP: BNP (last 3 results) Recent Labs    12/31/17 1800  BNP 375.3*    ProBNP (last 3 results) Recent Labs    07/26/17 1103  PROBNP 597.0*    CBG: Recent Labs  Lab 01/03/18 1141 01/03/18 1531 01/03/18 2049 01/04/18 0728 01/04/18 1227  GLUCAP 158* 168* 161* 129* 109*       Signed:  Alma Friendly, MD [facility] 01/04/2018, 7:33 PM

## 2018-01-14 ENCOUNTER — Other Ambulatory Visit: Payer: Self-pay | Admitting: Internal Medicine

## 2018-01-14 DIAGNOSIS — Z1231 Encounter for screening mammogram for malignant neoplasm of breast: Secondary | ICD-10-CM

## 2018-01-18 ENCOUNTER — Ambulatory Visit: Payer: Medicare Other | Admitting: Adult Health

## 2018-01-22 ENCOUNTER — Ambulatory Visit: Payer: Medicare Other | Admitting: Emergency Medicine

## 2018-01-29 ENCOUNTER — Encounter (HOSPITAL_COMMUNITY): Payer: Self-pay

## 2018-01-29 ENCOUNTER — Emergency Department (HOSPITAL_COMMUNITY)
Admission: EM | Admit: 2018-01-29 | Discharge: 2018-01-30 | Disposition: A | Discharge status: Home / Self Care | Payer: Medicare Other | Attending: Emergency Medicine | Admitting: Emergency Medicine

## 2018-01-29 ENCOUNTER — Emergency Department (HOSPITAL_COMMUNITY): Payer: Medicare Other

## 2018-01-29 DIAGNOSIS — M79661 Pain in right lower leg: Secondary | ICD-10-CM | POA: Diagnosis not present

## 2018-01-29 DIAGNOSIS — Z7984 Long term (current) use of oral hypoglycemic drugs: Secondary | ICD-10-CM | POA: Diagnosis not present

## 2018-01-29 DIAGNOSIS — E119 Type 2 diabetes mellitus without complications: Secondary | ICD-10-CM | POA: Diagnosis not present

## 2018-01-29 DIAGNOSIS — R600 Localized edema: Secondary | ICD-10-CM | POA: Diagnosis present

## 2018-01-29 DIAGNOSIS — I509 Heart failure, unspecified: Secondary | ICD-10-CM | POA: Insufficient documentation

## 2018-01-29 DIAGNOSIS — J45909 Unspecified asthma, uncomplicated: Secondary | ICD-10-CM | POA: Insufficient documentation

## 2018-01-29 DIAGNOSIS — N189 Chronic kidney disease, unspecified: Secondary | ICD-10-CM | POA: Diagnosis not present

## 2018-01-29 DIAGNOSIS — Z87891 Personal history of nicotine dependence: Secondary | ICD-10-CM | POA: Insufficient documentation

## 2018-01-29 DIAGNOSIS — N289 Disorder of kidney and ureter, unspecified: Secondary | ICD-10-CM

## 2018-01-29 DIAGNOSIS — M79604 Pain in right leg: Secondary | ICD-10-CM

## 2018-01-29 DIAGNOSIS — R0602 Shortness of breath: Secondary | ICD-10-CM | POA: Insufficient documentation

## 2018-01-29 DIAGNOSIS — Z79899 Other long term (current) drug therapy: Secondary | ICD-10-CM | POA: Diagnosis not present

## 2018-01-29 DIAGNOSIS — I130 Hypertensive heart and chronic kidney disease with heart failure and stage 1 through stage 4 chronic kidney disease, or unspecified chronic kidney disease: Secondary | ICD-10-CM | POA: Diagnosis not present

## 2018-01-29 DIAGNOSIS — Z7901 Long term (current) use of anticoagulants: Secondary | ICD-10-CM | POA: Insufficient documentation

## 2018-01-29 DIAGNOSIS — Z7982 Long term (current) use of aspirin: Secondary | ICD-10-CM | POA: Diagnosis not present

## 2018-01-29 LAB — BASIC METABOLIC PANEL
ANION GAP: 12 (ref 5–15)
BUN: 45 mg/dL — ABNORMAL HIGH (ref 6–20)
CALCIUM: 9.2 mg/dL (ref 8.9–10.3)
CHLORIDE: 95 mmol/L — AB (ref 101–111)
CO2: 29 mmol/L (ref 22–32)
Creatinine, Ser: 1.61 mg/dL — ABNORMAL HIGH (ref 0.44–1.00)
GFR calc Af Amer: 35 mL/min — ABNORMAL LOW (ref >60)
GFR calc non Af Amer: 30 mL/min — ABNORMAL LOW (ref >60)
GLUCOSE: 183 mg/dL — AB (ref 65–99)
POTASSIUM: 4.1 mmol/L (ref 3.5–5.1)
Sodium: 136 mmol/L (ref 135–145)

## 2018-01-29 LAB — CBC
HEMATOCRIT: 29 % — AB (ref 36.0–46.0)
HEMOGLOBIN: 8.7 g/dL — AB (ref 12.0–15.0)
MCH: 25.4 pg — AB (ref 26.0–34.0)
MCHC: 30 g/dL (ref 30.0–36.0)
MCV: 84.5 fL (ref 78.0–100.0)
Platelets: 303 10*3/uL (ref 150–400)
RBC: 3.43 MIL/uL — ABNORMAL LOW (ref 3.87–5.11)
RDW: 17.8 % — ABNORMAL HIGH (ref 11.5–15.5)
WBC: 7 10*3/uL (ref 4.0–10.5)

## 2018-01-29 LAB — I-STAT TROPONIN, ED: TROPONIN I, POC: 0.01 ng/mL (ref 0.00–0.08)

## 2018-01-29 MED ORDER — TRAMADOL HCL 50 MG PO TABS: 50.0000 mg | ORAL_TABLET | Freq: Four times a day (QID) | ORAL | 0 refills | Status: AC | PRN

## 2018-01-29 MED ORDER — TRAMADOL HCL 50 MG PO TABS
50.0000 mg | ORAL_TABLET | Freq: Once | ORAL | Status: AC
  Administered 2018-01-29: 50 mg via ORAL
  Filled 2018-01-29: qty 1

## 2018-01-29 NOTE — ED Triage Notes (Addendum)
Pt from home with ems c.o increased swelling to right leg for the past day and states she is unable to walk, pt has hx of CHF taking lasix daily. Pt on 2L [place] at baseline and states "I stay SOB". Pt a.o,rates pain 10/10, VSS, nad

## 2018-01-29 NOTE — Discharge Instructions (Signed)
Renal function is worse on today's labs.  Close follow-up with your primary care doctor will be important to make sure it does not get worse.  Return to [facility] to the vascular lab to have your right lower extremity ultrasound study to rule out a blood clot in the leg.  If it is positive will refer you back to the emergency department if it is negative elect to go home.  Return for development of any chest pain or worse shortness of breath.

## 2018-01-29 NOTE — ED Notes (Signed)
Patient transported to X-ray 

## 2018-01-29 NOTE — ED Provider Notes (Signed)
[facility] EMERGENCY DEPARTMENT Provider Note   CSN: [id number] Arrival date & time: 01/29/18  2131     History   Chief Complaint Chief Complaint  Patient presents with  . Congestive Heart Failure  . Leg Swelling    HPI Lisa Walters is a 77 y.o. female.  Patient with known history of CHF.  Patient normally on 2 L of oxygen all the time.  She has baseline shortness of breath says no worse than usual.  No real concerns for chest pain.  Her concern is her bilateral leg swelling.  The right leg is swollen more than the left and the right leg has pain which is very unusual.  The pain is in the forefoot up to the knee.  No history of fall or injury.  Patient has a history of atrial fibrillation.  She does take blood thinner Pradaxa.      Past Medical History:  Diagnosis Date  . Anemia   . Arthritis    "all over" (06/02/2013)  . Asthma   . Breast cancer ([place]) 2007   left  . CHF (congestive heart failure) ([employer])   . Chronic bronchitis ([place])   . Chronic lower back pain   . Distal radial fracture 05/26/2013   right "fell" (06/02/2013)  . Exertional shortness of breath    "most of the time" (06/02/2013)  . GERD (gastroesophageal reflux disease)   . H/O hiatal hernia   . History of blood transfusion 2007   "related to breast cancer" (06/02/2013)  . Hyperlipidemia   . Hypertension   . Irregular heart beat   . Migraines    "years ago" (06/02/2013)  . On home oxygen therapy    "2L at night when I go to bed" (06/03/2103)  . Osteopenia   . Pneumonia    "walking pneumonia once; hospitalized 2nd time I had pneumonia" (06/02/2013)  . Sleep apnea   . Type II diabetes mellitus ([place])   . Urge incontinence   . Vertigo, benign positional   . Vitamin D deficiency disease     Patient Active Problem List   Diagnosis Date Noted  . CHF exacerbation ([place]) 12/31/2017  . Pulmonary hypertension, moderate to severe ([place])   . Pulmonary hypertension ([place]) 09/23/2017  . Asthma  07/26/2017  . Congestive heart failure (CHF) ([place]) 07/26/2017  . OSA on CPAP 07/26/2017  . Atrial fibrillation ([place]) 07/26/2017  . Chronic respiratory failure with hypoxia ([place]) 07/26/2017  . Hyperlipidemia 07/26/2017  . GERD (gastroesophageal reflux disease) 07/26/2017  . Diabetes mellitus ([place]) 07/26/2017  . Osteopenia   . Vitamin D deficiency disease   . Breast cancer ([place])   . Irregular heart beat     Past Surgical History:  Procedure Laterality Date  . BREAST BIOPSY Left 2007  . BREAST LUMPECTOMY Left 2007  . CARDIAC CATHETERIZATION  07/2012  . CATARACT EXTRACTION W/ INTRAOCULAR LENS  IMPLANT, BILATERAL    . INTRAVASCULAR PRESSURE WIRE/FFR STUDY N/A 09/25/2017   Procedure: INTRAVASCULAR PRESSURE WIRE/FFR STUDY;  Surgeon: Nigel Mormon, MD;  Location: [facility];  Service: Cardiovascular;  Laterality: N/A;  . JOINT REPLACEMENT    . KNEE ARTHROSCOPY Left   . LEFT HEART CATHETERIZATION WITH CORONARY ANGIOGRAM Right 08/02/2012   Procedure: LEFT HEART CATHETERIZATION WITH CORONARY ANGIOGRAM;  Surgeon: Birdie Riddle, MD;  Location: [facility];  Service: Cardiovascular;  Laterality: Right;  . MEDIAL PARTIAL KNEE REPLACEMENT Left   . RIGHT/LEFT HEART CATH AND CORONARY ANGIOGRAPHY N/A  09/25/2017   Procedure: RIGHT/LEFT HEART CATH AND CORONARY ANGIOGRAPHY;  Surgeon: Nigel Mormon, MD;  Location: [facility];  Service: Cardiovascular;  Laterality: N/A;  . TOTAL KNEE ARTHROPLASTY Bilateral   . TUBAL LIGATION      OB History    Gravida Para Term Preterm AB Living   4 4 4     4    SAB TAB Ectopic Multiple Live Births                   Home Medications    Prior to Admission medications   Medication Sig Start Date End Date Taking? Authorizing Provider  acetaminophen (TYLENOL) 500 MG tablet Take 500-1,000 mg by mouth every 6 (six) hours as needed (back).   Yes [provider]  albuterol (PROVENTIL HFA;VENTOLIN HFA) 108 (90 Base) MCG/ACT inhaler  Inhale 2 puffs into the lungs every 4 (four) hours as needed for wheezing or shortness of breath. 09/19/17  Yes Javier Glazier, MD  albuterol (PROVENTIL) (5 MG/ML) 0.5% nebulizer solution Take 2.5 mg by nebulization every 6 (six) hours as needed for wheezing or shortness of breath.    Yes [provider]  aspirin EC 81 MG EC tablet Take 1 tablet (81 mg total) by mouth daily. 01/05/18  Yes Alma Friendly, MD  atorvastatin (LIPITOR) 20 MG tablet Take 20 mg by mouth daily at 6 PM.  06/25/17  Yes [provider]  budesonide-formoterol (SYMBICORT) 160-4.5 MCG/ACT inhaler Inhale 2 puffs into the lungs 2 (two) times daily. 07/26/17  Yes Javier Glazier, MD  cholecalciferol (VITAMIN D) 1000 UNITS tablet Take 1,000 Units by mouth daily.   Yes [provider]  furosemide (LASIX) 40 MG tablet Take 1 tablet (40 mg total) by mouth 2 (two) times daily. Patient taking differently: Take 40 mg by mouth 4 (four) times daily.  01/04/18  Yes Alma Friendly, MD  meclizine (ANTIVERT) 12.5 MG tablet Take 12.5 mg by mouth every 6 (six) hours as needed for dizziness (for vertigo).    Yes [provider]  metFORMIN (GLUCOPHAGE) 500 MG tablet Take 500 mg by mouth 2 (two) times daily with a meal.   Yes [provider]  montelukast (SINGULAIR) 10 MG tablet Take 10 mg by mouth every morning.    Yes [provider]  potassium chloride SA (K-DUR,KLOR-CON) 20 MEQ tablet Take 20 mEq by mouth 2 (two) times daily.    Yes [provider]  PRADAXA 150 MG CAPS capsule Take 150 mg by mouth 2 (two) times daily.  06/14/17  Yes [provider]  ranitidine (ZANTAC) 150 MG tablet Take 1 tablet (150 mg total) by mouth at bedtime. 10/22/17  Yes Javier Glazier, MD  metoprolol (TOPROL-XL) 200 MG 24 hr tablet Take 200 mg by mouth daily. 12/17/17   [provider]  traMADol (ULTRAM) 50 MG tablet Take 1 tablet (50 mg total) by mouth every 6 (six) hours as  needed. 01/29/18   Fredia Sorrow, MD    Family History Family History  Problem Relation Age of Onset  . Stroke Father   . Emphysema Brother   . Cancer Sister        brain tumor  . Kidney disease Mother   . Asthma Brother     Social History Social History   Tobacco Use  . Smoking status: Former Smoker    Packs/day: 0.10    Years: 5.00    Pack years: 0.50    Types:  Cigarettes    Last attempt to quit: 12/25/1960    Years since quitting: 57.1  . Smokeless tobacco: Never Used  Substance Use Topics  . Alcohol use: Yes    Comment: 06/02/2013 "quit drinking ~ 35 yr ago; never had problem w/it"  . Drug use: No     Allergies   Codeine and Sulfa antibiotics   Review of Systems Review of Systems  Constitutional: Negative for fever.  HENT: Negative for congestion.   Eyes: Negative for visual disturbance.  Respiratory: Positive for shortness of breath.   Cardiovascular: Positive for leg swelling. Negative for chest pain and palpitations.  Gastrointestinal: Negative for abdominal pain.  Genitourinary: Negative for dysuria.  Musculoskeletal: Negative for back pain.  Skin: Negative for rash.  Neurological: Negative for syncope and headaches.  Hematological: Bruises/bleeds easily.  Psychiatric/Behavioral: Negative for confusion.     Physical Exam Updated Vital Signs BP (!) 106/58   Pulse 89   Temp 98.3 F (36.8 C) (Oral)   Resp 13   Ht 1.6 m (5\' 3" )   Wt 87.5 kg (193 lb)   SpO2 100%   BMI 34.19 kg/m   Physical Exam  Constitutional: She is oriented to person, place, and time. She appears well-developed and well-nourished. No distress.  HENT:  Head: Normocephalic and atraumatic.  Mouth/Throat: Oropharynx is clear and moist.  Eyes: EOM are normal. Pupils are equal, round, and reactive to light.  Neck: Normal range of motion. Neck supple.  Cardiovascular: Normal rate.  Irregular  Pulmonary/Chest: Effort normal and breath sounds normal. No respiratory distress. She  has no wheezes. She has no rales.  Abdominal: Soft. Bowel sounds are normal.  Musculoskeletal: She exhibits edema. She exhibits no tenderness or deformity.  Bilateral lower extremity swelling.  Right greater than left.  Both extremities warm good cap refill.  No skin lesions.  Patient describes subjective discomfort in the right leg from the forefoot up to the knee.  But there is no tenderness to palpation.  No palpable cord.  No calf tenderness.  Neurological: She is alert and oriented to person, place, and time. No cranial nerve deficit or sensory deficit. She exhibits normal muscle tone. Coordination normal.  Skin: Skin is warm.  Nursing note and vitals reviewed.    ED Treatments / Results  Labs (all labs ordered are listed, but only abnormal results are displayed) Labs Reviewed  BASIC METABOLIC PANEL - Abnormal; Notable for the following components:      Result Value   Chloride 95 (*)    Glucose, Bld 183 (*)    BUN 45 (*)    Creatinine, Ser 1.61 (*)    GFR calc non Af Amer 30 (*)    GFR calc Af Amer 35 (*)    All other components within normal limits  CBC - Abnormal; Notable for the following components:   RBC 3.43 (*)    Hemoglobin 8.7 (*)    HCT 29.0 (*)    MCH 25.4 (*)    RDW 17.8 (*)    All other components within normal limits  I-STAT TROPONIN, ED    EKG  EKG Interpretation  Date/Time:  Tuesday January 29 2018 21:46:31 EST Ventricular Rate:  88 PR Interval:    QRS Duration: 78 QT Interval:  342 QTC Calculation: 414 R Axis:   79 Text Interpretation:  Atrial fibrillation Low voltage, precordial leads Borderline repolarization abnormality No significant change since last tracing Confirmed by Fredia Sorrow [phone]) on 01/29/2018 11:25:22 PM  Radiology Dg Chest 2 View  Result Date: 01/29/2018 CLINICAL DATA:  Shortness of breath EXAM: CHEST  2 VIEW COMPARISON:  12/31/2017 FINDINGS: Surgical clips in the left axilla. Cardiomegaly with aortic atherosclerosis.  Mild central vascular congestion. Hazy bibasilar opacity. No pneumothorax. IMPRESSION: 1. Streaky bibasilar opacity may reflect atelectasis or minimal infiltrates 2. Cardiomegaly with mild vascular congestion. Electronically Signed   By: Donavan Foil M.D.   On: 01/29/2018 22:17    Procedures Procedures (including critical care time)  Medications Ordered in ED Medications  traMADol (ULTRAM) tablet 50 mg (50 mg Oral Given 01/29/18 2258)     Initial Impression / Assessment and Plan / ED Course  I have reviewed the triage vital signs and the nursing notes.  Pertinent labs & imaging results that were available during my care of the patient were reviewed by me and considered in my medical decision making (see chart for details).     Patient not really here for shortness of breath or chest pain.  Patient's main concern is been increased swelling to her right lower extremity.  She has swelling of both legs.  Patient's oxygen saturations are in the mid 90s she has some baseline shortness of breath and is not any worse than usual.  Pain associated with the right lower extremity without any injury.  This is somewhat of a new finding.  On exam no significant abnormalities to the both legs are swollen right may be a little bit more than left.  Both are warm good cap refill.  But the pain was improved with tramadol.  And will continue tramadol at home.  Patient is on the blood thinner paradox secondary to her atrial fib still feels necessary to arrange an outpatient vascular Doppler study to rule out DVT.  Patient will return tomorrow to have that done.  Patient's labs are significant for worsening, patient's creatinine is 1.6 and BUN is elevated.  Do not feel this is significant enough to require admission.  Patient really does not want admission  Patient troponin was not elevated.  Chest x-ray without evidence of any significant congestive heart failure.  Patient's EKG shows rate controlled atrial  fib.  Patient stable for discharge home and follow-up Doppler study in the morning and follow-up with her primary care doctor to have her renal function rechecked.  Final Clinical Impressions(s) / ED Diagnoses   Final diagnoses:  Bilateral lower extremity edema  Right leg pain  Renal insufficiency    ED Discharge Orders        Ordered    traMADol (ULTRAM) 50 MG tablet  Every 6 hours PRN     01/29/18 2354    [facility]ENOUS     01/29/18 2356       Fredia Sorrow, MD 01/30/18 0004

## 2018-01-29 NOTE — ED Notes (Signed)
ED Provider at bedside. 

## 2018-01-30 ENCOUNTER — Ambulatory Visit (HOSPITAL_COMMUNITY): Payer: Medicare Other

## 2018-02-01 ENCOUNTER — Ambulatory Visit: Payer: Medicare Other

## 2018-02-19 ENCOUNTER — Ambulatory Visit: Payer: Medicare Other | Admitting: Emergency Medicine

## 2018-02-23 ENCOUNTER — Inpatient Hospital Stay (HOSPITAL_COMMUNITY)
Admission: EM | Admit: 2018-02-23 | Discharge: 2018-03-25 | DRG: 640 | Disposition: E | Payer: Medicare Other | Attending: Pulmonary Disease | Admitting: Pulmonary Disease

## 2018-02-23 ENCOUNTER — Encounter (HOSPITAL_COMMUNITY): Payer: Self-pay

## 2018-02-23 ENCOUNTER — Inpatient Hospital Stay (HOSPITAL_COMMUNITY): Admission: EM | Disposition: E | Payer: Self-pay | Source: Home / Self Care | Attending: Pulmonary Disease

## 2018-02-23 ENCOUNTER — Emergency Department (HOSPITAL_COMMUNITY): Payer: Medicare Other

## 2018-02-23 ENCOUNTER — Other Ambulatory Visit: Payer: Self-pay

## 2018-02-23 DIAGNOSIS — E872 Acidosis, unspecified: Secondary | ICD-10-CM | POA: Diagnosis present

## 2018-02-23 DIAGNOSIS — Z9842 Cataract extraction status, left eye: Secondary | ICD-10-CM

## 2018-02-23 DIAGNOSIS — I4891 Unspecified atrial fibrillation: Secondary | ICD-10-CM | POA: Diagnosis present

## 2018-02-23 DIAGNOSIS — Z853 Personal history of malignant neoplasm of breast: Secondary | ICD-10-CM | POA: Diagnosis not present

## 2018-02-23 DIAGNOSIS — M545 Low back pain: Secondary | ICD-10-CM | POA: Diagnosis present

## 2018-02-23 DIAGNOSIS — E669 Obesity, unspecified: Secondary | ICD-10-CM | POA: Diagnosis present

## 2018-02-23 DIAGNOSIS — Z515 Encounter for palliative care: Secondary | ICD-10-CM | POA: Diagnosis not present

## 2018-02-23 DIAGNOSIS — G8929 Other chronic pain: Secondary | ICD-10-CM | POA: Diagnosis present

## 2018-02-23 DIAGNOSIS — R079 Chest pain, unspecified: Secondary | ICD-10-CM | POA: Diagnosis present

## 2018-02-23 DIAGNOSIS — Z9841 Cataract extraction status, right eye: Secondary | ICD-10-CM

## 2018-02-23 DIAGNOSIS — Z96653 Presence of artificial knee joint, bilateral: Secondary | ICD-10-CM | POA: Diagnosis present

## 2018-02-23 DIAGNOSIS — D638 Anemia in other chronic diseases classified elsewhere: Secondary | ICD-10-CM | POA: Diagnosis present

## 2018-02-23 DIAGNOSIS — J9621 Acute and chronic respiratory failure with hypoxia: Secondary | ICD-10-CM | POA: Diagnosis present

## 2018-02-23 DIAGNOSIS — R571 Hypovolemic shock: Secondary | ICD-10-CM | POA: Diagnosis present

## 2018-02-23 DIAGNOSIS — E559 Vitamin D deficiency, unspecified: Secondary | ICD-10-CM | POA: Diagnosis present

## 2018-02-23 DIAGNOSIS — R570 Cardiogenic shock: Secondary | ICD-10-CM

## 2018-02-23 DIAGNOSIS — Z9981 Dependence on supplemental oxygen: Secondary | ICD-10-CM

## 2018-02-23 DIAGNOSIS — I213 ST elevation (STEMI) myocardial infarction of unspecified site: Secondary | ICD-10-CM

## 2018-02-23 DIAGNOSIS — N183 Chronic kidney disease, stage 3 (moderate): Secondary | ICD-10-CM | POA: Diagnosis not present

## 2018-02-23 DIAGNOSIS — Z9989 Dependence on other enabling machines and devices: Secondary | ICD-10-CM

## 2018-02-23 DIAGNOSIS — I110 Hypertensive heart disease with heart failure: Secondary | ICD-10-CM | POA: Diagnosis present

## 2018-02-23 DIAGNOSIS — Z882 Allergy status to sulfonamides status: Secondary | ICD-10-CM

## 2018-02-23 DIAGNOSIS — E1122 Type 2 diabetes mellitus with diabetic chronic kidney disease: Secondary | ICD-10-CM | POA: Diagnosis not present

## 2018-02-23 DIAGNOSIS — J9611 Chronic respiratory failure with hypoxia: Secondary | ICD-10-CM | POA: Diagnosis present

## 2018-02-23 DIAGNOSIS — M1990 Unspecified osteoarthritis, unspecified site: Secondary | ICD-10-CM | POA: Diagnosis present

## 2018-02-23 DIAGNOSIS — M8580 Other specified disorders of bone density and structure, unspecified site: Secondary | ICD-10-CM | POA: Diagnosis present

## 2018-02-23 DIAGNOSIS — I481 Persistent atrial fibrillation: Secondary | ICD-10-CM | POA: Diagnosis present

## 2018-02-23 DIAGNOSIS — J45909 Unspecified asthma, uncomplicated: Secondary | ICD-10-CM | POA: Diagnosis present

## 2018-02-23 DIAGNOSIS — Z825 Family history of asthma and other chronic lower respiratory diseases: Secondary | ICD-10-CM | POA: Diagnosis not present

## 2018-02-23 DIAGNOSIS — Z923 Personal history of irradiation: Secondary | ICD-10-CM

## 2018-02-23 DIAGNOSIS — Z885 Allergy status to narcotic agent status: Secondary | ICD-10-CM

## 2018-02-23 DIAGNOSIS — E785 Hyperlipidemia, unspecified: Secondary | ICD-10-CM | POA: Diagnosis present

## 2018-02-23 DIAGNOSIS — R609 Edema, unspecified: Secondary | ICD-10-CM | POA: Diagnosis not present

## 2018-02-23 DIAGNOSIS — I2781 Cor pulmonale (chronic): Secondary | ICD-10-CM | POA: Diagnosis present

## 2018-02-23 DIAGNOSIS — N179 Acute kidney failure, unspecified: Secondary | ICD-10-CM | POA: Diagnosis present

## 2018-02-23 DIAGNOSIS — Z7984 Long term (current) use of oral hypoglycemic drugs: Secondary | ICD-10-CM | POA: Diagnosis not present

## 2018-02-23 DIAGNOSIS — K219 Gastro-esophageal reflux disease without esophagitis: Secondary | ICD-10-CM | POA: Diagnosis present

## 2018-02-23 DIAGNOSIS — Z7901 Long term (current) use of anticoagulants: Secondary | ICD-10-CM

## 2018-02-23 DIAGNOSIS — Z961 Presence of intraocular lens: Secondary | ICD-10-CM | POA: Diagnosis present

## 2018-02-23 DIAGNOSIS — I5032 Chronic diastolic (congestive) heart failure: Secondary | ICD-10-CM | POA: Diagnosis present

## 2018-02-23 DIAGNOSIS — I2510 Atherosclerotic heart disease of native coronary artery without angina pectoris: Secondary | ICD-10-CM | POA: Diagnosis present

## 2018-02-23 DIAGNOSIS — G4733 Obstructive sleep apnea (adult) (pediatric): Secondary | ICD-10-CM | POA: Diagnosis present

## 2018-02-23 DIAGNOSIS — Z7982 Long term (current) use of aspirin: Secondary | ICD-10-CM

## 2018-02-23 DIAGNOSIS — E1129 Type 2 diabetes mellitus with other diabetic kidney complication: Secondary | ICD-10-CM | POA: Diagnosis present

## 2018-02-23 DIAGNOSIS — Z809 Family history of malignant neoplasm, unspecified: Secondary | ICD-10-CM

## 2018-02-23 DIAGNOSIS — I482 Chronic atrial fibrillation: Secondary | ICD-10-CM | POA: Diagnosis not present

## 2018-02-23 DIAGNOSIS — Z841 Family history of disorders of kidney and ureter: Secondary | ICD-10-CM

## 2018-02-23 DIAGNOSIS — J42 Unspecified chronic bronchitis: Secondary | ICD-10-CM | POA: Diagnosis present

## 2018-02-23 DIAGNOSIS — J4550 Severe persistent asthma, uncomplicated: Secondary | ICD-10-CM | POA: Diagnosis present

## 2018-02-23 DIAGNOSIS — Z66 Do not resuscitate: Secondary | ICD-10-CM | POA: Diagnosis present

## 2018-02-23 DIAGNOSIS — I2721 Secondary pulmonary arterial hypertension: Secondary | ICD-10-CM | POA: Diagnosis not present

## 2018-02-23 DIAGNOSIS — Z6834 Body mass index (BMI) 34.0-34.9, adult: Secondary | ICD-10-CM

## 2018-02-23 DIAGNOSIS — Z9221 Personal history of antineoplastic chemotherapy: Secondary | ICD-10-CM

## 2018-02-23 DIAGNOSIS — Z87891 Personal history of nicotine dependence: Secondary | ICD-10-CM

## 2018-02-23 DIAGNOSIS — Z823 Family history of stroke: Secondary | ICD-10-CM

## 2018-02-23 HISTORY — PX: LEFT HEART CATH AND CORONARY ANGIOGRAPHY: CATH118249

## 2018-02-23 HISTORY — PX: RIGHT HEART CATH: CATH118263

## 2018-02-23 LAB — COMPREHENSIVE METABOLIC PANEL
ALT: 20 U/L (ref 14–54)
AST: 42 U/L — AB (ref 15–41)
Albumin: 2.5 g/dL — ABNORMAL LOW (ref 3.5–5.0)
Alkaline Phosphatase: 72 U/L (ref 38–126)
Anion gap: 17 — ABNORMAL HIGH (ref 5–15)
BUN: 29 mg/dL — AB (ref 6–20)
CHLORIDE: 105 mmol/L (ref 101–111)
CO2: 15 mmol/L — AB (ref 22–32)
Calcium: 8 mg/dL — ABNORMAL LOW (ref 8.9–10.3)
Creatinine, Ser: 1.6 mg/dL — ABNORMAL HIGH (ref 0.44–1.00)
GFR calc non Af Amer: 31 mL/min — ABNORMAL LOW (ref >60)
GFR, EST AFRICAN AMERICAN: 36 mL/min — AB (ref >60)
Glucose, Bld: 231 mg/dL — ABNORMAL HIGH (ref 65–99)
POTASSIUM: 4.6 mmol/L (ref 3.5–5.1)
SODIUM: 137 mmol/L (ref 135–145)
Total Bilirubin: 1.3 mg/dL — ABNORMAL HIGH (ref 0.3–1.2)
Total Protein: 6.2 g/dL — ABNORMAL LOW (ref 6.5–8.1)

## 2018-02-23 LAB — CBC WITH DIFFERENTIAL/PLATELET
BASOS ABS: 0 10*3/uL (ref 0.0–0.1)
Basophils Relative: 1 %
EOS ABS: 0.1 10*3/uL (ref 0.0–0.7)
Eosinophils Relative: 2 %
HCT: 29.9 % — ABNORMAL LOW (ref 36.0–46.0)
Hemoglobin: 8.7 g/dL — ABNORMAL LOW (ref 12.0–15.0)
Lymphocytes Relative: 33 %
Lymphs Abs: 2.1 10*3/uL (ref 0.7–4.0)
MCH: 24.6 pg — ABNORMAL LOW (ref 26.0–34.0)
MCHC: 29.1 g/dL — ABNORMAL LOW (ref 30.0–36.0)
MCV: 84.5 fL (ref 78.0–100.0)
MONO ABS: 0.2 10*3/uL (ref 0.1–1.0)
Monocytes Relative: 4 %
Neutro Abs: 3.8 10*3/uL (ref 1.7–7.7)
Neutrophils Relative %: 60 %
PLATELETS: 294 10*3/uL (ref 150–400)
RBC: 3.54 MIL/uL — AB (ref 3.87–5.11)
RDW: 19.1 % — AB (ref 11.5–15.5)
WBC: 6.3 10*3/uL (ref 4.0–10.5)

## 2018-02-23 LAB — I-STAT CG4 LACTIC ACID, ED: LACTIC ACID, VENOUS: 8.61 mmol/L — AB (ref 0.5–1.9)

## 2018-02-23 LAB — PREPARE RBC (CROSSMATCH)

## 2018-02-23 LAB — I-STAT TROPONIN, ED: TROPONIN I, POC: 0 ng/mL (ref 0.00–0.08)

## 2018-02-23 SURGERY — LEFT HEART CATH AND CORONARY ANGIOGRAPHY
Anesthesia: LOCAL

## 2018-02-23 MED ORDER — IOPAMIDOL (ISOVUE-370) INJECTION 76%
INTRAVENOUS | Status: AC
  Filled 2018-02-23: qty 150

## 2018-02-23 MED ORDER — INSULIN ASPART 100 UNIT/ML ~~LOC~~ SOLN
2.0000 [IU] | SUBCUTANEOUS | Status: DC
  Administered 2018-02-24: 4 [IU] via SUBCUTANEOUS

## 2018-02-23 MED ORDER — FAMOTIDINE 20 MG PO TABS
20.0000 mg | ORAL_TABLET | Freq: Every day | ORAL | Status: DC
  Administered 2018-02-24: 20 mg via ORAL
  Filled 2018-02-23: qty 1

## 2018-02-23 MED ORDER — NITROGLYCERIN 1 MG/10 ML FOR IR/CATH LAB
INTRA_ARTERIAL | Status: AC
  Filled 2018-02-23: qty 10

## 2018-02-23 MED ORDER — SODIUM CHLORIDE 0.9 % IV SOLN
INTRAVENOUS | Status: AC | PRN
  Administered 2018-02-23: 10 mL/h via INTRAVENOUS

## 2018-02-23 MED ORDER — HEPARIN BOLUS VIA INFUSION: 5000.0000 [IU] | Freq: Once | INTRAVENOUS | Status: DC

## 2018-02-23 MED ORDER — DABIGATRAN ETEXILATE MESYLATE 150 MG PO CAPS
150.0000 mg | ORAL_CAPSULE | Freq: Two times a day (BID) | ORAL | Status: DC
  Filled 2018-02-23: qty 1

## 2018-02-23 MED ORDER — SODIUM CHLORIDE 0.9 % IV SOLN: 250.0000 mL | INTRAVENOUS | Status: DC | PRN

## 2018-02-23 MED ORDER — SODIUM CHLORIDE 0.9 % IV BOLUS (SEPSIS)
1000.0000 mL | Freq: Once | INTRAVENOUS | Status: AC
  Administered 2018-02-23: 1000 mL via INTRAVENOUS

## 2018-02-23 MED ORDER — ALBUTEROL SULFATE (2.5 MG/3ML) 0.083% IN NEBU: 3.0000 mL | INHALATION_SOLUTION | RESPIRATORY_TRACT | Status: DC | PRN

## 2018-02-23 MED ORDER — VERAPAMIL HCL 2.5 MG/ML IV SOLN
INTRAVENOUS | Status: AC
  Filled 2018-02-23: qty 2

## 2018-02-23 MED ORDER — HEPARIN (PORCINE) IN NACL 2-0.9 UNIT/ML-% IJ SOLN
INTRAMUSCULAR | Status: AC | PRN
  Administered 2018-02-23 (×2): 500 mL

## 2018-02-23 MED ORDER — SODIUM CHLORIDE 0.9 % IV SOLN: Freq: Once | INTRAVENOUS | Status: DC

## 2018-02-23 MED ORDER — SODIUM CHLORIDE 0.9 % IV SOLN
INTRAVENOUS | Status: AC | PRN
Start: 2018-02-23 — End: 2018-02-23
  Administered 2018-02-23: 30 mL via INTRAVENOUS

## 2018-02-23 MED ORDER — LIDOCAINE HCL (PF) 1 % IJ SOLN
INTRAMUSCULAR | Status: AC
  Filled 2018-02-23: qty 30

## 2018-02-23 MED ORDER — ACETAMINOPHEN 325 MG PO TABS: 650.0000 mg | ORAL_TABLET | ORAL | Status: DC | PRN

## 2018-02-23 MED ORDER — SODIUM CHLORIDE 0.9% FLUSH: 3.0000 mL | INTRAVENOUS | Status: DC | PRN

## 2018-02-23 MED ORDER — HEPARIN SODIUM (PORCINE) 5000 UNIT/ML IJ SOLN: 5000.0000 [IU] | Freq: Three times a day (TID) | INTRAMUSCULAR | Status: DC

## 2018-02-23 MED ORDER — HEPARIN SODIUM (PORCINE) 5000 UNIT/ML IJ SOLN: 4000.0000 [IU] | Freq: Once | INTRAMUSCULAR | Status: DC

## 2018-02-23 MED ORDER — IOPAMIDOL (ISOVUE-370) INJECTION 76%
INTRAVENOUS | Status: DC | PRN
  Administered 2018-02-23: 15 mL via INTRA_ARTERIAL

## 2018-02-23 MED ORDER — BUDESONIDE 0.25 MG/2ML IN SUSP
0.2500 mg | Freq: Two times a day (BID) | RESPIRATORY_TRACT | Status: DC
  Administered 2018-02-24: 0.25 mg via RESPIRATORY_TRACT
  Filled 2018-02-23: qty 2

## 2018-02-23 MED ORDER — SODIUM CHLORIDE 0.9 % IV SOLN
INTRAVENOUS | Status: DC
  Administered 2018-02-24: via INTRAVENOUS

## 2018-02-23 MED ORDER — LIDOCAINE HCL (PF) 1 % IJ SOLN
INTRAMUSCULAR | Status: DC | PRN
  Administered 2018-02-23: 10 mL
  Administered 2018-02-23: 20 mL

## 2018-02-23 MED ORDER — ONDANSETRON HCL 4 MG/2ML IJ SOLN: 4.0000 mg | Freq: Four times a day (QID) | INTRAMUSCULAR | Status: DC | PRN

## 2018-02-23 MED ORDER — ASPIRIN 81 MG PO CHEW: 324.0000 mg | CHEWABLE_TABLET | Freq: Once | ORAL | Status: DC

## 2018-02-23 MED ORDER — SODIUM CHLORIDE 0.9 % IV SOLN: INTRAVENOUS | Status: DC

## 2018-02-23 MED ORDER — HEPARIN SODIUM (PORCINE) 5000 UNIT/ML IJ SOLN: 5000.0000 [IU] | Freq: Once | INTRAMUSCULAR | Status: DC

## 2018-02-23 MED ORDER — ARFORMOTEROL TARTRATE 15 MCG/2ML IN NEBU
15.0000 ug | INHALATION_SOLUTION | Freq: Two times a day (BID) | RESPIRATORY_TRACT | Status: DC
  Administered 2018-02-24: 15 ug via RESPIRATORY_TRACT
  Filled 2018-02-23: qty 2

## 2018-02-23 MED ORDER — SODIUM CHLORIDE 0.9% FLUSH
3.0000 mL | Freq: Two times a day (BID) | INTRAVENOUS | Status: DC
  Administered 2018-02-24: 3 mL via INTRAVENOUS

## 2018-02-23 MED ORDER — HEPARIN (PORCINE) IN NACL 2-0.9 UNIT/ML-% IJ SOLN
INTRAMUSCULAR | Status: AC
  Filled 2018-02-23: qty 1000

## 2018-02-23 SURGICAL SUPPLY — 18 items
CATH INFINITI 5FR JL4 (CATHETERS) ×2 IMPLANT
CATH SWAN GANZ 7F STRAIGHT (CATHETERS) ×2 IMPLANT
CATH VISTA GUIDE 6FR JR4 (CATHETERS) ×2 IMPLANT
COVER PRB 48X5XTLSCP FOLD TPE (BAG) ×1 IMPLANT
COVER PROBE 5X48 (BAG) ×1
GLIDESHEATH SLEND A-KIT 6F 22G (SHEATH) IMPLANT
GUIDEWIRE INQWIRE 1.5J.035X260 (WIRE) IMPLANT
INQWIRE 1.5J .035X260CM (WIRE)
KIT ENCORE 26 ADVANTAGE (KITS) ×4 IMPLANT
KIT HEART LEFT (KITS) ×2 IMPLANT
KIT MICROPUNCTURE NIT STIFF (SHEATH) ×2 IMPLANT
PACK CARDIAC CATHETERIZATION (CUSTOM PROCEDURE TRAY) ×2 IMPLANT
SHEATH PINNACLE 6F 10CM (SHEATH) ×2 IMPLANT
SHEATH PINNACLE 7F 10CM (SHEATH) ×2 IMPLANT
TRANSDUCER W/STOPCOCK (MISCELLANEOUS) ×2 IMPLANT
TUBING CIL FLEX 10 FLL-RA (TUBING) ×2 IMPLANT
WIRE EMERALD 3MM-J .025X260CM (WIRE) ×2 IMPLANT
WIRE EMERALD 3MM-J .035X150CM (WIRE) ×2 IMPLANT

## 2018-02-23 NOTE — ED Notes (Signed)
Pt desatting to 80's on 7L [place], MD Ray made aware, pt paced on NRB at this time w/ improvement to high 80s

## 2018-02-23 NOTE — Interval H&P Note (Signed)
History and Physical Interval Note:  02/28/2018 9:15 PM  Lisa Walters  has presented today for surgery, with the diagnosis of STEMI  The various methods of treatment have been discussed with the patient and family. After consideration of risks, benefits and other options for treatment, the patient has consented to  Procedure(s): Coronary/Graft Acute MI Revascularization (N/A) LEFT HEART CATH AND CORONARY ANGIOGRAPHY (N/A) as a surgical intervention .  The patient's history has been reviewed, patient examined, no change in status, stable for surgery.  I have reviewed the patient's chart and labs.  Questions were answered to the patient's satisfaction.      2016 Appropriate Use Criteria for Coronary Revascularization in Patients With Acute Coronary Syndrome STEMI Revascularization Strategy Time since STEMI symptom onset  Link Here: [email or web address] Patient Information: Revascularization Strategy Immediate Revascularization by PCI  Time since STEMI symptom onset <=12 hours  Indication:  Revascularization of the Presumed STEMI Culprit Artery by Primary PCI <=12 hours after symptom onset A (9) Indication: 1; Score 9 10319 Indication:  Immediate Revascularization of 1 or more Non-culprit Arteries during the Same Procedure Following Successful Treatment of the STEMI Culprit Artery by Primary PCI.  PCI or CABG of 1 or more additional vessels for Cardiogenic Shock Persisting after PCI of the presumed culprit artery A (8) Indication: 4; Score 8     [place]

## 2018-02-23 NOTE — ED Notes (Signed)
Pt desatting to 75% on NRB, per verbal order by MD Ray placed on BiPAP by resp therapy

## 2018-02-23 NOTE — Consult Note (Addendum)
Reason for Consult: Chest pain Referring Physician: [facility]  Lisa Walters is an 77 y.o. female.  HPI:   77 year old African-American female with hypertension, hyperlipidemia, type II diabetes mellitus,persistent Afib (CHA2DS2VASc score 4), on pradaxa,moderate obesity, severe persistent asthma on home oxygen, WHO group II/III pulmonary hypertension FC II-III, h/o breast cancer.   Patient presented to the ED on 03/24/2018 evening with 9 out of 10 chest pain and respiratory distress. EKG showed ST elevation in aVR and V1, which were new compared to her baseline EKG. Code STEMI was called and patient was brought to the tract. Given patient's baseline DO NOT RESUSCITATE status, I discussed this with patient and family. Patient opted to proceed with the heart catheterization and reverse her CODE STATUS for the procedure.  Right and left heart catheterization showed nonobstructive coronary artery disease, unchanged compared to a prior cath in October 2018. Severe pulmonary hypertension, with intact RV systolic function. Cardiac output was 5.9 L/m, cardiac index 3.1 L/m/m, PA Sats 64%, PAPi 2.8 indicating preserved RV systolic function.   Blood work shows severe metabolic acidosis with lactic acid of 8.7. At the end of the procedure, patient is complaining of no chest pain or abdominal pain. Right femoral artery and venous sheath is sutured in place given elevated ACT in spite of no heparin given.  Given no cardiogenic shock or STEMI on coronary angiogram, her clinical picture appears to be that of metabolic acidosis, either due to sepsis or diabetic ketoacidosis. I have requested critical care to admit the patient for further care.  Past Medical History:  Diagnosis Date  . Anemia   . Arthritis    "all over" (06/02/2013)  . Asthma   . Breast cancer ([place]) 2007   left  . CHF (congestive heart failure) ([place])   . Chronic bronchitis ([place])   . Chronic lower back pain   . Distal radial fracture  05/26/2013   right "fell" (06/02/2013)  . Exertional shortness of breath    "most of the time" (06/02/2013)  . GERD (gastroesophageal reflux disease)   . H/O hiatal hernia   . History of blood transfusion 2007   "related to breast cancer" (06/02/2013)  . Hyperlipidemia   . Hypertension   . Irregular heart beat   . Migraines    "years ago" (06/02/2013)  . On home oxygen therapy    "2L at night when I go to bed" (06/03/2103)  . Osteopenia   . Pneumonia    "walking pneumonia once; hospitalized 2nd time I had pneumonia" (06/02/2013)  . Sleep apnea   . Type II diabetes mellitus ([place])   . Urge incontinence   . Vertigo, benign positional   . Vitamin D deficiency disease     Past Surgical History:  Procedure Laterality Date  . BREAST BIOPSY Left 2007  . BREAST LUMPECTOMY Left 2007  . CARDIAC CATHETERIZATION  07/2012  . CATARACT EXTRACTION W/ INTRAOCULAR LENS  IMPLANT, BILATERAL    . INTRAVASCULAR PRESSURE WIRE/FFR STUDY N/A 09/25/2017   Procedure: INTRAVASCULAR PRESSURE WIRE/FFR STUDY;  Surgeon: Nigel Mormon, MD;  Location: [facility];  Service: Cardiovascular;  Laterality: N/A;  . JOINT REPLACEMENT    . KNEE ARTHROSCOPY Left   . LEFT HEART CATHETERIZATION WITH CORONARY ANGIOGRAM Right 08/02/2012   Procedure: LEFT HEART CATHETERIZATION WITH CORONARY ANGIOGRAM;  Surgeon: Birdie Riddle, MD;  Location: [facility];  Service: Cardiovascular;  Laterality: Right;  . MEDIAL PARTIAL KNEE REPLACEMENT Left   . RIGHT/LEFT HEART CATH  AND CORONARY ANGIOGRAPHY N/A 09/25/2017   Procedure: RIGHT/LEFT HEART CATH AND CORONARY ANGIOGRAPHY;  Surgeon: Nigel Mormon, MD;  Location: [facility];  Service: Cardiovascular;  Laterality: N/A;  . TOTAL KNEE ARTHROPLASTY Bilateral   . TUBAL LIGATION      Family History  Problem Relation Age of Onset  . Stroke Father   . Emphysema Brother   . Cancer Sister        brain tumor  . Kidney disease Mother   . Asthma Brother     Social History:   reports that she quit smoking about 57 years ago. Her smoking use included cigarettes. She has a 0.50 pack-year smoking history. she has never used smokeless tobacco. She reports that she drinks alcohol. She reports that she does not use drugs.  Allergies:  Allergies  Allergen Reactions  . Codeine Other (See Comments)    Unknown  . Sulfa Antibiotics Other (See Comments)    Unknown    Medications: I have reviewed the patient's current medications.  Results for orders placed or performed during the hospital encounter of 03/08/2018 (from the past 48 hour(s))  CBC with Differential/Platelet     Status: Abnormal   Collection Time: 03/15/2018  8:00 PM  Result Value Ref Range   WBC 6.3 4.0 - 10.5 K/uL   RBC 3.54 (L) 3.87 - 5.11 MIL/uL   Hemoglobin 8.7 (L) 12.0 - 15.0 g/dL   HCT 29.9 (L) 36.0 - 46.0 %   MCV 84.5 78.0 - 100.0 fL   MCH 24.6 (L) 26.0 - 34.0 pg   MCHC 29.1 (L) 30.0 - 36.0 g/dL   RDW 19.1 (H) 11.5 - 15.5 %   Platelets 294 150 - 400 K/uL   Neutrophils Relative % 60 %   Neutro Abs 3.8 1.7 - 7.7 K/uL   Lymphocytes Relative 33 %   Lymphs Abs 2.1 0.7 - 4.0 K/uL   Monocytes Relative 4 %   Monocytes Absolute 0.2 0.1 - 1.0 K/uL   Eosinophils Relative 2 %   Eosinophils Absolute 0.1 0.0 - 0.7 K/uL   Basophils Relative 1 %   Basophils Absolute 0.0 0.0 - 0.1 K/uL    Comment: Performed at [facility], [address], [place],  [address]  Comprehensive metabolic panel     Status: Abnormal   Collection Time: 03/14/2018  8:00 PM  Result Value Ref Range   Sodium 137 135 - 145 mmol/L   Potassium 4.6 3.5 - 5.1 mmol/L   Chloride 105 101 - 111 mmol/L   CO2 15 (L) 22 - 32 mmol/L   Glucose, Bld 231 (H) 65 - 99 mg/dL   BUN 29 (H) 6 - 20 mg/dL   Creatinine, Ser 1.60 (H) 0.44 - 1.00 mg/dL   Calcium 8.0 (L) 8.9 - 10.3 mg/dL   Total Protein 6.2 (L) 6.5 - 8.1 g/dL   Albumin 2.5 (L) 3.5 - 5.0 g/dL   AST 42 (H) 15 - 41 U/L   ALT 20 14 - 54 U/L   Alkaline Phosphatase 72 38 - 126 U/L    Total Bilirubin 1.3 (H) 0.3 - 1.2 mg/dL   GFR calc non Af Amer 31 (L) >60 mL/min   GFR calc Af Amer 36 (L) >60 mL/min    Comment: (NOTE) The eGFR has been calculated using the CKD EPI equation. This calculation has not been validated in all clinical situations. eGFR's persistently <60 mL/min signify possible Chronic Kidney Disease.    Anion gap 17 (  H) 5 - 15    Comment: Performed at [facility], [place] [address], [place], [place] [address]  Type and screen [place]     Status: None (Preliminary result)   Collection Time: 03/16/2018  8:01 PM  Result Value Ref Range   ABO/RH(D) O POS    Antibody Screen NEG    Sample Expiration      02/26/2018 Performed at [facility], [place] [address] [place], [place] [address]    Unit Number [id number]    Blood Component Type RBC LR PHER1    Unit division 00    Status of Unit ALLOCATED    Transfusion Status OK TO TRANSFUSE    Crossmatch Result Compatible    Unit Number [id number]    Blood Component Type RED CELLS,LR    Unit division 00    Status of Unit ALLOCATED    Transfusion Status OK TO TRANSFUSE    Crossmatch Result Compatible    Unit Number [id number]    Blood Component Type RBC LR PHER2    Unit division 00    Status of Unit ALLOCATED    Transfusion Status OK TO TRANSFUSE    Crossmatch Result Compatible   Prepare RBC     Status: None   Collection Time: 02/26/2018  8:01 PM  Result Value Ref Range   Order Confirmation      ORDER PROCESSED BY BLOOD BANK Performed at [facility], [place] [address], [place], [place] [address]   I-stat troponin, ED     Status: None   Collection Time: 03/08/2018  8:16 PM  Result Value Ref Range   Troponin i, poc 0.00 0.00 - 0.08 ng/mL   Comment 3            Comment: Due to the release kinetics of cTnI, a negative result within the first hours of the onset of symptoms does not rule out myocardial infarction with certainty. If myocardial infarction is still  suspected, repeat the test at appropriate intervals.   I-Stat CG4 Lactic Acid, ED     Status: Abnormal   Collection Time: 03/11/2018  8:17 PM  Result Value Ref Range   Lactic Acid, Venous 8.61 (HH) 0.5 - 1.9 mmol/L   Comment NOTIFIED PHYSICIAN   ABO/Rh     Status: None (Preliminary result)   Collection Time: 03/04/2018  8:28 PM  Result Value Ref Range   ABO/RH(D)      O POS Performed at [facility] [address], [place], [place] [address]     Dg Chest Port 1 View  Result Date: 02/26/2018 CLINICAL DATA:  Dyspnea.  Chest pain. EXAM: PORTABLE CHEST 1 VIEW COMPARISON:  Radiographs 01/29/2018 FINDINGS: Mild cardiomegaly which is similar to prior exam. There is aortic atherosclerosis. Unchanged minimal vascular congestion. Small left greater than right pleural effusions, new. No confluent consolidation. No pneumothorax. No acute osseous abnormalities are seen. Surgical clips in the left axilla. IMPRESSION: Small bilateral pleural effusions, new from prior exam. Stable cardiomegaly and vascular congestion. Electronically Signed   By: Jeb Levering M.D.   On: 03/11/2018 21:00    Review of Systems  Constitutional: Positive for malaise/fatigue.  HENT: Negative.   Respiratory: Positive for shortness of breath (Now improved, currently on supplemental oxygen.).   Cardiovascular: Positive for chest pain (Now resolved) and leg swelling.  Gastrointestinal: Negative for abdominal pain, nausea and vomiting.  Genitourinary: Negative.   Musculoskeletal: Negative.   Skin: Negative for rash.  Neurological: Negative for dizziness, loss of consciousness and headaches.  Endo/Heme/Allergies: Does not bruise/bleed easily.  Psychiatric/Behavioral: The patient is nervous/anxious.   All other systems reviewed and are negative.  Blood pressure (!) 119/59, pulse (!) 0, temperature 98.5 F (36.9 C), temperature source Rectal, resp. rate 17, height _0  (1.6 m), weight 87.5 kg (193 lb), SpO2 100  %. Physical Exam  Nursing note and vitals reviewed. Constitutional: She is oriented to person, place, and time. She appears well-developed and well-nourished. She appears distressed.  HENT:  Head: Normocephalic and atraumatic.  Eyes: Conjunctivae are normal. Pupils are equal, round, and reactive to light.  Neck: Normal range of motion. Neck supple. JVD present.  Cardiovascular:  Murmur (II/VI holosystolic RLSB) heard. Irregularly irregular, tachycardic No carotid bruit Intact peripheral pulses  Musculoskeletal: She exhibits edema (1+ b/l).  Lymphadenopathy:    She has no cervical adenopathy.  Neurological: She is alert and oriented to person, place, and time.  Skin: Skin is warm and dry.  Psychiatric: She has a normal mood and affect.    Assessment: 77 year old African-American female with hypertension, hyperlipidemia, type II diabetes mellitus,persistent Afib (CHA2DS2VASc score 4), on pradaxa,moderate obesity, severe persistent asthma on home oxygen, WHO group II/III pulmonary hypertension FC II-III, h/o breast cancer. Admitted with chest pain and shortness of breath.  Chest pain: Now resolved. EKG did show mild ST elevation in aVR and V1, but coronary angiogram shows mild nonobstructive CAD Shortness of breath: Multifactorial, combination of underlying pulmonary hypertension, Small pleural effusions Metabolic acidosis: Infectious vs DKA Afib CHA2DS2Vasc score 4: On Pradaxa. Rate controlled Pulmonary hypertension: WHO Grp II/III Severe persistent asthma, on home O2 Type 20M H/o breast cancer H/o hypertension hyperlipidemia  Recommendations: Admit to ICU in the critical care service. Continue baseline Pradaxa. May hold antihypertensive agents in light of hypertension. Routine echocardiogram.  Manish J Patwardhan 03/06/2018, 10:54 PM   [place], MD [facility] [place] Pager: [phone] Office: [phone] If no answer Cell [phone]

## 2018-02-23 NOTE — ED Provider Notes (Addendum)
[facility] EMERGENCY DEPARTMENT Provider Note   CSN: [id number] Arrival date & time: 02/26/2018  1942     History   Chief Complaint Chief Complaint  Patient presents with  . Chest Pain    HPI Lisa Walters is a 77 y.o. female. Of all 5 caveat secondary to severe illness and need for intervention HPI 77 year old female history of breast cancer, anemia, CHF presents today with onset of chest pain, hypotension, and weakness.  Per EMS report, patient was hypotensive on their arrival with a systolic blood pressure of 72.  She received 500 cc of normal saline and the report heart rate increased to 130.  They report a rhythm consistent with atrial fibrillation with a rate of 90.  They report that she has recently been started on digoxin.  Patient states that she became weak and dizzy with chest pain and sudden onset today.  She reports she has had some cough prior to this.  She is complaining of feeling generally weak and dyspneic currently. Past Medical History:  Diagnosis Date  . Anemia   . Arthritis    "all over" (06/02/2013)  . Asthma   . Breast cancer (Lisa Walters) 2007   left  . CHF (congestive heart failure) ([place])   . Chronic bronchitis ([place])   . Chronic lower back pain   . Distal radial fracture 05/26/2013   right "fell" (06/02/2013)  . Exertional shortness of breath    "most of the time" (06/02/2013)  . GERD (gastroesophageal reflux disease)   . H/O hiatal hernia   . History of blood transfusion 2007   "related to breast cancer" (06/02/2013)  . Hyperlipidemia   . Hypertension   . Irregular heart beat   . Migraines    "years ago" (06/02/2013)  . On home oxygen therapy    "2L at night when I go to bed" (06/03/2103)  . Osteopenia   . Pneumonia    "walking pneumonia once; hospitalized 2nd time I had pneumonia" (06/02/2013)  . Sleep apnea   . Type II diabetes mellitus (Lisa Walters)   . Urge incontinence   . Vertigo, benign positional   . Vitamin D deficiency disease     Patient  Active Problem List   Diagnosis Date Noted  . CHF exacerbation (Lisa Walters) 12/31/2017  . Pulmonary hypertension, moderate to severe (Lisa Walters)   . Pulmonary hypertension (Lisa Walters) 09/23/2017  . Asthma 07/26/2017  . Congestive heart failure (CHF) (Lisa Walters) 07/26/2017  . OSA on CPAP 07/26/2017  . Atrial fibrillation (Lisa Walters) 07/26/2017  . Chronic respiratory failure with hypoxia (Lisa Walters) 07/26/2017  . Hyperlipidemia 07/26/2017  . GERD (gastroesophageal reflux disease) 07/26/2017  . Diabetes mellitus ([place]) 07/26/2017  . Osteopenia   . Vitamin D deficiency disease   . Breast cancer (Lisa Walters)   . Irregular heart beat     Past Surgical History:  Procedure Laterality Date  . BREAST BIOPSY Left 2007  . BREAST LUMPECTOMY Left 2007  . CARDIAC CATHETERIZATION  07/2012  . CATARACT EXTRACTION W/ INTRAOCULAR LENS  IMPLANT, BILATERAL    . INTRAVASCULAR PRESSURE WIRE/FFR STUDY N/A 09/25/2017   Procedure: INTRAVASCULAR PRESSURE WIRE/FFR STUDY;  Surgeon: Nigel Mormon, MD;  Location: [facility];  Service: Cardiovascular;  Laterality: N/A;  . JOINT REPLACEMENT    . KNEE ARTHROSCOPY Left   . LEFT HEART CATHETERIZATION WITH CORONARY ANGIOGRAM Right 08/02/2012   Procedure: LEFT HEART CATHETERIZATION WITH CORONARY ANGIOGRAM;  Surgeon: Birdie Riddle, MD;  Location: [facility];  Service: Cardiovascular;  Laterality: Right;  . MEDIAL PARTIAL KNEE REPLACEMENT Left   . RIGHT/LEFT HEART CATH AND CORONARY ANGIOGRAPHY N/A 09/25/2017   Procedure: RIGHT/LEFT HEART CATH AND CORONARY ANGIOGRAPHY;  Surgeon: Nigel Mormon, MD;  Location: [facility];  Service: Cardiovascular;  Laterality: N/A;  . TOTAL KNEE ARTHROPLASTY Bilateral   . TUBAL LIGATION      OB History    Gravida Para Term Preterm AB Living   4 4 4     4    SAB TAB Ectopic Multiple Live Births                   Home Medications    Prior to Admission medications   Medication Sig Start Date End Date Taking? Authorizing Provider  acetaminophen  (TYLENOL) 500 MG tablet Take 500-1,000 mg by mouth every 6 (six) hours as needed (back).    [provider]  albuterol (PROVENTIL HFA;VENTOLIN HFA) 108 (90 Base) MCG/ACT inhaler Inhale 2 puffs into the lungs every 4 (four) hours as needed for wheezing or shortness of breath. 09/19/17   Javier Glazier, MD  albuterol (PROVENTIL) (5 MG/ML) 0.5% nebulizer solution Take 2.5 mg by nebulization every 6 (six) hours as needed for wheezing or shortness of breath.     [provider]  aspirin EC 81 MG EC tablet Take 1 tablet (81 mg total) by mouth daily. 01/05/18   Alma Friendly, MD  atorvastatin (LIPITOR) 20 MG tablet Take 20 mg by mouth daily at 6 PM.  06/25/17   [provider]  budesonide-formoterol (SYMBICORT) 160-4.5 MCG/ACT inhaler Inhale 2 puffs into the lungs 2 (two) times daily. 07/26/17   Javier Glazier, MD  cholecalciferol (VITAMIN D) 1000 UNITS tablet Take 1,000 Units by mouth daily.    [provider]  furosemide (LASIX) 40 MG tablet Take 1 tablet (40 mg total) by mouth 2 (two) times daily. Patient taking differently: Take 40 mg by mouth 4 (four) times daily.  01/04/18   Alma Friendly, MD  meclizine (ANTIVERT) 12.5 MG tablet Take 12.5 mg by mouth every 6 (six) hours as needed for dizziness (for vertigo).     [provider]  metFORMIN (GLUCOPHAGE) 500 MG tablet Take 500 mg by mouth 2 (two) times daily with a meal.    [provider]  metoprolol (TOPROL-XL) 200 MG 24 hr tablet Take 200 mg by mouth daily. 12/17/17   [provider]  montelukast (SINGULAIR) 10 MG tablet Take 10 mg by mouth every morning.     [provider]  potassium chloride SA (K-DUR,KLOR-CON) 20 MEQ tablet Take 20 mEq by mouth 2 (two) times daily.     [provider]  PRADAXA 150 MG CAPS capsule Take 150 mg by mouth 2 (two) times daily.  06/14/17   [provider]  ranitidine (ZANTAC) 150 MG tablet Take 1 tablet (150 mg total)  by mouth at bedtime. 10/22/17   Javier Glazier, MD  traMADol (ULTRAM) 50 MG tablet Take 1 tablet (50 mg total) by mouth every 6 (six) hours as needed. 01/29/18   Fredia Sorrow, MD    Family History Family History  Problem Relation Age of Onset  . Stroke Father   . Emphysema Brother   . Cancer Sister        brain tumor  . Kidney disease Mother   . Asthma Brother     Social History Social History   Tobacco Use  . Smoking status: Former Smoker  Packs/day: 0.10    Years: 5.00    Pack years: 0.50    Types: Cigarettes    Last attempt to quit: 12/25/1960    Years since quitting: 57.2  . Smokeless tobacco: Never Used  Substance Use Topics  . Alcohol use: Yes    Comment: 06/02/2013 "quit drinking ~ 35 yr ago; never had problem w/it"  . Drug use: No     Allergies   Codeine and Sulfa antibiotics   Review of Systems Review of Systems  All other systems reviewed and are negative.    Physical Exam Updated Vital Signs BP (!) 58/47   Resp (!) 35   Ht 1.6 m (5\' 3" )   Wt 87.5 kg (193 lb)   BMI 34.19 kg/m   Physical Exam  Constitutional: She is oriented to person, place, and time. She appears well-developed. She appears ill.  HENT:  Head: Normocephalic.  Eyes: Pupils are equal, round, and reactive to light.  Cardiovascular: Normal heart sounds. An irregularly irregular rhythm present.  Pulses:      Carotid pulses are 1+ on the right side.      Radial pulses are 0 on the right side.       Femoral pulses are 1+ on the right side. Pulmonary/Chest:  Increased work of breathing decreased breath sounds throughout  Abdominal: Soft.  Musculoskeletal:  Trace edema bilateral lower legs  Neurological: She is alert and oriented to person, place, and time. No cranial nerve deficit. Coordination normal.  Skin:  Cool  Psychiatric: She has a normal mood and affect.  Nursing note and vitals reviewed.    ED Treatments / Results  Labs (all labs ordered are listed, but only  abnormal results are displayed) Labs Reviewed  CULTURE, BLOOD (ROUTINE X 2)  CULTURE, BLOOD (ROUTINE X 2)  CBC WITH DIFFERENTIAL/PLATELET  COMPREHENSIVE METABOLIC PANEL  DIGOXIN LEVEL  I-STAT TROPONIN, ED  I-STAT ARTERIAL BLOOD GAS, ED  I-STAT CG4 LACTIC ACID, ED    EKG  EKG Interpretation  Date/Time:  Saturday February 23 2018 19:59:46 EST Ventricular Rate:  95 PR Interval:    QRS Duration: 78 QT Interval:  313 QTC Calculation: 394 R Axis:   100 Text Interpretation:  Atrial fibrillation Low voltage, precordial leads Probable right ventricular hypertrophy Nonspecific repol abnormality, lateral leads ** ** ACUTE MI / STEMI ** ** Confirmed by Pattricia Boss [phone]) on  8:18:58 PM       Radiology No results found.  Procedures Procedures (including critical care time)  Medications Ordered in ED Medications  sodium chloride 0.9 % bolus 1,000 mL (not administered)     Initial Impression / Assessment and Plan / ED Course  I have reviewed the triage vital signs and the nursing notes.  Pertinent labs & imaging results that were available during my care of the patient were reviewed by me and considered in my medical decision making (see chart for details).     77 y.o. Female with recent hemoglobin here of 8.4 Patient with st elevation avr and v1 with some reciprocal changes lateral leads STEMI paged Discussed with Dr. Ellyn Hack. Given patient's recent low hemoglobin, will hold heparin at this time pending cardiology consult. Patient is patient of Dr. Virgina Jock and Dr.Ganji is being consulted.  Dr. Virgina Jock is en route.  Patient is temporized with bipap.  Per Dr.Patwardhan patient is dnr.   SBP now 70  Patient being taken to cath lab CRITICAL CARE Performed by: Pattricia Boss Total critical care time: 45 minutes Critical  care time was exclusive of separately billable procedures and treating other patients. Critical care was necessary to treat or prevent imminent or  life-threatening deterioration. Critical care was time spent personally by me on the following activities: development of treatment plan with patient and/or surrogate as well as nursing, discussions with consultants, evaluation of patient's response to treatment, examination of patient, obtaining history from patient or surrogate, ordering and performing treatments and interventions, ordering and review of laboratory studies, ordering and review of radiographic studies, pulse oximetry and re-evaluation of patient's condition.   Final Clinical Impressions(s) / ED Diagnoses   Final diagnoses:  ST elevation myocardial infarction (STEMI), unspecified artery [facility])    ED Discharge Orders    None       Pattricia Boss, MD 03/20/2018 Lynnell Catalan    Pattricia Boss, MD 03/20/18 [id number]

## 2018-02-23 NOTE — Consult Note (Addendum)
PULMONARY / CRITICAL CARE MEDICINE   Name: Lisa Walters MRN: [id number] DOB: April 21, 1941    ADMISSION DATE:  02/25/2018 CONSULTATION DATE:  03-19-2018  REFERRING MD:  Dr. Virgina Jock   CHIEF COMPLAINT:  Hypotension   HISTORY OF PRESENT ILLNESS:   77 year old female with PMH of HTN, HLD, DM, A.Fib on Pradaxa, Diastolic HF, Severe PHTN, Chronic Hypoxic on 3L [place], OSA on CPAP at HS (states she has not been using this recently), Breast Cancer 2005 treated with chemo/radiation and lymph node removal  Presents to ED on 3/2 with with hypotension, weakness, and tachycardia. LA 8.7. EKG with ST elevation in AVR and V1. Taken to [facility]. Right/Left Heart Cath with nonobstructive CAD, severe pulmonary HTN with intact RV systolic function. Sent to ICU for further work-up post-cath.   Patient states that she has had productive cough with brown sputum production and ongoing dyspnea for the last month as well as noted swelling in BLE in which at times is painful. Denies missing dose of Pradaxa. Recent admission 1/7-1/11 with dyspnea and lower extremity edema. Treated for CHF exacerbation.   PAST MEDICAL HISTORY :  She  has a past medical history of Anemia, Arthritis, Asthma, Breast cancer ([place]) (2007), CHF (congestive heart failure) ([place]), Chronic bronchitis ([place]), Chronic lower back pain, Distal radial fracture (05/26/2013), Exertional shortness of breath, GERD (gastroesophageal reflux disease), H/O hiatal hernia, History of blood transfusion (2007), Hyperlipidemia, Hypertension, Irregular heart beat, Migraines, On home oxygen therapy, Osteopenia, Pneumonia, Sleep apnea, Type II diabetes mellitus ([place]), Urge incontinence, Vertigo, benign positional, and Vitamin D deficiency disease.  PAST SURGICAL HISTORY: She  has a past surgical history that includes Tubal ligation; Medial partial knee replacement (Left); Total knee arthroplasty (Bilateral); Knee arthroscopy (Left); Joint replacement; Breast lumpectomy (Left,  2007); Breast biopsy (Left, 2007); Cataract extraction w/ intraocular lens  implant, bilateral; Cardiac catheterization (07/2012); left heart catheterization with coronary angiogram (Right, 08/02/2012); RIGHT/LEFT HEART CATH AND CORONARY ANGIOGRAPHY (N/A, 09/25/2017); and INTRAVASCULAR PRESSURE WIRE/FFR STUDY (N/A, 09/25/2017).  Allergies  Allergen Reactions  . Codeine Other (See Comments)    Unknown  . Sulfa Antibiotics Other (See Comments)    Unknown    No current facility-administered medications on file prior to encounter.    Current Outpatient Medications on File Prior to Encounter  Medication Sig  . acetaminophen (TYLENOL) 500 MG tablet Take 500-1,000 mg by mouth every 6 (six) hours as needed (back).  Marland Kitchen albuterol (PROVENTIL HFA;VENTOLIN HFA) 108 (90 Base) MCG/ACT inhaler Inhale 2 puffs into the lungs every 4 (four) hours as needed for wheezing or shortness of breath.  Marland Kitchen albuterol (PROVENTIL) (5 MG/ML) 0.5% nebulizer solution Take 2.5 mg by nebulization every 6 (six) hours as needed for wheezing or shortness of breath.   Marland Kitchen aspirin EC 81 MG EC tablet Take 1 tablet (81 mg total) by mouth daily.  Marland Kitchen atorvastatin (LIPITOR) 20 MG tablet Take 20 mg by mouth daily at 6 PM.   . budesonide-formoterol (SYMBICORT) 160-4.5 MCG/ACT inhaler Inhale 2 puffs into the lungs 2 (two) times daily.  . cholecalciferol (VITAMIN D) 1000 UNITS tablet Take 1,000 Units by mouth daily.  . furosemide (LASIX) 40 MG tablet Take 1 tablet (40 mg total) by mouth 2 (two) times daily. (Patient taking differently: Take 40 mg by mouth 4 (four) times daily. )  . meclizine (ANTIVERT) 12.5 MG tablet Take 12.5 mg by mouth every 6 (six) hours as needed for dizziness (for vertigo).   . metFORMIN (GLUCOPHAGE) 500 MG tablet Take  500 mg by mouth 2 (two) times daily with a meal.  . metoprolol (TOPROL-XL) 200 MG 24 hr tablet Take 200 mg by mouth daily.  . montelukast (SINGULAIR) 10 MG tablet Take 10 mg by mouth every morning.   . potassium  chloride SA (K-DUR,KLOR-CON) 20 MEQ tablet Take 20 mEq by mouth 2 (two) times daily.   Marland Kitchen PRADAXA 150 MG CAPS capsule Take 150 mg by mouth 2 (two) times daily.   . ranitidine (ZANTAC) 150 MG tablet Take 1 tablet (150 mg total) by mouth at bedtime.  . traMADol (ULTRAM) 50 MG tablet Take 1 tablet (50 mg total) by mouth every 6 (six) hours as needed.    FAMILY HISTORY:  Her indicated that her mother is deceased. She indicated that her father is deceased. She indicated that the status of her sister is unknown. She indicated that both of her brothers are deceased.   SOCIAL HISTORY: She  reports that she quit smoking about 57 years ago. Her smoking use included cigarettes. She has a 0.50 pack-year smoking history. she has never used smokeless tobacco. She reports that she drinks alcohol. She reports that she does not use drugs.  REVIEW OF SYSTEMS:   All negative; except for those that are bolded, which indicate positives.  Constitutional: weight loss, weight gain, night sweats, fevers, chills, fatigue, weakness.  HEENT: headaches, sore throat, sneezing, nasal congestion, post nasal drip, difficulty swallowing, tooth/dental problems, visual complaints, visual changes, ear aches. Neuro: difficulty with speech, weakness, numbness, ataxia. CV:  chest pain, orthopnea, PND, swelling in lower extremities, dizziness, palpitations, syncope.  Resp: cough, hemoptysis, dyspnea, wheezing. GI: heartburn, indigestion, abdominal pain, nausea, vomiting, diarrhea, constipation, change in bowel habits, loss of appetite, hematemesis, melena, hematochezia.  GU: dysuria, change in color of urine, urgency or frequency, flank pain, hematuria. MSK: joint pain or swelling, decreased range of motion. Psych: change in mood or affect, depression, anxiety, suicidal ideations, homicidal ideations. Skin: rash, itching, bruising.   SUBJECTIVE:  VITAL SIGNS: BP (!) 119/59   Pulse (!) 0   Temp 98.5 F (36.9 C) (Rectal)    Resp 17   Ht 5\' 3"  (1.6 m)   Wt 87.5 kg (193 lb)   SpO2 100%   BMI 34.19 kg/m   HEMODYNAMICS:    VENTILATOR SETTINGS: FiO2 (%):  [100 %] 100 %  INTAKE / OUTPUT: No intake/output data recorded.  PHYSICAL EXAMINATION: General:  Elderly female, no distress  Neuro:  Alert, oriented, follows commands  HEENT:  Dry MM  Cardiovascular:  Irregular, rate 100-110 Lungs:  Diminished to bases, no wheeze/crackles  Abdomen:  Non-distended, active bowel sounds  Musculoskeletal:  +3 BLE  Skin:  Warm, dry   LABS:  BMET Recent Labs  Lab 03/13/2018 2000  NA 137  K 4.6  CL 105  CO2 15*  BUN 29*  CREATININE 1.60*  GLUCOSE 231*    Electrolytes Recent Labs  Lab 02/25/2018 2000  CALCIUM 8.0*    CBC Recent Labs  Lab 03/03/2018 2000  WBC 6.3  HGB 8.7*  HCT 29.9*  PLT 294    Coag's No results for input(s): APTT, INR in the last 168 hours.  Sepsis Markers Recent Labs  Lab 03/13/2018 2017  LATICACIDVEN 8.61*    ABG No results for input(s): PHART, PCO2ART, PO2ART in the last 168 hours.  Liver Enzymes Recent Labs  Lab 03/18/2018 2000  AST 42*  ALT 20  ALKPHOS 72  BILITOT 1.3*  ALBUMIN 2.5*    Cardiac Enzymes No  results for input(s): TROPONINI, PROBNP in the last 168 hours.  Glucose No results for input(s): GLUCAP in the last 168 hours.  Imaging Dg Chest Port 1 View  Result Date: 03/13/2018 CLINICAL DATA:  Dyspnea.  Chest pain. EXAM: PORTABLE CHEST 1 VIEW COMPARISON:  Radiographs 01/29/2018 FINDINGS: Mild cardiomegaly which is similar to prior exam. There is aortic atherosclerosis. Unchanged minimal vascular congestion. Small left greater than right pleural effusions, new. No confluent consolidation. No pneumothorax. No acute osseous abnormalities are seen. Surgical clips in the left axilla. IMPRESSION: Small bilateral pleural effusions, new from prior exam. Stable cardiomegaly and vascular congestion. Electronically Signed   By: Jeb Levering M.D.   On: 03/07/2018  21:00     STUDIES:  PFT 09/05/2017 > 09/05/17: FVC 1.20 L (59%) FEV1 0.82 L (52%) FEV1/FVC 0.68 FEF 25-75 0.41 L (29%) negative bronchodilator response TLC 3.50 L (73%) RV 102% DLCO corrected 50% CXR 3/2 >Mild cardiomegaly which is similar to prior exam. There is aortic atherosclerosis. Unchanged minimal vascular congestion. Small left greater than right pleural effusions, new. No confluent consolidation. No pneumothorax. No acute osseous abnormalities are seen. Surgical clips in the left axilla.  Left/Right Heart Cath 3/2 >  Left Main  Vessel is normal in caliber. Vessel is angiographically normal.  Left Anterior Descending  Vessel is small.  Mid LAD lesion 40% stenosed  Mid LAD lesion is 40% stenosed.  Left Circumflex  Vessel is normal in caliber.  Mid Cx lesion 10% stenosed  Mid Cx lesion is 10% stenosed.  Right Coronary Artery  Vessel is very large.   CULTURES: Blood 3/2 >> Sputum 3/2 >> RVP 3/2 >> U/A 3/2 >>  ANTIBIOTICS: None   SIGNIFICANT EVENTS: 3/2 > Presents to ED   LINES/TUBES: PIV Right Arterial Femoral Sheath 3/2 >>>  Right Venous Femoral Sheath 3/2 >>  DISCUSSION: 77 year old female presents with hypotension, weakness, and tachycardia. LA 8.7. EKG with ST elevation in AVR and V1. Taken to [facility]. Right/Left Heart Cath with nonobstructive CAD, severe pulmonary HTN with intact RV systolic function. Sent to ICU for further work-up post-cath.   ASSESSMENT / PLAN:  PULMONARY A: Acute on Chronic Hypoxic Respiratory Failure in setting of decompensated HF vs Progressive Pulmonary HTN vs viral/bacterial infectious process  Chronic Bronchitis Severe Pulmonary HTN  H/O OSA on CPAP at HS, Asthma   P:   Maintain Oxygen Saturation >90 (on 3L home)  CPAP at HS  RVP pending  Brovana/Pulimort, PRN albuterol  ECHO/LE Dopplers pending (given painful LE edema and dyspnea, patient take pradaxa and states she has not missed a dose)   CARDIOVASCULAR A:  Chest Pain  with ST elevation in aVR and V1 s/p Cath  Right/Left Heart Cath nonobstructive CAD, severe PHTN, with intact RV systolic function  Cardiac output 5.9 L/m, cardiac index 3.1 L/m/m, PA Sats 64%, PAPi 2.8 indicating preserved RV systolic function Diastolic HF  Chronic cor pulmonale  A.Fib, HLD, HTN P:  Cardiac Monitoring Cardiology Following  Maintain MAP >65 Trend Troponin  Continue Lipitor and ASA  Hold metoprolol until improvement of BP  Pradaxa on hold until Sheath pulled   RENAL A:   Acute Kidney Injury  Crt Baseline 0.9-1.24 Anion Gap Metabolic Acidosis with Lactic Acidosis in setting of hypoperfusion vs sepsis  LA 8.7 >>  P:   Trend BMP Replace electrolytes as indicated  Trend LA  Given 1L NS, Continues on NS @ 75 ml/hr until 3am per post-cath protocol   GASTROINTESTINAL A:  H/O GERD  P:   Clear Liquid  Pepcid   HEMATOLOGIC A:   Anemia of Chronic Disease   H/O Breast CA s/p chemo/radiation and lymph node removal in 2005  P:  Trend CBC  Maintain Hbg >7   INFECTIOUS A:   Lactic Acidosis  Afebrile, no WBC, low suspension of infectious process  P:   Trend WBC and Fever Curve  Trend LA and PCT  PAN Culture  Monitor off antibiotics  RVP Pending   ENDOCRINE A:   DM   P:   Trend Glucose  SSI   NEUROLOGIC A:   No issues  P:   Monitor    FAMILY  - Updates: Family updated on plan of care.   - Inter-disciplinary family meet or Palliative Care meeting due by: 03/22/2018    CC Time: 77 minutes  Hayden Pedro, AGACNP-BC [facility]  Pgr: [phone]  PCCM Pgr: [phone]

## 2018-02-23 NOTE — ED Triage Notes (Addendum)
EMS reports pt started w/ 9/10 chest pain approx 1 hour PTA. Pt endorses severe dizziness, BP low as 72 systolic for EMS, received 500cc NS and rebounded to 154 systolic on arrival. Pt w/ hx of Afib, rate approx 90. Recently started on dig per EMS. EMS reports pt + orthostatic on scene. Received 324 ASA, no nitro d/t hypotension

## 2018-02-23 NOTE — H&P (Signed)
77 y/o AAF w/STMEI> Known severe COPD, pulmonary hypertension.

## 2018-02-24 ENCOUNTER — Inpatient Hospital Stay (HOSPITAL_COMMUNITY): Payer: Medicare Other

## 2018-02-24 ENCOUNTER — Other Ambulatory Visit: Payer: Self-pay

## 2018-02-24 ENCOUNTER — Encounter (HOSPITAL_COMMUNITY): Payer: Medicare Other

## 2018-02-24 DIAGNOSIS — N183 Chronic kidney disease, stage 3 (moderate): Secondary | ICD-10-CM

## 2018-02-24 DIAGNOSIS — Z9989 Dependence on other enabling machines and devices: Secondary | ICD-10-CM

## 2018-02-24 DIAGNOSIS — I2721 Secondary pulmonary arterial hypertension: Secondary | ICD-10-CM

## 2018-02-24 DIAGNOSIS — J9611 Chronic respiratory failure with hypoxia: Secondary | ICD-10-CM

## 2018-02-24 DIAGNOSIS — J45909 Unspecified asthma, uncomplicated: Secondary | ICD-10-CM

## 2018-02-24 DIAGNOSIS — E1122 Type 2 diabetes mellitus with diabetic chronic kidney disease: Secondary | ICD-10-CM

## 2018-02-24 DIAGNOSIS — R609 Edema, unspecified: Secondary | ICD-10-CM

## 2018-02-24 DIAGNOSIS — I482 Chronic atrial fibrillation: Secondary | ICD-10-CM

## 2018-02-24 DIAGNOSIS — G4733 Obstructive sleep apnea (adult) (pediatric): Secondary | ICD-10-CM

## 2018-02-24 DIAGNOSIS — E872 Acidosis: Principal | ICD-10-CM

## 2018-02-24 DIAGNOSIS — R570 Cardiogenic shock: Secondary | ICD-10-CM

## 2018-02-24 DIAGNOSIS — R571 Hypovolemic shock: Secondary | ICD-10-CM | POA: Diagnosis present

## 2018-02-24 LAB — URINALYSIS, ROUTINE W REFLEX MICROSCOPIC
BILIRUBIN URINE: NEGATIVE
Glucose, UA: NEGATIVE mg/dL
Ketones, ur: NEGATIVE mg/dL
Leukocytes, UA: NEGATIVE
NITRITE: NEGATIVE
PH: 5 (ref 5.0–8.0)
Protein, ur: 30 mg/dL — AB
SPECIFIC GRAVITY, URINE: 1.016 (ref 1.005–1.030)

## 2018-02-24 LAB — RESPIRATORY PANEL BY PCR
ADENOVIRUS-RVPPCR: NOT DETECTED
Bordetella pertussis: NOT DETECTED
CHLAMYDOPHILA PNEUMONIAE-RVPPCR: NOT DETECTED
CORONAVIRUS 229E-RVPPCR: NOT DETECTED
CORONAVIRUS HKU1-RVPPCR: NOT DETECTED
CORONAVIRUS NL63-RVPPCR: NOT DETECTED
Coronavirus OC43: NOT DETECTED
Influenza A: NOT DETECTED
Influenza B: NOT DETECTED
MYCOPLASMA PNEUMONIAE-RVPPCR: NOT DETECTED
Metapneumovirus: NOT DETECTED
Parainfluenza Virus 1: NOT DETECTED
Parainfluenza Virus 2: NOT DETECTED
Parainfluenza Virus 3: NOT DETECTED
Parainfluenza Virus 4: NOT DETECTED
Respiratory Syncytial Virus: NOT DETECTED
Rhinovirus / Enterovirus: NOT DETECTED

## 2018-02-24 LAB — BASIC METABOLIC PANEL
ANION GAP: 16 — AB (ref 5–15)
Anion gap: 9 (ref 5–15)
BUN: 32 mg/dL — ABNORMAL HIGH (ref 6–20)
BUN: 32 mg/dL — ABNORMAL HIGH (ref 6–20)
CALCIUM: 7.6 mg/dL — AB (ref 8.9–10.3)
CALCIUM: 7.9 mg/dL — AB (ref 8.9–10.3)
CHLORIDE: 104 mmol/L (ref 101–111)
CO2: 20 mmol/L — ABNORMAL LOW (ref 22–32)
CO2: 16 mmol/L — AB (ref 22–32)
Chloride: 109 mmol/L (ref 101–111)
Creatinine, Ser: 1.74 mg/dL — ABNORMAL HIGH (ref 0.44–1.00)
Creatinine, Ser: 1.76 mg/dL — ABNORMAL HIGH (ref 0.44–1.00)
GFR calc non Af Amer: 27 mL/min — ABNORMAL LOW (ref >60)
GFR, EST AFRICAN AMERICAN: 32 mL/min — AB (ref >60)
GFR, EST AFRICAN AMERICAN: 32 mL/min — AB (ref >60)
GFR, EST NON AFRICAN AMERICAN: 28 mL/min — AB (ref >60)
GLUCOSE: 196 mg/dL — AB (ref 65–99)
Glucose, Bld: 115 mg/dL — ABNORMAL HIGH (ref 65–99)
POTASSIUM: 4.3 mmol/L (ref 3.5–5.1)
Potassium: 4.6 mmol/L (ref 3.5–5.1)
SODIUM: 138 mmol/L (ref 135–145)
Sodium: 136 mmol/L (ref 135–145)

## 2018-02-24 LAB — POCT ACTIVATED CLOTTING TIME: Activated Clotting Time: >1000 seconds

## 2018-02-24 LAB — CBC
HEMATOCRIT: 24.2 % — AB (ref 36.0–46.0)
HEMATOCRIT: 27.6 % — AB (ref 36.0–46.0)
HEMOGLOBIN: 7.4 g/dL — AB (ref 12.0–15.0)
HEMOGLOBIN: 8 g/dL — AB (ref 12.0–15.0)
MCH: 23.9 pg — ABNORMAL LOW (ref 26.0–34.0)
MCH: 24.9 pg — AB (ref 26.0–34.0)
MCHC: 29 g/dL — ABNORMAL LOW (ref 30.0–36.0)
MCHC: 30.6 g/dL (ref 30.0–36.0)
MCV: 81.5 fL (ref 78.0–100.0)
MCV: 82.4 fL (ref 78.0–100.0)
Platelets: 252 10*3/uL (ref 150–400)
Platelets: 300 10*3/uL (ref 150–400)
RBC: 2.97 MIL/uL — ABNORMAL LOW (ref 3.87–5.11)
RBC: 3.35 MIL/uL — AB (ref 3.87–5.11)
RDW: 18.6 % — ABNORMAL HIGH (ref 11.5–15.5)
RDW: 18.7 % — AB (ref 11.5–15.5)
WBC: 11.7 10*3/uL — ABNORMAL HIGH (ref 4.0–10.5)
WBC: 6 10*3/uL (ref 4.0–10.5)

## 2018-02-24 LAB — HEPATIC FUNCTION PANEL
ALT: 92 U/L — ABNORMAL HIGH (ref 14–54)
AST: 187 U/L — ABNORMAL HIGH (ref 15–41)
Albumin: 2.5 g/dL — ABNORMAL LOW (ref 3.5–5.0)
Alkaline Phosphatase: 85 U/L (ref 38–126)
BILIRUBIN INDIRECT: 0.6 mg/dL (ref 0.3–0.9)
BILIRUBIN TOTAL: 0.8 mg/dL (ref 0.3–1.2)
Bilirubin, Direct: 0.2 mg/dL (ref 0.1–0.5)
Total Protein: 6 g/dL — ABNORMAL LOW (ref 6.5–8.1)

## 2018-02-24 LAB — POCT I-STAT, CHEM 8
BUN: 29 mg/dL — ABNORMAL HIGH (ref 6–20)
CALCIUM ION: 1.07 mmol/L — AB (ref 1.15–1.40)
Chloride: 106 mmol/L (ref 101–111)
Creatinine, Ser: 1.8 mg/dL — ABNORMAL HIGH (ref 0.44–1.00)
Glucose, Bld: 89 mg/dL (ref 65–99)
HCT: 24 % — ABNORMAL LOW (ref 36.0–46.0)
HEMOGLOBIN: 8.2 g/dL — AB (ref 12.0–15.0)
Potassium: 4.4 mmol/L (ref 3.5–5.1)
SODIUM: 141 mmol/L (ref 135–145)
TCO2: 23 mmol/L (ref 22–32)

## 2018-02-24 LAB — PROCALCITONIN
Procalcitonin: <0.1 ng/mL
Procalcitonin: 0.14 ng/mL

## 2018-02-24 LAB — CORTISOL: CORTISOL PLASMA: 10.6 ug/dL

## 2018-02-24 LAB — MRSA PCR SCREENING: MRSA BY PCR: NEGATIVE

## 2018-02-24 LAB — PROTIME-INR: INR: >10

## 2018-02-24 LAB — LIPID PANEL
CHOL/HDL RATIO: 4.3 ratio
Cholesterol: 91 mg/dL (ref 0–200)
HDL: 21 mg/dL — AB (ref >40)
LDL Cholesterol: 57 mg/dL (ref 0–99)
TRIGLYCERIDES: 65 mg/dL (ref <150)
VLDL: 13 mg/dL (ref 0–40)

## 2018-02-24 LAB — TROPONIN I: Troponin I: 0.19 ng/mL (ref <0.03)

## 2018-02-24 LAB — GLUCOSE, CAPILLARY: Glucose-Capillary: 158 mg/dL — ABNORMAL HIGH (ref 65–99)

## 2018-02-24 LAB — HEPARIN LEVEL (UNFRACTIONATED): Heparin Unfractionated: <0.1 IU/mL — ABNORMAL LOW (ref 0.30–0.70)

## 2018-02-24 LAB — EXPECTORATED SPUTUM ASSESSMENT W REFEX TO RESP CULTURE

## 2018-02-24 LAB — FIBRINOGEN: FIBRINOGEN: 267 mg/dL (ref 210–475)

## 2018-02-24 LAB — ABO/RH: ABO/RH(D): O POS

## 2018-02-24 LAB — LACTIC ACID, PLASMA
LACTIC ACID, VENOUS: 2.3 mmol/L — AB (ref 0.5–1.9)
LACTIC ACID, VENOUS: 4.3 mmol/L — AB (ref 0.5–1.9)

## 2018-02-24 LAB — APTT: APTT: 109 s — AB (ref 24–36)

## 2018-02-24 LAB — DIGOXIN LEVEL: Digoxin Level: 0.4 ng/mL — ABNORMAL LOW (ref 0.8–2.0)

## 2018-02-24 MED ORDER — SODIUM CHLORIDE 0.9 % IV SOLN
0.0000 ug/min | INTRAVENOUS | Status: DC
  Administered 2018-02-24: 125 ug/min via INTRAVENOUS
  Filled 2018-02-24: qty 4
  Filled 2018-02-24: qty 40

## 2018-02-24 MED ORDER — HALOPERIDOL LACTATE 5 MG/ML IJ SOLN: 0.5000 mg | INTRAMUSCULAR | Status: DC | PRN

## 2018-02-24 MED ORDER — MORPHINE BOLUS VIA INFUSION
1.0000 mg | INTRAVENOUS | Status: DC | PRN
  Administered 2018-02-24 (×4): 1 mg via INTRAVENOUS
  Filled 2018-02-24: qty 1

## 2018-02-24 MED ORDER — GUAIFENESIN-DM 100-10 MG/5ML PO SYRP
5.0000 mL | ORAL_SOLUTION | ORAL | Status: DC | PRN
  Administered 2018-02-24: 5 mL via ORAL
  Filled 2018-02-24: qty 5

## 2018-02-24 MED ORDER — SODIUM CHLORIDE 0.9 % IV SOLN
Freq: Once | INTRAVENOUS | Status: AC
  Administered 2018-02-24: 09:00:00 via INTRAVENOUS

## 2018-02-24 MED ORDER — STERILE WATER FOR INJECTION IV SOLN
INTRAVENOUS | Status: DC
  Administered 2018-02-24: 03:00:00 via INTRAVENOUS
  Filled 2018-02-24 (×4): qty 850

## 2018-02-24 MED ORDER — HALOPERIDOL LACTATE 2 MG/ML PO CONC
0.5000 mg | ORAL | Status: DC | PRN
  Filled 2018-02-24: qty 0.3

## 2018-02-24 MED ORDER — SODIUM CHLORIDE 0.9 % IV SOLN
1.0000 mg/h | INTRAVENOUS | Status: DC
  Administered 2018-02-24: 1 mg/h via INTRAVENOUS
  Filled 2018-02-24: qty 10

## 2018-02-24 MED ORDER — MORPHINE SULFATE (PF) 4 MG/ML IV SOLN
2.0000 mg | INTRAVENOUS | Status: DC | PRN
Start: 2018-02-24 — End: 2018-02-24
  Administered 2018-02-24: 2 mg via INTRAVENOUS
  Filled 2018-02-24: qty 1

## 2018-02-24 MED ORDER — ASPIRIN EC 81 MG PO TBEC: 81.0000 mg | DELAYED_RELEASE_TABLET | Freq: Every day | ORAL | Status: DC

## 2018-02-24 MED ORDER — HALOPERIDOL 0.5 MG PO TABS: 0.5000 mg | ORAL_TABLET | ORAL | Status: DC | PRN

## 2018-02-24 MED ORDER — ATORVASTATIN CALCIUM 20 MG PO TABS: 20.0000 mg | ORAL_TABLET | Freq: Every day | ORAL | Status: DC

## 2018-02-24 MED ORDER — BIOTENE DRY MOUTH MT LIQD: 15.0000 mL | OROMUCOSAL | Status: DC | PRN

## 2018-02-24 MED ORDER — ONDANSETRON 4 MG PO TBDP: 4.0000 mg | ORAL_TABLET | Freq: Four times a day (QID) | ORAL | Status: DC | PRN

## 2018-02-24 MED ORDER — POLYVINYL ALCOHOL 1.4 % OP SOLN: 1.0000 [drp] | Freq: Four times a day (QID) | OPHTHALMIC | Status: DC | PRN

## 2018-02-24 MED ORDER — ORAL CARE MOUTH RINSE
15.0000 mL | Freq: Two times a day (BID) | OROMUCOSAL | Status: DC
  Administered 2018-02-24: 15 mL via OROMUCOSAL

## 2018-02-24 MED ORDER — GLYCOPYRROLATE 1 MG PO TABS
1.0000 mg | ORAL_TABLET | ORAL | Status: DC | PRN
Start: 2018-02-24 — End: 2018-02-24
  Filled 2018-02-24: qty 1

## 2018-02-24 MED ORDER — SODIUM CHLORIDE 0.9 % IV BOLUS (SEPSIS)
1000.0000 mL | Freq: Once | INTRAVENOUS | Status: AC
  Administered 2018-02-24: 1000 mL via INTRAVENOUS

## 2018-02-24 MED ORDER — ACETAMINOPHEN 650 MG RE SUPP: 650.0000 mg | Freq: Four times a day (QID) | RECTAL | Status: DC | PRN

## 2018-02-24 MED ORDER — GLYCOPYRROLATE 0.2 MG/ML IJ SOLN: 0.2000 mg | INTRAMUSCULAR | Status: DC | PRN

## 2018-02-24 MED ORDER — SODIUM CHLORIDE 0.9 % IV SOLN: 250.0000 mL | INTRAVENOUS | Status: DC | PRN

## 2018-02-24 MED ORDER — ACETAMINOPHEN 325 MG PO TABS: 650.0000 mg | ORAL_TABLET | Freq: Four times a day (QID) | ORAL | Status: DC | PRN

## 2018-02-24 MED ORDER — SODIUM CHLORIDE 0.9 % IV BOLUS (SEPSIS)
500.0000 mL | Freq: Once | INTRAVENOUS | Status: DC
Start: 2018-02-24 — End: 2018-02-24

## 2018-02-24 MED ORDER — ONDANSETRON HCL 4 MG/2ML IJ SOLN: 4.0000 mg | Freq: Four times a day (QID) | INTRAMUSCULAR | Status: DC | PRN

## 2018-02-24 MED ORDER — SODIUM CHLORIDE 0.9 % IV SOLN
0.0000 ug/min | INTRAVENOUS | Status: DC
  Administered 2018-02-24: 50 ug/min via INTRAVENOUS
  Filled 2018-02-24: qty 1

## 2018-02-24 MED ORDER — FUROSEMIDE 10 MG/ML IJ SOLN
40.0000 mg | Freq: Three times a day (TID) | INTRAMUSCULAR | Status: DC
  Administered 2018-02-24: 40 mg via INTRAVENOUS
  Filled 2018-02-24: qty 4

## 2018-02-24 MED ORDER — SODIUM CHLORIDE 0.9 % IV SOLN: Freq: Once | INTRAVENOUS | Status: DC

## 2018-02-25 ENCOUNTER — Encounter (HOSPITAL_COMMUNITY): Payer: Self-pay | Admitting: Cardiology

## 2018-02-25 LAB — POCT I-STAT 3, VENOUS BLOOD GAS (G3P V)
ACID-BASE DEFICIT: 8 mmol/L — AB (ref 0.0–2.0)
BICARBONATE: 18.9 mmol/L — AB (ref 20.0–28.0)
O2 SAT: 64 %
TCO2: 20 mmol/L — ABNORMAL LOW (ref 22–32)
pCO2, Ven: 43.7 mmHg — ABNORMAL LOW (ref 44.0–60.0)
pH, Ven: 7.244 — ABNORMAL LOW (ref 7.250–7.430)
pO2, Ven: 39 mmHg (ref 32.0–45.0)

## 2018-02-25 LAB — TYPE AND SCREEN
ABO/RH(D): O POS
Antibody Screen: NEGATIVE
UNIT DIVISION: 0
UNIT DIVISION: 0
UNIT DIVISION: 0

## 2018-02-25 LAB — GLUCOSE, CAPILLARY: GLUCOSE-CAPILLARY: 97 mg/dL (ref 65–99)

## 2018-02-25 LAB — PREPARE FRESH FROZEN PLASMA
UNIT DIVISION: 0
Unit division: 0
Unit division: 0
Unit division: 0

## 2018-02-25 LAB — POCT ACTIVATED CLOTTING TIME

## 2018-02-25 LAB — BPAM RBC
BLOOD PRODUCT EXPIRATION DATE: 201903272359
Blood Product Expiration Date: 201903272359
Blood Product Expiration Date: 201903272359
UNIT TYPE AND RH: 5100
Unit Type and Rh: 5100
Unit Type and Rh: 5100

## 2018-02-25 LAB — POCT I-STAT 3, ART BLOOD GAS (G3+)
ACID-BASE DEFICIT: 7 mmol/L — AB (ref 0.0–2.0)
Acid-base deficit: 6 mmol/L — ABNORMAL HIGH (ref 0.0–2.0)
BICARBONATE: 19 mmol/L — AB (ref 20.0–28.0)
Bicarbonate: 19.2 mmol/L — ABNORMAL LOW (ref 20.0–28.0)
O2 SAT: 100 %
O2 SAT: 100 %
PH ART: 7.325 — AB (ref 7.350–7.450)
TCO2: 20 mmol/L — ABNORMAL LOW (ref 22–32)
TCO2: 20 mmol/L — AB (ref 22–32)
pCO2 arterial: 36.4 mmHg (ref 32.0–48.0)
pCO2 arterial: 40.2 mmHg (ref 32.0–48.0)
pH, Arterial: 7.288 — ABNORMAL LOW (ref 7.350–7.450)
pO2, Arterial: 439 mmHg — ABNORMAL HIGH (ref 83.0–108.0)
pO2, Arterial: 540 mmHg — ABNORMAL HIGH (ref 83.0–108.0)

## 2018-02-25 LAB — BPAM FFP
BLOOD PRODUCT EXPIRATION DATE: 201903082359
Blood Product Expiration Date: 201903032359
Blood Product Expiration Date: 201903032359
Blood Product Expiration Date: 201903082359
ISSUE DATE / TIME: 201903030958
ISSUE DATE / TIME: 201903030753
UNIT TYPE AND RH: 6200
UNIT TYPE AND RH: 5100
Unit Type and Rh: 600
Unit Type and Rh: 5100

## 2018-02-25 MED FILL — Heparin Sodium (Porcine) 2 Unit/ML in Sodium Chloride 0.9%: INTRAMUSCULAR | Qty: 1000 | Status: AC

## 2018-02-25 MED FILL — Nitroglycerin IV Soln 100 MCG/ML in D5W: INTRA_ARTERIAL | Qty: 10 | Status: AC

## 2018-02-25 MED FILL — Verapamil HCl IV Soln 2.5 MG/ML: INTRAVENOUS | Qty: 2 | Status: AC

## 2018-02-28 LAB — CULTURE, BLOOD (ROUTINE X 2): Culture: NO GROWTH

## 2018-03-01 LAB — CULTURE, BLOOD (ROUTINE X 2)
Culture: NO GROWTH
SPECIAL REQUESTS: ADEQUATE

## 2018-03-25 ENCOUNTER — Ambulatory Visit: Payer: Medicare Other | Admitting: Emergency Medicine

## 2018-03-25 NOTE — Progress Notes (Addendum)
Subjective:  Continues to have difficulty breathing No chest pain No abdominal pain Says, "I'm tired, I've suffered a lot. I just want to rest, I want peace".  Remains hypotensive On phenylephrine drip INR >10 Anemia Hb 8.2 post 1 U PRBC Awaiting FFP Cr stable Lactic acid improving Procalcitonin negative Liver enzymes mildly elevated.  Digoxin level subtherapeutic. Recently started on digoxin 125 mcg daily last week for Afib rate control  Objective:  Vital Signs in the last 24 hours: Temp:  [97.3 F (36.3 C)-98.5 F (36.9 C)] 97.5 F (36.4 C) (03/03 1000) Pulse Rate:  [0-252] 100 (03/03 1000) Resp:  [0-35] 25 (03/03 1000) BP: (54-185)/(21-138) 68/47 (03/03 1000) SpO2:  [0 %-100 %] 100 % (03/03 1000) Arterial Line BP: (72-106)/(39-61) 81/44 (03/03 1000) FiO2 (%):  [40 %-100 %] 40 % (03/03 1000) Weight:  [87.5 kg (193 lb)] 87.5 kg (193 lb) (03/02 1956)  Intake/Output from previous day: 03/02 0701 - 03/03 0700 In: 992.5 [P.O.:240; I.V.:692.5] Out: 80 [Urine:80] Intake/Output from this shift: Total I/O In: 618 [I.V.:400; Blood:218] Out: 30 [Urine:30]  Physical Exam: Nursing note and vitals reviewed. Constitutional: She is oriented to person, place, and time. She appears well-developed and well-nourished. She appears distressed.  HENT:  Head: Normocephalic and atraumatic.  Eyes: Conjunctivae are normal. Pupils are equal, round, and reactive to light.  Neck: Normal range of motion. Neck supple. JVD present.  Cardiovascular:  Murmur (II/VI holosystolic RLSB) heard. Irregularly irregular, tachycardic No carotid bruit Intact peripheral pulses  Right femoral artery and vein sheath in place. No hematoma Musculoskeletal: She exhibits edema (1+ b/l).  Lymphadenopathy:    She has no cervical adenopathy.  Neurological: She is alert and oriented to person, place, and time. No cranial nerve deficit is present.  Skin: Skin is warm and dry.  Psychiatric: She has a normal mood  and affect.      Lab Results: Recent Labs    03/11/2018 2000 2018/02/26 0742 2018-02-26 0756  WBC 6.3 6.0  --   HGB 8.7* 7.4* 8.2*  PLT 294 252  --    Recent Labs    02/25/2018 2000 26-Feb-2018 0428 02/26/18 0756  NA 137 138 141  K 4.6 4.3 4.4  CL 105 109 106  CO2 15* 20*  --   GLUCOSE 231* 115* 89  BUN 29* 32* 29*  CREATININE 1.60* 1.74* 1.80*   Recent Labs    03/01/2018 0005  TROPONINI 0.19*   Hepatic Function Panel Recent Labs    02-26-18 0742  PROT 6.0*  ALBUMIN 2.5*  AST 187*  ALT 92*  ALKPHOS 85  BILITOT 0.8  BILIDIR 0.2  IBILI 0.6   Recent Labs    02/23/2018 0005  CHOL 91    Imaging: EXAM: PORTABLE CHEST 1 VIEW  COMPARISON:  Radiographs 01/29/2018  FINDINGS: Mild cardiomegaly which is similar to prior exam. There is aortic atherosclerosis. Unchanged minimal vascular congestion. Small left greater than right pleural effusions, new. No confluent consolidation. No pneumothorax. No acute osseous abnormalities are seen. Surgical clips in the left axilla.  IMPRESSION: Small bilateral pleural effusions, new from prior exam. Stable cardiomegaly and vascular congestion.   Cardiac Studies: 02/23/2018: Mild nonobstructive disease Normal LVEDP Severe pulmonary hypertension No acute coronary syndrome or cardiogenic shock   Assessment: 77 year old African-American female with hypertension, hyperlipidemia, type II diabetes mellitus,persistent Afib (CHA2DS2VASc score 4), on pradaxa,moderate obesity, severe persistent asthma on home oxygen, WHO group II/III pulmonary hypertension FC II-III, h/o breast cancer. Admitted with chest pain and shortness of  breath.  Severe circulatory shock: Etiology remains unclear. Hemorrhagic vs hypovolumic. No objective signs of sepsis. No cardiogenic shock, as evidenced by right heart cath. Obstructive shock? PE? Cardiac tamponade. Not a candidate for any thrombolysis or pericardiocentesis, even if this were to be true,  given her severe coagulopathy. Severe coagulopathy: Not DIC, but profoundly elevated INR, as well as ACT without significant lever dysfunction Oliguria/anuria: Pre renal in the setting of shock Chest pain: Now resolved. EKG did show mild ST elevation in aVR and V1, but coronary angiogram shows mild nonobstructive CAD Shortness of breath: Multifactorial, combination of underlying pulmonary hypertension, Small pleural effusions Metabolic acidosis Afib [id number] score 4: On Pradaxa. On digoxin for rate control, at baseline Pulmonary hypertension: WHO Grp II/III Severe persistent asthma, on home O2 Type 56M H/o breast cancer H/o hypertension hyperlipidemia  Recommendations: While etiology for her acute decompensation remains unclear, patient has unequivocally expressed her wishes to not to be resuscitated, both in the outpatient setting prior to this episode. She reiterated the same this morning in presence of her son, grandson Des, and her nurse, Donaciano Eva.   I have discussed this with CCM attending Dr. Pia Mau. We are in agreement that given her poor prognosis and no clear reversible cause identified, comfort care and palliative care measures should be initiated, keeping in line with the patient's wishes.   Will await the arrival of her family, especially daughters Ivin Booty and Dewaine Oats, to initiate full comfort care measures. No escalation of medical therapy.     LOS: 1 day    Lisa Walters J Stana Bayon 03-10-2018, 10:17 AM  [place], MD [facility] Pager: [phone] Office: [phone] If no answer Cell [phone]

## 2018-03-25 NOTE — Progress Notes (Signed)
20cc Morphine wasted in sink from IV bag with 2nd RN, Lucius Conn, witnessing.

## 2018-03-25 NOTE — Progress Notes (Signed)
Awaiting phone call from MD

## 2018-03-25 NOTE — Progress Notes (Signed)
VASCULAR LAB PRELIMINARY  PRELIMINARY  PRELIMINARY  PRELIMINARY  Bilateral lower extremity venous duplex completed.    Preliminary report:  There is no DVT or SVT noted in the bilateral lower extremities.  There is significant interstitial fluid noted throughout the bilateral lower extremities and pulsatile Doppler waveforms are suggestive of fluid overload.   Brailyn Delman, RVT 2018/03/13, 9:32 AM

## 2018-03-25 NOTE — Progress Notes (Signed)
Patient ready to pass away but family is not ready for her to pass away and very upset.  Visited, sang some songs-patient sang along despite breathing issues.  Patient at peace but family upset.Pastoral presence and prayer until paged to another call. Conard Novak, Chaplain   Mar 23, 2018 1300  Clinical Encounter Type  Visited With Patient and family together  Visit Type Spiritual support;Critical Care  Referral From Nurse  Consult/Referral To Chaplain  Spiritual Encounters  Spiritual Needs Prayer;Emotional  Stress Factors  Patient Stress Factors None identified  Family Stress Factors Health changes (patient expected to decline family upset)

## 2018-03-25 NOTE — Progress Notes (Signed)
Received call from lab, INR>10. MD on call made aware. Heparin level ordered placed, per MD. Awaiting results

## 2018-03-25 NOTE — Progress Notes (Addendum)
Attempted to call MD on call,regarding pt's low output and low pressures. Received orders to give a 516ml bolus. Assessed pt's respiratory status, pt had new onset of coarse crackles in bases. Pt urine output at this time 121mls after receiving a total of 1.5L of fluids.  Pt A&OX4, no complaints of pain, and does not appear to be in distress. Pt tolerating CPAP well. Assessed right groin site, no bleeding/hematoma noted. Called MD back to address these concerns before starting another bolus.

## 2018-03-25 NOTE — Progress Notes (Signed)
RT note- Nebulizer given, doppler being performed and patient c/o shortness of breath, will keep on cpap until dopplers are complete then re assess.

## 2018-03-25 NOTE — Plan of Care (Signed)
Per MD orders for comfort care, Morphine gtt started at 1mg /hr. Will wean slowly off Neosynephrine gtt, monitor for signs of pain and discomfort, and adjust the Morphine gtt as needed.

## 2018-03-25 NOTE — Death Summary Note (Addendum)
Changes noted on pt's monitor. Pt unresponsive with family at bedside. Pt's chest auscultated for 3 minutes with San Jetty, RN. No heart tones or lung sounds present. No radial pulses palpated. Time of death 79. Attending physician, Dr. Virgina Jock, called and made aware. [place] called and made aware of patient's passing.   Patient's next of kin, daughter Lenda Kelp, to contact us when a decision has been made regarding funeral home arrangements.  Pt and family wish for ring on pt's left ring ringer to stay on pt and be buried with her.

## 2018-03-25 NOTE — Progress Notes (Signed)
Cancelled echo per nurse (withdrawing care)

## 2018-03-25 NOTE — Progress Notes (Signed)
PULMONARY / CRITICAL CARE MEDICINE   Name: Lisa Walters MRN: [id number] DOB: 11/04/1941    ADMISSION DATE:  03/19/2018 CONSULTATION DATE:  03/23/2018  REFERRING MD:  Dr. Virgina Jock   CHIEF COMPLAINT:  Hypotension   HISTORY OF PRESENT ILLNESS:   77 year old female with PMH of HTN, HLD, DM, A.Fib on Pradaxa, Diastolic HF, Severe PHTN, Chronic Hypoxic on 3L [place], OSA on CPAP at HS (states she has not been using this recently), Breast Cancer 2005 treated with chemo/radiation and lymph node removal  Presents to ED on 3/2 with with hypotension, weakness, and tachycardia. LA 8.7. EKG with ST elevation in AVR and V1. Taken to [facility]. Right/Left Heart Cath with nonobstructive CAD, severe pulmonary HTN with intact RV systolic function. Sent to ICU for further work-up post-cath.   Patient states that she has had productive cough with brown sputum production and ongoing dyspnea for the last month as well as noted swelling in BLE in which at times is painful. Denies missing dose of Pradaxa. Recent admission 1/7-1/11 with dyspnea and lower extremity edema. Treated for CHF exacerbation.   SUBJECTIVE: Hypotensive overnight with neo  VITAL SIGNS: BP (!) 66/55   Pulse 86   Temp 97.6 F (36.4 C) (Axillary)   Resp 20   Ht 5\' 3"  (1.6 m)   Wt 193 lb (87.5 kg)   SpO2 98%   BMI 34.19 kg/m   HEMODYNAMICS:    VENTILATOR SETTINGS: FiO2 (%):  [50 %-100 %] 60 %  INTAKE / OUTPUT: I/O last 3 completed shifts: In: 992.5 [P.O.:240; I.V.:692.5; Other:60] Out: 51 [Urine:80]  PHYSICAL EXAMINATION: General:  Elderly female, no distress  Neuro:  Alert, oriented, follows commands  HEENT:  [place]/AT, PERRL, EOM-I and MMM Cardiovascular:  IRIR, Nl S1/S2 and -M/R/G Lungs:  Diminished to bases, no wheeze/crackles  Abdomen:  Non-distended, active bowel sounds  Musculoskeletal:  +3 BLE  Skin:  Warm, dry   LABS:  BMET Recent Labs  Lab 02/27/2018 0005 03/08/2018 2000 03/01/2018 0428 Mar 02, 2018 0756  NA 136 137  138 141  K 4.6 4.6 4.3 4.4  CL 104 105 109 106  CO2 16* 15* 20*  --   BUN 32* 29* 32* 29*  CREATININE 1.76* 1.60* 1.74* 1.80*  GLUCOSE 196* 231* 115* 89   Electrolytes Recent Labs  Lab 03/24/2018 0005 03/06/2018 2000 03/18/2018 0428  CALCIUM 7.9* 8.0* 7.6*   CBC Recent Labs  Lab 03/22/2018 0005 02/26/2018 2000 March 02, 2018 0742 03/13/2018 0756  WBC 11.7* 6.3 6.0  --   HGB 8.0* 8.7* 7.4* 8.2*  HCT 27.6* 29.9* 24.2* 24.0*  PLT 300 294 252  --    Coag's Recent Labs  Lab 03/16/2018 0428 03/21/2018 0547 02/25/2018 0742  APTT  --   --  109*  INR >10.00* >10.00*  --    Sepsis Markers Recent Labs  Lab 03/14/2018 0004 03/11/2018 0005 02/23/18 2017 03/22/2018 0428 02-Mar-2018 0435  LATICACIDVEN 4.3*  --  8.61*  --  2.3*  PROCALCITON  --  <0.10  --  0.14  --    ABG No results for input(s): PHART, PCO2ART, PO2ART in the last 168 hours.  Liver Enzymes Recent Labs  Lab 02/23/18 2000 March 02, 2018 0742  AST 42* 187*  ALT 20 92*  ALKPHOS 72 85  BILITOT 1.3* 0.8  ALBUMIN 2.5* 2.5*   Cardiac Enzymes Recent Labs  Lab 02/23/18 0005  TROPONINI 0.19*   Glucose Recent Labs  Lab Mar 02, 2018 0017  GLUCAP 158*   Imaging Dg Chest  Port 1 View  Result Date: 03/20/2018 CLINICAL DATA:  Dyspnea.  Chest pain. EXAM: PORTABLE CHEST 1 VIEW COMPARISON:  Radiographs 01/29/2018 FINDINGS: Mild cardiomegaly which is similar to prior exam. There is aortic atherosclerosis. Unchanged minimal vascular congestion. Small left greater than right pleural effusions, new. No confluent consolidation. No pneumothorax. No acute osseous abnormalities are seen. Surgical clips in the left axilla. IMPRESSION: Small bilateral pleural effusions, new from prior exam. Stable cardiomegaly and vascular congestion. Electronically Signed   By: Jeb Levering M.D.   On: 03/12/2018 21:00   STUDIES:  PFT 09/05/2017 > 09/05/17: FVC 1.20 L (59%) FEV1 0.82 L (52%) FEV1/FVC 0.68 FEF 25-75 0.41 L (29%) negative bronchodilator response TLC 3.50 L  (73%) RV 102% DLCO corrected 50% CXR 3/2 >Mild cardiomegaly which is similar to prior exam. There is aortic atherosclerosis. Unchanged minimal vascular congestion. Small left greater than right pleural effusions, new. No confluent consolidation. No pneumothorax. No acute osseous abnormalities are seen. Surgical clips in the left axilla.  Left/Right Heart Cath 3/2 >  Left Main  Vessel is normal in caliber. Vessel is angiographically normal.  Left Anterior Descending  Vessel is small.  Mid LAD lesion 40% stenosed  Mid LAD lesion is 40% stenosed.  Left Circumflex  Vessel is normal in caliber.  Mid Cx lesion 10% stenosed  Mid Cx lesion is 10% stenosed.  Right Coronary Artery  Vessel is very large.   CULTURES: Blood 3/2 >> Sputum 3/2 >> RVP 3/2 >> U/A 3/2 >>  ANTIBIOTICS: None   SIGNIFICANT EVENTS: 3/2 > Presents to ED   LINES/TUBES: PIV Right Arterial Femoral Sheath 3/2 >>>  Right Venous Femoral Sheath 3/2 >>  DISCUSSION: 77 year old female presents with hypotension, weakness, and tachycardia. LA 8.7. EKG with ST elevation in AVR and V1. Taken to [facility]. Right/Left Heart Cath with nonobstructive CAD, severe pulmonary HTN with intact RV systolic function. Sent to ICU for further work-up post-cath.   ASSESSMENT / PLAN:  PULMONARY A: Acute on Chronic Hypoxic Respiratory Failure in setting of decompensated HF vs Progressive Pulmonary HTN vs viral/bacterial infectious process  Chronic Bronchitis Severe Pulmonary HTN  H/O OSA on CPAP at HS, Asthma   P:   Titrate O2 for sat of 88-92% CPAP QHS RVP pending  Brovana/Pulimort, PRN albuterol  ECHO/LE Dopplers pending (given painful LE edema and dyspnea, patient take pradaxa and states she has not missed a dose would be shocked if this is a DVT)   CARDIOVASCULAR A:  Chest Pain with ST elevation in aVR and V1 s/p Cath  Right/Left Heart Cath nonobstructive CAD, severe PHTN, with intact RV systolic function  Cardiac output 5.9  L/m, cardiac index 3.1 L/m/m, PA Sats 64%, PAPi 2.8 indicating preserved RV systolic function Diastolic HF  Chronic cor pulmonale  A.Fib, HLD, HTN P:  Cardiac Monitoring Cardiology Following  Maintain MAP >65 Trend Troponin but will defer that management to cards Continue Lipitor and ASA  Hold metoprolol until improvement of BP  Pradaxa on hold until Sheath pulled  4 units FFP ordered and f/u INR, if stable will change sheath over a wire to TLC for access for pressors  RENAL A:   Acute Kidney Injury  Crt Baseline 0.9-1.24 Anion Gap Metabolic Acidosis with Lactic Acidosis in setting of hypoperfusion vs sepsis  LA 8.7 >>  P:   Trend BMP Replace electrolytes as indicated  KVO IVF  GASTROINTESTINAL A:   H/O GERD  P:   Heart healthy diabetic diet  Pepcid  HEMATOLOGIC A:   Anemia of Chronic Disease   H/O Breast CA s/p chemo/radiation and lymph node removal in 2005  P:  Trend CBC  Maintain Hbg >7   INFECTIOUS A:   Lactic Acidosis  Afebrile, no WBC This is not an infectious process P:   Trend WBC and Fever Curve  Trend LA and PCT  PAN Culture  Monitor off antibiotics  RVP Pending   ENDOCRINE A:   DM   P:   Trend Glucose  SSI   NEUROLOGIC A:   No issues  P:   Monitor    FAMILY  - Updates: Patient and family updated bedside, DNR status confirmed with patient  - Inter-disciplinary family meet or Palliative Care meeting due by: 03/17/2018   The patient is critically ill with multiple organ systems failure and requires high complexity decision making for assessment and support, frequent evaluation and titration of therapies, application of advanced monitoring technologies and extensive interpretation of multiple databases.   Critical Care Time devoted to patient care services described in this note is  45  Minutes. This time reflects time of care of this signee Dr Jennet Maduro. This critical care time does not reflect procedure time, or teaching time or  supervisory time of PA/NP/Med student/Med Resident etc but could involve care discussion time.  Rush Farmer, M.D. [facility] Pulmonary/Critical Care Medicine. Pager: [phone]. After hours pager: [phone].

## 2018-03-25 NOTE — Progress Notes (Signed)
Received a call back from RN at [place], MD unable to return call due to MD in an emergent situation. Will continue to monitor pt's condition closely.

## 2018-03-25 NOTE — Death Summary Note (Signed)
  77 year old African-American female with hypertension, hyperlipidemia, type II diabetes mellitus,persistent Afib (CHA2DS2VASc score 4), on pradaxa,moderate obesity, severe persistent asthma on home oxygen, WHO group II/III pulmonary hypertension FC II-III, h/o breast cancer.Admitted with chest pain and shortness of breath. Initially thought to have STEMI due to ST elevation in aVR and V1. Cath showed no critical CAD. Patient continued to have lactic acidosis, hypotension requiring high doses of pressors, and severe coagulopathy.Infectious workup negative. Clear etiology of patient's acute decompensation remained unclear.   Prior to this admission, patient has unequivocally expressed her wishes to be DNR, given her severe pulmonary hypertension and comorbidities. This was reversed by the patient for coronary angiography, when STEMI was suspected. However, after the procedure, and on Mar 18, 2018 morning, patient again expressed her wishes to be DNR and not to continue aggressive medical care.   She was made comfort care with measures to ensure patient was in no pain, and in peace. She passed soon thereafter, at 4:07 PM in presence of family and her pastor.  Immediate cause: Severe circulatory shock, severe coagulopathy, multiorgan failure Underlying cause: Severe persistent asthma, severe pulmonary hypertension, cor pulmonale, atrial fibrilltation  Nigel Mormon, MD [facility]. [place] Pager: [phone] Office: [phone] If no answer Cell [phone]

## 2018-03-25 NOTE — Plan of Care (Signed)
With plans for comfort care, referral to [place] made at 1300 by Albertina Senegal, RN. Referral # [id number]. Chaplain at bedside offering patient and family support.

## 2018-03-25 NOTE — Progress Notes (Addendum)
PCCM Interval Note  Patient with progressive hypotension (systolic 20-72) despite fluids. INR >10. 2 units FFP Ordered. STAT CBC ordered. NEO order placed for systolic goal >18.   Lisa Walters, AGACNP-BC [facility]  Pgr: [phone]  PCCM Pgr: [phone]

## 2018-03-25 DEATH — deceased
